# Patient Record
Sex: Male | Born: 1950
Health system: Southern US, Community
[De-identification: ages and names within clinical notes are randomized; demographics above are authoritative.]

## PROBLEM LIST (undated history)

## (undated) DIAGNOSIS — M545 Low back pain, unspecified: Secondary | ICD-10-CM

## (undated) DIAGNOSIS — E7800 Pure hypercholesterolemia, unspecified: Secondary | ICD-10-CM

## (undated) DIAGNOSIS — I255 Ischemic cardiomyopathy: Secondary | ICD-10-CM

## (undated) DIAGNOSIS — C61 Malignant neoplasm of prostate: Secondary | ICD-10-CM

## (undated) DIAGNOSIS — J302 Other seasonal allergic rhinitis: Secondary | ICD-10-CM

## (undated) DIAGNOSIS — Z8489 Family history of other specified conditions: Secondary | ICD-10-CM

## (undated) DIAGNOSIS — R351 Nocturia: Secondary | ICD-10-CM

## (undated) DIAGNOSIS — I5022 Chronic systolic (congestive) heart failure: Secondary | ICD-10-CM

## (undated) DIAGNOSIS — E119 Type 2 diabetes mellitus without complications: Secondary | ICD-10-CM

## (undated) DIAGNOSIS — Z860100 Personal history of colon polyps, unspecified: Secondary | ICD-10-CM

## (undated) DIAGNOSIS — R600 Localized edema: Secondary | ICD-10-CM

## (undated) DIAGNOSIS — D696 Thrombocytopenia, unspecified: Secondary | ICD-10-CM

## (undated) DIAGNOSIS — K5909 Other constipation: Secondary | ICD-10-CM

## (undated) DIAGNOSIS — I10 Essential (primary) hypertension: Secondary | ICD-10-CM

## (undated) DIAGNOSIS — E669 Obesity, unspecified: Secondary | ICD-10-CM

## (undated) DIAGNOSIS — I447 Left bundle-branch block, unspecified: Secondary | ICD-10-CM

## (undated) DIAGNOSIS — K5790 Diverticulosis of intestine, part unspecified, without perforation or abscess without bleeding: Secondary | ICD-10-CM

## (undated) DIAGNOSIS — IMO0001 Reserved for inherently not codable concepts without codable children: Secondary | ICD-10-CM

## (undated) DIAGNOSIS — N183 Chronic kidney disease, stage 3 unspecified: Secondary | ICD-10-CM

## (undated) DIAGNOSIS — G629 Polyneuropathy, unspecified: Secondary | ICD-10-CM

## (undated) DIAGNOSIS — Z95810 Presence of automatic (implantable) cardiac defibrillator: Secondary | ICD-10-CM

## (undated) DIAGNOSIS — Z87442 Personal history of urinary calculi: Secondary | ICD-10-CM

## (undated) DIAGNOSIS — J342 Deviated nasal septum: Secondary | ICD-10-CM

## (undated) DIAGNOSIS — K5730 Diverticulosis of large intestine without perforation or abscess without bleeding: Secondary | ICD-10-CM

## (undated) DIAGNOSIS — Z974 Presence of external hearing-aid: Secondary | ICD-10-CM

## (undated) DIAGNOSIS — I2510 Atherosclerotic heart disease of native coronary artery without angina pectoris: Secondary | ICD-10-CM

## (undated) DIAGNOSIS — N400 Enlarged prostate without lower urinary tract symptoms: Secondary | ICD-10-CM

## (undated) DIAGNOSIS — Z9289 Personal history of other medical treatment: Secondary | ICD-10-CM

## (undated) DIAGNOSIS — M1990 Unspecified osteoarthritis, unspecified site: Secondary | ICD-10-CM

## (undated) DIAGNOSIS — G4733 Obstructive sleep apnea (adult) (pediatric): Secondary | ICD-10-CM

## (undated) DIAGNOSIS — R350 Frequency of micturition: Secondary | ICD-10-CM

## (undated) DIAGNOSIS — G8929 Other chronic pain: Secondary | ICD-10-CM

## (undated) DIAGNOSIS — D171 Benign lipomatous neoplasm of skin and subcutaneous tissue of trunk: Secondary | ICD-10-CM

## (undated) DIAGNOSIS — Z9989 Dependence on other enabling machines and devices: Principal | ICD-10-CM

## (undated) DIAGNOSIS — E782 Mixed hyperlipidemia: Secondary | ICD-10-CM

## (undated) DIAGNOSIS — K5900 Constipation, unspecified: Secondary | ICD-10-CM

## (undated) DIAGNOSIS — N401 Enlarged prostate with lower urinary tract symptoms: Secondary | ICD-10-CM

## (undated) DIAGNOSIS — D751 Secondary polycythemia: Secondary | ICD-10-CM

## (undated) DIAGNOSIS — R609 Edema, unspecified: Secondary | ICD-10-CM

## (undated) DIAGNOSIS — J189 Pneumonia, unspecified organism: Secondary | ICD-10-CM

## (undated) DIAGNOSIS — I351 Nonrheumatic aortic (valve) insufficiency: Secondary | ICD-10-CM

## (undated) DIAGNOSIS — K219 Gastro-esophageal reflux disease without esophagitis: Secondary | ICD-10-CM

## (undated) DIAGNOSIS — J309 Allergic rhinitis, unspecified: Secondary | ICD-10-CM

## (undated) HISTORY — DX: Ischemic cardiomyopathy: I25.5

## (undated) HISTORY — DX: Allergic rhinitis, unspecified: J30.9

## (undated) HISTORY — PX: CARDIAC CATHETERIZATION: SHX172

## (undated) HISTORY — DX: Obstructive sleep apnea (adult) (pediatric): G47.33

## (undated) HISTORY — PX: FRACTURE SURGERY: SHX138

## (undated) HISTORY — DX: Reserved for inherently not codable concepts without codable children: IMO0001

## (undated) HISTORY — DX: Chronic systolic (congestive) heart failure: I50.22

## (undated) HISTORY — DX: Obesity, unspecified: E66.9

## (undated) HISTORY — DX: Essential (primary) hypertension: I10

## (undated) HISTORY — DX: Low back pain, unspecified: M54.50

## (undated) HISTORY — DX: Thrombocytopenia, unspecified: D69.6

## (undated) HISTORY — DX: Left bundle-branch block, unspecified: I44.7

## (undated) HISTORY — DX: Low back pain: M54.5

## (undated) HISTORY — DX: Dependence on other enabling machines and devices: Z99.89

## (undated) HISTORY — DX: Other chronic pain: G89.29

## (undated) HISTORY — DX: Pure hypercholesterolemia, unspecified: E78.00

## (undated) HISTORY — DX: Atherosclerotic heart disease of native coronary artery without angina pectoris: I25.10

## (undated) HISTORY — PX: COLONOSCOPY: SHX174

## (undated) HISTORY — DX: Benign lipomatous neoplasm of skin and subcutaneous tissue of trunk: D17.1

---

## 1999-05-08 ENCOUNTER — Encounter: Payer: Self-pay | Admitting: *Deleted

## 1999-05-08 ENCOUNTER — Emergency Department (HOSPITAL_COMMUNITY): Admission: EM | Admit: 1999-05-08 | Discharge: 1999-05-08 | Payer: Self-pay | Admitting: Emergency Medicine

## 2000-01-13 ENCOUNTER — Encounter: Payer: Self-pay | Admitting: Emergency Medicine

## 2000-01-13 ENCOUNTER — Encounter: Admission: RE | Admit: 2000-01-13 | Discharge: 2000-01-13 | Payer: Self-pay | Admitting: Emergency Medicine

## 2000-01-16 ENCOUNTER — Encounter: Payer: Self-pay | Admitting: Emergency Medicine

## 2000-01-16 ENCOUNTER — Encounter: Admission: RE | Admit: 2000-01-16 | Discharge: 2000-01-16 | Payer: Self-pay | Admitting: Emergency Medicine

## 2000-11-12 ENCOUNTER — Encounter: Admission: RE | Admit: 2000-11-12 | Discharge: 2001-02-10 | Payer: Self-pay | Admitting: Emergency Medicine

## 2001-01-23 ENCOUNTER — Encounter: Payer: Self-pay | Admitting: Emergency Medicine

## 2001-01-23 ENCOUNTER — Encounter: Admission: RE | Admit: 2001-01-23 | Discharge: 2001-01-23 | Payer: Self-pay | Admitting: Emergency Medicine

## 2008-02-14 HISTORY — PX: TIBIA FRACTURE SURGERY: SHX806

## 2008-02-14 HISTORY — PX: HUMERUS FRACTURE SURGERY: SHX670

## 2008-10-16 ENCOUNTER — Inpatient Hospital Stay (HOSPITAL_COMMUNITY): Admission: EM | Admit: 2008-10-16 | Discharge: 2008-10-28 | Payer: Self-pay

## 2008-10-16 HISTORY — PX: ORIF TIBIA PLATEAU: SHX2132

## 2008-10-20 ENCOUNTER — Ambulatory Visit: Payer: Self-pay | Admitting: Physical Medicine & Rehabilitation

## 2010-05-20 LAB — GLUCOSE, CAPILLARY
Glucose-Capillary: 102 mg/dL — ABNORMAL HIGH (ref 70–99)
Glucose-Capillary: 103 mg/dL — ABNORMAL HIGH (ref 70–99)
Glucose-Capillary: 103 mg/dL — ABNORMAL HIGH (ref 70–99)
Glucose-Capillary: 103 mg/dL — ABNORMAL HIGH (ref 70–99)
Glucose-Capillary: 105 mg/dL — ABNORMAL HIGH (ref 70–99)
Glucose-Capillary: 108 mg/dL — ABNORMAL HIGH (ref 70–99)
Glucose-Capillary: 108 mg/dL — ABNORMAL HIGH (ref 70–99)
Glucose-Capillary: 108 mg/dL — ABNORMAL HIGH (ref 70–99)
Glucose-Capillary: 109 mg/dL — ABNORMAL HIGH (ref 70–99)
Glucose-Capillary: 109 mg/dL — ABNORMAL HIGH (ref 70–99)
Glucose-Capillary: 110 mg/dL — ABNORMAL HIGH (ref 70–99)
Glucose-Capillary: 113 mg/dL — ABNORMAL HIGH (ref 70–99)
Glucose-Capillary: 113 mg/dL — ABNORMAL HIGH (ref 70–99)
Glucose-Capillary: 113 mg/dL — ABNORMAL HIGH (ref 70–99)
Glucose-Capillary: 115 mg/dL — ABNORMAL HIGH (ref 70–99)
Glucose-Capillary: 116 mg/dL — ABNORMAL HIGH (ref 70–99)
Glucose-Capillary: 120 mg/dL — ABNORMAL HIGH (ref 70–99)
Glucose-Capillary: 120 mg/dL — ABNORMAL HIGH (ref 70–99)
Glucose-Capillary: 123 mg/dL — ABNORMAL HIGH (ref 70–99)
Glucose-Capillary: 124 mg/dL — ABNORMAL HIGH (ref 70–99)
Glucose-Capillary: 125 mg/dL — ABNORMAL HIGH (ref 70–99)
Glucose-Capillary: 127 mg/dL — ABNORMAL HIGH (ref 70–99)
Glucose-Capillary: 132 mg/dL — ABNORMAL HIGH (ref 70–99)
Glucose-Capillary: 133 mg/dL — ABNORMAL HIGH (ref 70–99)
Glucose-Capillary: 146 mg/dL — ABNORMAL HIGH (ref 70–99)
Glucose-Capillary: 72 mg/dL (ref 70–99)
Glucose-Capillary: 85 mg/dL (ref 70–99)
Glucose-Capillary: 95 mg/dL (ref 70–99)
Glucose-Capillary: 97 mg/dL (ref 70–99)

## 2010-05-20 LAB — CBC
HCT: 38.1 % — ABNORMAL LOW (ref 39.0–52.0)
HCT: 38.7 % — ABNORMAL LOW (ref 39.0–52.0)
HCT: 46.3 % (ref 39.0–52.0)
Hemoglobin: 12.9 g/dL — ABNORMAL LOW (ref 13.0–17.0)
MCHC: 33.9 g/dL (ref 30.0–36.0)
MCHC: 34.7 g/dL (ref 30.0–36.0)
MCV: 85.9 fL (ref 78.0–100.0)
Platelets: 114 10*3/uL — ABNORMAL LOW (ref 150–400)
Platelets: 120 10*3/uL — ABNORMAL LOW (ref 150–400)
Platelets: 135 10*3/uL — ABNORMAL LOW (ref 150–400)
Platelets: 165 10*3/uL (ref 150–400)
RBC: 4.32 MIL/uL (ref 4.22–5.81)
RBC: 5.39 MIL/uL (ref 4.22–5.81)
RDW: 14.2 % (ref 11.5–15.5)
RDW: 14.3 % (ref 11.5–15.5)
RDW: 14.6 % (ref 11.5–15.5)
RDW: 14.9 % (ref 11.5–15.5)
WBC: 7.8 10*3/uL (ref 4.0–10.5)

## 2010-05-20 LAB — BASIC METABOLIC PANEL
BUN: 12 mg/dL (ref 6–23)
BUN: 9 mg/dL (ref 6–23)
CO2: 24 mEq/L (ref 19–32)
Calcium: 8.2 mg/dL — ABNORMAL LOW (ref 8.4–10.5)
Calcium: 8.6 mg/dL (ref 8.4–10.5)
Calcium: 8.8 mg/dL (ref 8.4–10.5)
Chloride: 103 mEq/L (ref 96–112)
Creatinine, Ser: 0.89 mg/dL (ref 0.4–1.5)
Creatinine, Ser: 1.12 mg/dL (ref 0.4–1.5)
GFR calc Af Amer: >60 mL/min (ref >60)
GFR calc non Af Amer: >60 mL/min (ref >60)
GFR calc non Af Amer: >60 mL/min (ref >60)
GFR calc non Af Amer: >60 mL/min (ref >60)
Glucose, Bld: 109 mg/dL — ABNORMAL HIGH (ref 70–99)
Glucose, Bld: 122 mg/dL — ABNORMAL HIGH (ref 70–99)
Glucose, Bld: 126 mg/dL — ABNORMAL HIGH (ref 70–99)
Potassium: 3.4 mEq/L — ABNORMAL LOW (ref 3.5–5.1)
Potassium: 3.4 mEq/L — ABNORMAL LOW (ref 3.5–5.1)
Sodium: 141 mEq/L (ref 135–145)

## 2010-05-20 LAB — URINALYSIS, ROUTINE W REFLEX MICROSCOPIC
Bilirubin Urine: NEGATIVE
Bilirubin Urine: NEGATIVE
Glucose, UA: NEGATIVE mg/dL
Hgb urine dipstick: NEGATIVE
Hgb urine dipstick: NEGATIVE
Protein, ur: NEGATIVE mg/dL
Specific Gravity, Urine: 1.023 (ref 1.005–1.030)
Urobilinogen, UA: 0.2 mg/dL (ref 0.0–1.0)
Urobilinogen, UA: 0.2 mg/dL (ref 0.0–1.0)

## 2010-05-20 LAB — HEMOGLOBIN A1C: Hgb A1c MFr Bld: 6.1 % (ref 4.6–6.1)

## 2010-05-20 LAB — POCT I-STAT, CHEM 8
BUN: 19 mg/dL (ref 6–23)
Creatinine, Ser: 1.2 mg/dL (ref 0.4–1.5)
Glucose, Bld: 121 mg/dL — ABNORMAL HIGH (ref 70–99)
Hemoglobin: 16.3 g/dL (ref 13.0–17.0)
Potassium: 3.3 meq/L — ABNORMAL LOW (ref 3.5–5.1)

## 2010-05-20 LAB — POCT I-STAT 4, (NA,K, GLUC, HGB,HCT): Hemoglobin: 14.6 g/dL (ref 13.0–17.0)

## 2010-05-20 LAB — SAMPLE TO BLOOD BANK

## 2010-05-20 LAB — LACTIC ACID, PLASMA: Lactic Acid, Venous: 1.8 mmol/L (ref 0.5–2.2)

## 2011-02-14 HISTORY — PX: KNEE ARTHROSCOPY: SHX127

## 2012-08-14 ENCOUNTER — Other Ambulatory Visit: Payer: Self-pay | Admitting: Interventional Cardiology

## 2012-08-15 ENCOUNTER — Encounter (HOSPITAL_COMMUNITY)
Admission: RE | Disposition: A | Discharge status: Home / Self Care | Payer: Self-pay | Source: Ambulatory Visit | Attending: Interventional Cardiology

## 2012-08-15 ENCOUNTER — Encounter (HOSPITAL_BASED_OUTPATIENT_CLINIC_OR_DEPARTMENT_OTHER): Admission: RE | Payer: Self-pay | Source: Ambulatory Visit | Attending: Interventional Cardiology

## 2012-08-15 ENCOUNTER — Inpatient Hospital Stay (HOSPITAL_BASED_OUTPATIENT_CLINIC_OR_DEPARTMENT_OTHER)
Admission: RE | Admit: 2012-08-15 | Discharge: 2012-08-15 | Disposition: A | Discharge status: Other Acute Inpatient Hospital | Payer: BC Managed Care – PPO | Source: Ambulatory Visit | Attending: Interventional Cardiology | Admitting: Interventional Cardiology

## 2012-08-15 ENCOUNTER — Ambulatory Visit (HOSPITAL_COMMUNITY)
Admission: RE | Admit: 2012-08-15 | Discharge: 2012-08-15 | Disposition: A | Discharge status: Home / Self Care | Payer: BC Managed Care – PPO | Source: Ambulatory Visit | Attending: Interventional Cardiology | Admitting: Interventional Cardiology

## 2012-08-15 ENCOUNTER — Encounter (HOSPITAL_COMMUNITY): Payer: Self-pay | Admitting: Pharmacy Technician

## 2012-08-15 DIAGNOSIS — I2510 Atherosclerotic heart disease of native coronary artery without angina pectoris: Secondary | ICD-10-CM | POA: Insufficient documentation

## 2012-08-15 DIAGNOSIS — E785 Hyperlipidemia, unspecified: Secondary | ICD-10-CM | POA: Insufficient documentation

## 2012-08-15 DIAGNOSIS — R9439 Abnormal result of other cardiovascular function study: Secondary | ICD-10-CM

## 2012-08-15 DIAGNOSIS — I10 Essential (primary) hypertension: Secondary | ICD-10-CM | POA: Insufficient documentation

## 2012-08-15 DIAGNOSIS — E119 Type 2 diabetes mellitus without complications: Secondary | ICD-10-CM | POA: Insufficient documentation

## 2012-08-15 DIAGNOSIS — I519 Heart disease, unspecified: Secondary | ICD-10-CM | POA: Insufficient documentation

## 2012-08-15 DIAGNOSIS — Z8249 Family history of ischemic heart disease and other diseases of the circulatory system: Secondary | ICD-10-CM | POA: Insufficient documentation

## 2012-08-15 DIAGNOSIS — I5022 Chronic systolic (congestive) heart failure: Secondary | ICD-10-CM | POA: Diagnosis present

## 2012-08-15 DIAGNOSIS — E669 Obesity, unspecified: Secondary | ICD-10-CM | POA: Insufficient documentation

## 2012-08-15 HISTORY — PX: LEFT HEART CATHETERIZATION WITH CORONARY ANGIOGRAM: SHX5451

## 2012-08-15 LAB — GLUCOSE, CAPILLARY: Glucose-Capillary: 125 mg/dL — ABNORMAL HIGH (ref 70–99)

## 2012-08-15 SURGERY — LEFT HEART CATHETERIZATION WITH CORONARY ANGIOGRAM
Anesthesia: LOCAL

## 2012-08-15 SURGERY — JV LEFT HEART CATHETERIZATION WITH CORONARY ANGIOGRAM
Anesthesia: Moderate Sedation

## 2012-08-15 MED ORDER — NITROGLYCERIN 0.2 MG/ML ON CALL CATH LAB
INTRAVENOUS | Status: AC
  Filled 2012-08-15: qty 1

## 2012-08-15 MED ORDER — VERAPAMIL HCL 2.5 MG/ML IV SOLN
INTRAVENOUS | Status: AC
  Filled 2012-08-15: qty 2

## 2012-08-15 MED ORDER — ONDANSETRON HCL 4 MG/2ML IJ SOLN: 4.0000 mg | Freq: Four times a day (QID) | INTRAMUSCULAR | Status: DC | PRN

## 2012-08-15 MED ORDER — MIDAZOLAM HCL 2 MG/2ML IJ SOLN
INTRAMUSCULAR | Status: AC
  Filled 2012-08-15: qty 2

## 2012-08-15 MED ORDER — SODIUM CHLORIDE 0.9 % IV SOLN
INTRAVENOUS | Status: DC
  Administered 2012-08-15: 09:00:00 via INTRAVENOUS

## 2012-08-15 MED ORDER — LISINOPRIL 20 MG PO TABS: 20.0000 mg | ORAL_TABLET | Freq: Every day | ORAL | Status: DC

## 2012-08-15 MED ORDER — FENTANYL CITRATE 0.05 MG/ML IJ SOLN
INTRAMUSCULAR | Status: AC
  Filled 2012-08-15: qty 2

## 2012-08-15 MED ORDER — SODIUM CHLORIDE 0.9 % IJ SOLN: 3.0000 mL | INTRAMUSCULAR | Status: DC | PRN

## 2012-08-15 MED ORDER — ASPIRIN 81 MG PO CHEW
CHEWABLE_TABLET | ORAL | Status: AC
  Filled 2012-08-15: qty 4

## 2012-08-15 MED ORDER — ASPIRIN 81 MG PO CHEW
324.0000 mg | CHEWABLE_TABLET | ORAL | Status: AC
  Administered 2012-08-15: 324 mg via ORAL

## 2012-08-15 MED ORDER — OXYCODONE-ACETAMINOPHEN 5-325 MG PO TABS: 1.0000 | ORAL_TABLET | ORAL | Status: DC | PRN

## 2012-08-15 MED ORDER — LISINOPRIL 20 MG PO TABS
20.0000 mg | ORAL_TABLET | Freq: Every day | ORAL | Status: DC
  Filled 2012-08-15: qty 1

## 2012-08-15 MED ORDER — HEPARIN SODIUM (PORCINE) 1000 UNIT/ML IJ SOLN
INTRAMUSCULAR | Status: AC
  Filled 2012-08-15: qty 1

## 2012-08-15 MED ORDER — LIDOCAINE HCL (PF) 1 % IJ SOLN
INTRAMUSCULAR | Status: AC
  Filled 2012-08-15: qty 30

## 2012-08-15 MED ORDER — ACETAMINOPHEN 325 MG PO TABS: 650.0000 mg | ORAL_TABLET | ORAL | Status: DC | PRN

## 2012-08-15 MED ORDER — HEPARIN (PORCINE) IN NACL 2-0.9 UNIT/ML-% IJ SOLN
INTRAMUSCULAR | Status: AC
  Filled 2012-08-15: qty 1000

## 2012-08-15 MED ORDER — DIAZEPAM 5 MG PO TABS
ORAL_TABLET | ORAL | Status: AC
  Filled 2012-08-15: qty 1

## 2012-08-15 MED ORDER — METOPROLOL SUCCINATE ER 25 MG PO TB24: 25.0000 mg | ORAL_TABLET | Freq: Every day | ORAL | Status: DC

## 2012-08-15 MED ORDER — SODIUM CHLORIDE 0.9 % IV SOLN: INTRAVENOUS | Status: DC

## 2012-08-15 MED ORDER — SODIUM CHLORIDE 0.9 % IV SOLN: 250.0000 mL | INTRAVENOUS | Status: DC | PRN

## 2012-08-15 MED ORDER — SODIUM CHLORIDE 0.9 % IJ SOLN: 3.0000 mL | Freq: Two times a day (BID) | INTRAMUSCULAR | Status: DC

## 2012-08-15 MED ORDER — DIAZEPAM 5 MG PO TABS
5.0000 mg | ORAL_TABLET | ORAL | Status: AC
  Administered 2012-08-15: 5 mg via ORAL

## 2012-08-15 MED ORDER — POTASSIUM CHLORIDE CRYS ER 20 MEQ PO TBCR: 20.0000 meq | EXTENDED_RELEASE_TABLET | Freq: Every day | ORAL | Status: DC

## 2012-08-15 NOTE — H&P (Signed)
The patient is 62 years of age and is a 3-6 month history of exertional chest tightness and dyspnea. A myocardial perfusion study done within the past 10 days demonstrated an intermediate risk to high risk study with anterior and inferior infarct/ischemia and a reduced EF of 30-35%. The patient is limited by angina with class III symptoms. Is not on good antianginal therapy. He is diabetic, hypertensive, obese, hyperlipidemic, and has a family history of CAD.  After considering the nuclear study and the patient's symptoms were decided to perform coronary angiography to define anatomy.  Please see the patient's complete history and physical scan done from the records at [facility] cardiology.  The procedure and its risks including stroke, death, limb ischemia, myocardial infarction, emergency surgery, kidney injury, bleeding, allergy, among others were discussed in detail except above the patient.

## 2012-08-15 NOTE — CV Procedure (Signed)
Cath Lab Visit (complete for each Cath Lab visit)  Clinical Evaluation Leading to the Procedure:   ACS: no  Non-ACS:    Anginal Classification: CCS III  Anti-ischemic medical therapy: Minimal Therapy (1 class of medications)  Non-Invasive Test Results: Intermediate-risk stress test findings: cardiac mortality 1-3%/year  Prior CABG: No previous CABG

## 2012-08-15 NOTE — CV Procedure (Signed)
     Diagnostic Cardiac Catheterization Report  Kenneth Giles  62 y.o.  male 09-Jul-1950  Procedure Date:08/15/2012 Referring Physician: Merlene Laughter, M.D.  Primary Cardiologist::  HWB Leia Alf, M.D.  PROCEDURE:  Left heart catheterization with selective coronary angiography, left ventriculogram.  INDICATIONS:  High-risk myocardial perfusion study including decreased LV function and multizone ischemia. Class III angina.  The risks, benefits, and details of the procedure were explained to the patient.  The patient verbalized understanding and wanted to proceed.  Informed written consent was obtained.  PROCEDURE TECHNIQUE:  After Xylocaine anesthesia a 5 [place] Skin he sheath was placed in the right radial artery with a single anterior needle wall stick.   Coronary angiography was done using a 5 [place] JL 3.5 and 5 [place] JR 4 catheter.  Left ventriculography was done using 5 [place] JR 4 catheter.  Hand injection was performed.   CONTRAST:  Total of 110 cc.  COMPLICATIONS:  None.    HEMODYNAMICS:  Aortic pressure was 154/74 mmHg; LV pressure was 156/4 mmHg; LVEDP 26 mmHg.  There was no gradient between the left ventricle and aorta.    ANGIOGRAPHIC DATA:   The left main coronary artery is normal.  The left anterior descending artery is 85-90% proximal LAD before bifurcating into the second diagonal and continuation of the LAD. The distal territory beyond this Monday in the 1, 0, 0 stenosis is large.  The left circumflex artery is 80-90%.diffuse stenosis in the mid vessel before the origin of the first and second obtuse marginal branches which supply the inferolateral wall and are relatively small in diameter. May or may not be graftable.  The ramus intermedius artery contains proximal eccentric 65-75% stenosis just beyond its origin from the distal left main. This is a large vessel that extends down the anterolateral wall. The vessel is graftable..  The right coronary artery is  moderate in size. He gives origin to a large acute right ventricular branch. Several small distal branches arise after a small PDA origin. The vessel contains high-grade 70-90% obstruction in a segmental distribution within the mid vessel just above the bifurcation of the RCA into the acute marginal branch.[name]  LEFT VENTRICULOGRAM:  Left ventricular angiogram was done in the 30 RAO projection and revealed a dilated cavity with global hypokinesis with an estimated ejection fraction of 30% %.  LVEDP was 26 mmHg mmHg.  IMPRESSIONS:  1. Severe three-vessel coronary artery disease involving the proximal LAD, ramus intermedius, mid circumflex, and mid RCA.  2. Severe left ventricular dysfunction disproportionate to the severity of coronary disease although all territories are supplied by obstructed vessels. My suspicion is that there may be some nonischemic cardiomyopathic involvement.  3. Significant elevation of left ventricular end-diastolic pressure.   RECOMMENDATION:  TCT S. consultation. Anti-ischemic and heart failure therapy will be initiated. The patient will be discharged as an outpatient today but needs to see the surgeon within the next 5 days.[name]

## 2012-08-19 ENCOUNTER — Other Ambulatory Visit: Payer: Self-pay | Admitting: *Deleted

## 2012-08-19 ENCOUNTER — Encounter: Payer: BC Managed Care – PPO | Admitting: Surgery

## 2012-08-19 ENCOUNTER — Institutional Professional Consult (permissible substitution) (INDEPENDENT_AMBULATORY_CARE_PROVIDER_SITE_OTHER): Payer: BC Managed Care – PPO | Admitting: Cardiothoracic Surgery

## 2012-08-19 VITALS — BP 159/80 | HR 74 | Resp 20 | Ht 66.0 in | Wt 240.0 lb

## 2012-08-19 DIAGNOSIS — E119 Type 2 diabetes mellitus without complications: Secondary | ICD-10-CM

## 2012-08-19 DIAGNOSIS — I2510 Atherosclerotic heart disease of native coronary artery without angina pectoris: Secondary | ICD-10-CM | POA: Insufficient documentation

## 2012-08-19 DIAGNOSIS — I10 Essential (primary) hypertension: Secondary | ICD-10-CM

## 2012-08-19 NOTE — Patient Instructions (Signed)
Stop fish oil now Take metoprolol with sip of water on a.m. of surgery Stop glipizide last dose Wednesday, July 9
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
## 2012-08-21 ENCOUNTER — Encounter (HOSPITAL_COMMUNITY): Payer: Self-pay

## 2012-08-21 ENCOUNTER — Ambulatory Visit (HOSPITAL_COMMUNITY)
Admission: RE | Admit: 2012-08-21 | Discharge: 2012-08-21 | Disposition: A | Discharge status: Home / Self Care | Payer: BC Managed Care – PPO | Source: Ambulatory Visit | Attending: Cardiothoracic Surgery | Admitting: Cardiothoracic Surgery

## 2012-08-21 ENCOUNTER — Encounter (HOSPITAL_COMMUNITY)
Admission: RE | Admit: 2012-08-21 | Discharge: 2012-08-21 | Disposition: A | Discharge status: Home / Self Care | Payer: BC Managed Care – PPO | Source: Ambulatory Visit | Attending: Cardiothoracic Surgery | Admitting: Cardiothoracic Surgery

## 2012-08-21 VITALS — BP 168/81 | HR 63 | Temp 98.8°F | Resp 20 | Ht 67.0 in | Wt 244.6 lb

## 2012-08-21 DIAGNOSIS — Z01812 Encounter for preprocedural laboratory examination: Secondary | ICD-10-CM | POA: Insufficient documentation

## 2012-08-21 DIAGNOSIS — Z01810 Encounter for preprocedural cardiovascular examination: Secondary | ICD-10-CM

## 2012-08-21 DIAGNOSIS — I2510 Atherosclerotic heart disease of native coronary artery without angina pectoris: Secondary | ICD-10-CM | POA: Insufficient documentation

## 2012-08-21 DIAGNOSIS — Z01818 Encounter for other preprocedural examination: Secondary | ICD-10-CM | POA: Insufficient documentation

## 2012-08-21 HISTORY — DX: Localized edema: R60.0

## 2012-08-21 HISTORY — DX: Personal history of colonic polyps: Z86.010

## 2012-08-21 HISTORY — DX: Nocturia: R35.1

## 2012-08-21 HISTORY — DX: Benign prostatic hyperplasia without lower urinary tract symptoms: N40.0

## 2012-08-21 HISTORY — DX: Unspecified osteoarthritis, unspecified site: M19.90

## 2012-08-21 HISTORY — DX: Pneumonia, unspecified organism: J18.9

## 2012-08-21 HISTORY — DX: Diverticulosis of intestine, part unspecified, without perforation or abscess without bleeding: K57.90

## 2012-08-21 HISTORY — DX: Edema, unspecified: R60.9

## 2012-08-21 HISTORY — DX: Frequency of micturition: R35.0

## 2012-08-21 HISTORY — DX: Personal history of colon polyps, unspecified: Z86.0100

## 2012-08-21 HISTORY — DX: Polyneuropathy, unspecified: G62.9

## 2012-08-21 HISTORY — DX: Gastro-esophageal reflux disease without esophagitis: K21.9

## 2012-08-21 HISTORY — DX: Personal history of other medical treatment: Z92.89

## 2012-08-21 HISTORY — DX: Constipation, unspecified: K59.00

## 2012-08-21 LAB — URINALYSIS, ROUTINE W REFLEX MICROSCOPIC
Bilirubin Urine: NEGATIVE
Glucose, UA: 250 mg/dL — AB
Hgb urine dipstick: NEGATIVE
Ketones, ur: NEGATIVE mg/dL
Leukocytes, UA: NEGATIVE
Nitrite: NEGATIVE
Protein, ur: NEGATIVE mg/dL
Specific Gravity, Urine: 1.022 (ref 1.005–1.030)
Urobilinogen, UA: 1 mg/dL (ref 0.0–1.0)
pH: 5.5 (ref 5.0–8.0)

## 2012-08-21 LAB — BLOOD GAS, ARTERIAL
Acid-Base Excess: 0.2 mmol/L (ref 0.0–2.0)
Bicarbonate: 24.4 mEq/L — ABNORMAL HIGH (ref 20.0–24.0)
Drawn by: 344381
FIO2: 0.21 %
O2 Saturation: 98.3 %
Patient temperature: 98.6
TCO2: 25.6 mmol/L (ref 0–100)
pCO2 arterial: 39.7 mmHg (ref 35.0–45.0)
pH, Arterial: 7.405 (ref 7.350–7.450)
pO2, Arterial: 90.8 mmHg (ref 80.0–100.0)

## 2012-08-21 LAB — CBC
HCT: 44.2 % (ref 39.0–52.0)
Hemoglobin: 16 g/dL (ref 13.0–17.0)
MCH: 30.2 pg (ref 26.0–34.0)
MCHC: 36.2 g/dL — ABNORMAL HIGH (ref 30.0–36.0)
MCV: 83.4 fL (ref 78.0–100.0)
Platelets: 105 10*3/uL — ABNORMAL LOW (ref 150–400)
RBC: 5.3 MIL/uL (ref 4.22–5.81)
RDW: 13.6 % (ref 11.5–15.5)
WBC: 4.1 10*3/uL (ref 4.0–10.5)

## 2012-08-21 LAB — COMPREHENSIVE METABOLIC PANEL
ALT: 36 U/L (ref 0–53)
AST: 29 U/L (ref 0–37)
Albumin: 3.6 g/dL (ref 3.5–5.2)
Alkaline Phosphatase: 55 U/L (ref 39–117)
BUN: 13 mg/dL (ref 6–23)
CO2: 20 mEq/L (ref 19–32)
Calcium: 9.3 mg/dL (ref 8.4–10.5)
Chloride: 105 mEq/L (ref 96–112)
Creatinine, Ser: 0.85 mg/dL (ref 0.50–1.35)
GFR calc Af Amer: >90 mL/min (ref >90)
GFR calc non Af Amer: >90 mL/min (ref >90)
Glucose, Bld: 221 mg/dL — ABNORMAL HIGH (ref 70–99)
Potassium: 3.5 mEq/L (ref 3.5–5.1)
Sodium: 137 mEq/L (ref 135–145)
Total Bilirubin: 0.4 mg/dL (ref 0.3–1.2)
Total Protein: 6.6 g/dL (ref 6.0–8.3)

## 2012-08-21 LAB — ABO/RH: ABO/RH(D): A POS

## 2012-08-21 LAB — TYPE AND SCREEN
ABO/RH(D): A POS
Antibody Screen: NEGATIVE

## 2012-08-21 LAB — APTT: aPTT: 28 seconds (ref 24–37)

## 2012-08-21 LAB — HEMOGLOBIN A1C
Hgb A1c MFr Bld: 6.2 % — ABNORMAL HIGH (ref <5.7)
Mean Plasma Glucose: 131 mg/dL — ABNORMAL HIGH (ref <117)

## 2012-08-21 LAB — SURGICAL PCR SCREEN
MRSA, PCR: NEGATIVE
Staphylococcus aureus: NEGATIVE

## 2012-08-21 LAB — PROTIME-INR
INR: 0.98 (ref 0.00–1.49)
Prothrombin Time: 12.8 seconds (ref 11.6–15.2)

## 2012-08-21 MED ORDER — CHLORHEXIDINE GLUCONATE 4 % EX LIQD: 30.0000 mL | CUTANEOUS | Status: DC

## 2012-08-21 NOTE — Progress Notes (Signed)
VASCULAR LAB PRELIMINARY  PRELIMINARY  PRELIMINARY  PRELIMINARY  Pre-op Cardiac Surgery  Carotid Findings:  Bilateral:  Less than 39% ICA stenosis.  Vertebral artery flow is antegrade.      Upper Extremity Right Left  Brachial Pressures 174 Triphasic  177  Triphasic   Radial Waveforms Triphasic  Triphasic   Ulnar Waveforms Triphasic  Triphasic   Palmar Arch (Allen's Test) Within normal limits  Within normal limits     Lower  Extremity Right Left  Dorsalis Pedis 301  Biphasic  235  Biphasic   Anterior Tibial    Posterior Tibial 241  Biphasic  247  Biphasic   Ankle/Brachial Indices 1.7 1.4      ABIs may be falsely elevated.   Kenneth Giles, RVT 08/21/2012, 10:30 AM

## 2012-08-21 NOTE — Progress Notes (Signed)
08/21/12 0829  OBSTRUCTIVE SLEEP APNEA  Have you ever been diagnosed with sleep apnea through a sleep study? No  Do you snore loudly (loud enough to be heard through closed doors)?  0  Do you often feel tired, fatigued, or sleepy during the daytime? 0  Has anyone observed you stop breathing during your sleep? 0  Do you have, or are you being treated for high blood pressure? 1  BMI more than 35 kg/m2? 1  Age over 60 years old? 1  Neck circumference greater than 40 cm/18 inches? 1 (18 1/2)  Gender: 1  Obstructive Sleep Apnea Score 5  Score 4 or greater  Results sent to PCP

## 2012-08-21 NOTE — Progress Notes (Addendum)
Dr.Henry Katrinka Blazing is cardiologist-to request last office visit from last week and last ekg for comparisson  Stress test report in epic from 08-19-12  Heart cath report in epic from 08-15-12  Denies ever having an echo  Dr.Stoneking is Medical Md  Denies EKG or CXR within time frame needed for surgery  Dopplers to be done at 0900  PFT to be done on 08-22-12 at 1130

## 2012-08-21 NOTE — Pre-Procedure Instructions (Signed)
Kenneth Giles  08/21/2012   Your procedure is scheduled on:  Fri, July 11 @ 7:30 AM  Report to [facility] at 5:30 AM.  Call this number if you have problems the morning of surgery: [phone]   Remember:   Do not eat food or drink liquids after midnight.   Take these medicines the morning of surgery with A SIP OF WATER: Zyrtec(Cetirizine),Metoprolol(Toprol),and Omeprazole(Prilosec)               Stop taking your Fish Oil.No Goody's,Aleve,Ibuprofen,BC's,or any Herbal Medications   Do not wear jewelry.  Do not wear lotions, powders, or colognes. You may wear deodorant.  Men may shave face and neck.  Do not bring valuables to the hospital.  [facility] is not responsible                   for any belongings or valuables.  Contacts, dentures or bridgework may not be worn into surgery.  Leave suitcase in the car. After surgery it may be brought to your room.  For patients admitted to the hospital, checkout time is 11:00 AM the day of  discharge.   Special Instructions: Shower using CHG 2 nights before surgery and the night before surgery.  If you shower the day of surgery use CHG.  Use special wash - you have one bottle of CHG for all showers.  You should use approximately 1/3 of the bottle for each shower.   Please read over the following fact sheets that you were given: Pain Booklet, Coughing and Deep Breathing, Blood Transfusion Information, MRSA Information and Surgical Site Infection Prevention

## 2012-08-22 ENCOUNTER — Ambulatory Visit (HOSPITAL_COMMUNITY)
Admission: RE | Admit: 2012-08-22 | Discharge: 2012-08-22 | Disposition: A | Discharge status: Home / Self Care | Payer: BC Managed Care – PPO | Source: Ambulatory Visit | Attending: Cardiothoracic Surgery | Admitting: Cardiothoracic Surgery

## 2012-08-22 DIAGNOSIS — I447 Left bundle-branch block, unspecified: Secondary | ICD-10-CM | POA: Diagnosis present

## 2012-08-22 DIAGNOSIS — Z0271 Encounter for disability determination: Secondary | ICD-10-CM

## 2012-08-22 DIAGNOSIS — G8929 Other chronic pain: Secondary | ICD-10-CM | POA: Diagnosis present

## 2012-08-22 DIAGNOSIS — D62 Acute posthemorrhagic anemia: Secondary | ICD-10-CM | POA: Diagnosis not present

## 2012-08-22 DIAGNOSIS — I10 Essential (primary) hypertension: Secondary | ICD-10-CM | POA: Diagnosis present

## 2012-08-22 DIAGNOSIS — I2510 Atherosclerotic heart disease of native coronary artery without angina pectoris: Principal | ICD-10-CM | POA: Diagnosis present

## 2012-08-22 DIAGNOSIS — E8779 Other fluid overload: Secondary | ICD-10-CM | POA: Diagnosis not present

## 2012-08-22 DIAGNOSIS — J9819 Other pulmonary collapse: Secondary | ICD-10-CM | POA: Diagnosis not present

## 2012-08-22 DIAGNOSIS — I498 Other specified cardiac arrhythmias: Secondary | ICD-10-CM | POA: Diagnosis not present

## 2012-08-22 DIAGNOSIS — G609 Hereditary and idiopathic neuropathy, unspecified: Secondary | ICD-10-CM | POA: Diagnosis present

## 2012-08-22 DIAGNOSIS — E7800 Pure hypercholesterolemia, unspecified: Secondary | ICD-10-CM | POA: Diagnosis present

## 2012-08-22 DIAGNOSIS — K219 Gastro-esophageal reflux disease without esophagitis: Secondary | ICD-10-CM | POA: Diagnosis present

## 2012-08-22 DIAGNOSIS — J309 Allergic rhinitis, unspecified: Secondary | ICD-10-CM | POA: Diagnosis present

## 2012-08-22 DIAGNOSIS — Z01818 Encounter for other preprocedural examination: Secondary | ICD-10-CM

## 2012-08-22 DIAGNOSIS — E119 Type 2 diabetes mellitus without complications: Secondary | ICD-10-CM | POA: Diagnosis present

## 2012-08-22 DIAGNOSIS — M549 Dorsalgia, unspecified: Secondary | ICD-10-CM | POA: Diagnosis present

## 2012-08-22 DIAGNOSIS — E669 Obesity, unspecified: Secondary | ICD-10-CM | POA: Diagnosis present

## 2012-08-22 DIAGNOSIS — Z01810 Encounter for preprocedural cardiovascular examination: Secondary | ICD-10-CM

## 2012-08-22 DIAGNOSIS — Z01812 Encounter for preprocedural laboratory examination: Secondary | ICD-10-CM

## 2012-08-22 LAB — PULMONARY FUNCTION TEST

## 2012-08-22 MED ORDER — SODIUM CHLORIDE 0.9 % IV SOLN
INTRAVENOUS | Status: DC
  Filled 2012-08-22: qty 30

## 2012-08-22 MED ORDER — SODIUM CHLORIDE 0.9 % IV SOLN
INTRAVENOUS | Status: AC
  Administered 2012-08-23: 69.8 mL/h via INTRAVENOUS
  Filled 2012-08-22: qty 40

## 2012-08-22 MED ORDER — VANCOMYCIN HCL 10 G IV SOLR
1500.0000 mg | INTRAVENOUS | Status: AC
  Administered 2012-08-23: 1500 mg via INTRAVENOUS
  Filled 2012-08-22: qty 1500

## 2012-08-22 MED ORDER — EPINEPHRINE HCL 1 MG/ML IJ SOLN
0.5000 ug/min | INTRAVENOUS | Status: DC
  Filled 2012-08-22: qty 4

## 2012-08-22 MED ORDER — SODIUM CHLORIDE 0.9 % IV SOLN
INTRAVENOUS | Status: AC
  Administered 2012-08-23: 2.9 [IU]/h via INTRAVENOUS
  Filled 2012-08-22: qty 1

## 2012-08-22 MED ORDER — DEXMEDETOMIDINE HCL IN NACL 400 MCG/100ML IV SOLN
0.1000 ug/kg/h | INTRAVENOUS | Status: AC
  Administered 2012-08-23: 0.5 ug/kg/h via INTRAVENOUS
  Administered 2012-08-23: 12:00:00 via INTRAVENOUS
  Filled 2012-08-22: qty 100

## 2012-08-22 MED ORDER — METOPROLOL TARTRATE 12.5 MG HALF TABLET
12.5000 mg | ORAL_TABLET | Freq: Once | ORAL | Status: AC
  Administered 2012-08-23: 12.5 mg via ORAL
  Filled 2012-08-22: qty 1

## 2012-08-22 MED ORDER — DOPAMINE-DEXTROSE 3.2-5 MG/ML-% IV SOLN
2.0000 ug/kg/min | INTRAVENOUS | Status: AC
  Administered 2012-08-23: 3 ug/kg/min via INTRAVENOUS
  Filled 2012-08-22: qty 250

## 2012-08-22 MED ORDER — PHENYLEPHRINE HCL 10 MG/ML IJ SOLN
30.0000 ug/min | INTRAMUSCULAR | Status: AC
  Administered 2012-08-23: 10 ug/min via INTRAVENOUS
  Filled 2012-08-22: qty 2

## 2012-08-22 MED ORDER — DEXTROSE 5 % IV SOLN
750.0000 mg | INTRAVENOUS | Status: DC
  Filled 2012-08-22 (×2): qty 750

## 2012-08-22 MED ORDER — MAGNESIUM SULFATE 50 % IJ SOLN
40.0000 meq | INTRAMUSCULAR | Status: DC
  Filled 2012-08-22: qty 10

## 2012-08-22 MED ORDER — ALBUTEROL SULFATE (5 MG/ML) 0.5% IN NEBU
2.5000 mg | INHALATION_SOLUTION | Freq: Once | RESPIRATORY_TRACT | Status: AC
  Administered 2012-08-22: 2.5 mg via RESPIRATORY_TRACT

## 2012-08-22 MED ORDER — DEXTROSE 5 % IV SOLN
1.5000 g | INTRAVENOUS | Status: AC
  Administered 2012-08-23: 1.5 g via INTRAVENOUS
  Administered 2012-08-23: .75 g via INTRAVENOUS
  Filled 2012-08-22 (×2): qty 1.5

## 2012-08-22 MED ORDER — PAPAVERINE HCL 30 MG/ML IJ SOLN
INTRAMUSCULAR | Status: AC
  Administered 2012-08-23: 10:00:00
  Filled 2012-08-22: qty 2.5

## 2012-08-22 MED ORDER — NITROGLYCERIN IN D5W 200-5 MCG/ML-% IV SOLN
2.0000 ug/min | INTRAVENOUS | Status: DC
  Filled 2012-08-22: qty 250

## 2012-08-22 MED ORDER — METOPROLOL TARTRATE 12.5 MG HALF TABLET: 12.5000 mg | ORAL_TABLET | Freq: Once | ORAL | Status: DC

## 2012-08-22 MED ORDER — POTASSIUM CHLORIDE 2 MEQ/ML IV SOLN
80.0000 meq | INTRAVENOUS | Status: DC
  Filled 2012-08-22: qty 40

## 2012-08-22 NOTE — Progress Notes (Signed)
Anesthesia chart review: Patient is a 62 year old male scheduled for CABG with left radial artery harvest on 08/23/12 by Dr. Donata Clay. History includes CAD, obesity, DM2, HTN, left BBB, dyslipidemia, GERD, BPH, nephrolithiasis, chronic back pain, thrombocytopenia, diverticulosis, arthritis.  OSA screening score was 5. PCP is Dr. Pete Glatter.  Cardiologist is Dr. Verdis Prime.  He was recently evaluated for exertional angina and had an abnormal stress test.  He subsequently underwent a cardiac cath on 08/15/12 that showed severe three-vessel CAD involving the proximal LAD, ramus intermedius, and the circumflex, and mid RCA. Severe left ventricular dysfunction disproportion to the severity of coronary disease although all territories are supplied by obstructive vessels. Dr. Katrinka Blazing thought there may be some degree of nonischemic cardiomyopathy involvement. EF 30%. Significant elevation of left ventricular end-diastolic pressure (26 mmHg).  Preliminary carotid duplex on 08/21/12 showed < 39% ICA stenosis bilaterally, antegrade vertebral artery flow.  He is scheduled for PFTs today.  Preoperative EKG, CXR, and labs noted.  Cr 0.85.  PLT count 105K.  PT/PTT WNL.  A1C 6.2.  Anticipate that he can proceed from an anesthesia standpoint.  Velna Ochs [facility]/Anesthesiology Phone [phone] 08/22/2012 9:43 AM

## 2012-08-23 ENCOUNTER — Inpatient Hospital Stay (HOSPITAL_COMMUNITY): Payer: BC Managed Care – PPO

## 2012-08-23 ENCOUNTER — Inpatient Hospital Stay (HOSPITAL_COMMUNITY): Payer: BC Managed Care – PPO | Admitting: Certified Registered Nurse Anesthetist

## 2012-08-23 ENCOUNTER — Inpatient Hospital Stay (HOSPITAL_COMMUNITY)
Admission: RE | Admit: 2012-08-23 | Discharge: 2012-08-30 | DRG: 109 | Disposition: A | Discharge status: Home / Self Care | Payer: BC Managed Care – PPO | Source: Ambulatory Visit | Attending: Cardiothoracic Surgery | Admitting: Cardiothoracic Surgery

## 2012-08-23 ENCOUNTER — Encounter (HOSPITAL_COMMUNITY)
Admission: RE | Disposition: A | Discharge status: Home / Self Care | Payer: Self-pay | Source: Ambulatory Visit | Attending: Cardiothoracic Surgery

## 2012-08-23 ENCOUNTER — Encounter (HOSPITAL_COMMUNITY): Payer: Self-pay | Admitting: Vascular Surgery

## 2012-08-23 ENCOUNTER — Encounter (HOSPITAL_COMMUNITY): Payer: Self-pay | Admitting: Certified Registered Nurse Anesthetist

## 2012-08-23 DIAGNOSIS — I2510 Atherosclerotic heart disease of native coronary artery without angina pectoris: Secondary | ICD-10-CM

## 2012-08-23 DIAGNOSIS — Z951 Presence of aortocoronary bypass graft: Secondary | ICD-10-CM

## 2012-08-23 HISTORY — PX: RADIAL ARTERY HARVEST: SHX5067

## 2012-08-23 HISTORY — PX: INTRAOPERATIVE TRANSESOPHAGEAL ECHOCARDIOGRAM: SHX5062

## 2012-08-23 HISTORY — PX: CORONARY ARTERY BYPASS GRAFT: SHX141

## 2012-08-23 HISTORY — DX: Presence of aortocoronary bypass graft: Z95.1

## 2012-08-23 LAB — CREATININE, SERUM
Creatinine, Ser: 1.02 mg/dL (ref 0.50–1.35)
GFR calc Af Amer: >90 mL/min (ref >90)
GFR calc non Af Amer: 78 mL/min — ABNORMAL LOW (ref >90)

## 2012-08-23 LAB — POCT I-STAT 4, (NA,K, GLUC, HGB,HCT)
Glucose, Bld: 157 mg/dL — ABNORMAL HIGH (ref 70–99)
Glucose, Bld: 164 mg/dL — ABNORMAL HIGH (ref 70–99)
Glucose, Bld: 156 mg/dL — ABNORMAL HIGH (ref 70–99)
Glucose, Bld: 169 mg/dL — ABNORMAL HIGH (ref 70–99)
Glucose, Bld: 184 mg/dL — ABNORMAL HIGH (ref 70–99)
Glucose, Bld: 220 mg/dL — ABNORMAL HIGH (ref 70–99)
Glucose, Bld: 154 mg/dL — ABNORMAL HIGH (ref 70–99)
HCT: 45 % (ref 39.0–52.0)
HCT: 42 % (ref 39.0–52.0)
HCT: 37 % — ABNORMAL LOW (ref 39.0–52.0)
HCT: 36 % — ABNORMAL LOW (ref 39.0–52.0)
HCT: 38 % — ABNORMAL LOW (ref 39.0–52.0)
HCT: 34 % — ABNORMAL LOW (ref 39.0–52.0)
HCT: 37 % — ABNORMAL LOW (ref 39.0–52.0)
Hemoglobin: 15.3 g/dL (ref 13.0–17.0)
Hemoglobin: 14.3 g/dL (ref 13.0–17.0)
Hemoglobin: 12.6 g/dL — ABNORMAL LOW (ref 13.0–17.0)
Hemoglobin: 12.2 g/dL — ABNORMAL LOW (ref 13.0–17.0)
Hemoglobin: 12.9 g/dL — ABNORMAL LOW (ref 13.0–17.0)
Hemoglobin: 11.6 g/dL — ABNORMAL LOW (ref 13.0–17.0)
Hemoglobin: 12.6 g/dL — ABNORMAL LOW (ref 13.0–17.0)
Potassium: 3.8 mEq/L (ref 3.5–5.1)
Potassium: 4 mEq/L (ref 3.5–5.1)
Potassium: 3.5 mEq/L (ref 3.5–5.1)
Potassium: 3.9 mEq/L (ref 3.5–5.1)
Potassium: 4.4 mEq/L (ref 3.5–5.1)
Potassium: 3.1 mEq/L — ABNORMAL LOW (ref 3.5–5.1)
Potassium: 3.4 mEq/L — ABNORMAL LOW (ref 3.5–5.1)
Sodium: 144 mEq/L (ref 135–145)
Sodium: 142 mEq/L (ref 135–145)
Sodium: 141 mEq/L (ref 135–145)
Sodium: 143 mEq/L (ref 135–145)
Sodium: 143 mEq/L (ref 135–145)
Sodium: 144 mEq/L (ref 135–145)
Sodium: 146 mEq/L — ABNORMAL HIGH (ref 135–145)

## 2012-08-23 LAB — GLUCOSE, CAPILLARY
Glucose-Capillary: 154 mg/dL — ABNORMAL HIGH (ref 70–99)
Glucose-Capillary: 105 mg/dL — ABNORMAL HIGH (ref 70–99)
Glucose-Capillary: 111 mg/dL — ABNORMAL HIGH (ref 70–99)
Glucose-Capillary: 120 mg/dL — ABNORMAL HIGH (ref 70–99)

## 2012-08-23 LAB — POCT I-STAT 3, ART BLOOD GAS (G3+)
Acid-Base Excess: 2 mmol/L (ref 0.0–2.0)
Acid-base deficit: 2 mmol/L (ref 0.0–2.0)
Acid-base deficit: 3 mmol/L — ABNORMAL HIGH (ref 0.0–2.0)
Acid-base deficit: 2 mmol/L (ref 0.0–2.0)
Acid-base deficit: 2 mmol/L (ref 0.0–2.0)
Bicarbonate: 27.9 mEq/L — ABNORMAL HIGH (ref 20.0–24.0)
Bicarbonate: 23.9 mEq/L (ref 20.0–24.0)
Bicarbonate: 23.6 mEq/L (ref 20.0–24.0)
Bicarbonate: 24.1 mEq/L — ABNORMAL HIGH (ref 20.0–24.0)
Bicarbonate: 24.1 mEq/L — ABNORMAL HIGH (ref 20.0–24.0)
O2 Saturation: 100 %
O2 Saturation: 100 %
O2 Saturation: 96 %
O2 Saturation: 94 %
O2 Saturation: 88 %
Patient temperature: 36.2
Patient temperature: 37.8
Patient temperature: 38
TCO2: 29 mmol/L (ref 0–100)
TCO2: 25 mmol/L (ref 0–100)
TCO2: 25 mmol/L (ref 0–100)
TCO2: 25 mmol/L (ref 0–100)
TCO2: 26 mmol/L (ref 0–100)
pCO2 arterial: 49.5 mmHg — ABNORMAL HIGH (ref 35.0–45.0)
pCO2 arterial: 42.6 mmHg (ref 35.0–45.0)
pCO2 arterial: 45.2 mmHg — ABNORMAL HIGH (ref 35.0–45.0)
pCO2 arterial: 46.8 mmHg — ABNORMAL HIGH (ref 35.0–45.0)
pCO2 arterial: 48.2 mmHg — ABNORMAL HIGH (ref 35.0–45.0)
pH, Arterial: 7.36 (ref 7.350–7.450)
pH, Arterial: 7.357 (ref 7.350–7.450)
pH, Arterial: 7.321 — ABNORMAL LOW (ref 7.350–7.450)
pH, Arterial: 7.323 — ABNORMAL LOW (ref 7.350–7.450)
pH, Arterial: 7.312 — ABNORMAL LOW (ref 7.350–7.450)
pO2, Arterial: 225 mmHg — ABNORMAL HIGH (ref 80.0–100.0)
pO2, Arterial: 300 mmHg — ABNORMAL HIGH (ref 80.0–100.0)
pO2, Arterial: 89 mmHg (ref 80.0–100.0)
pO2, Arterial: 79 mmHg — ABNORMAL LOW (ref 80.0–100.0)
pO2, Arterial: 63 mmHg — ABNORMAL LOW (ref 80.0–100.0)

## 2012-08-23 LAB — CBC
HCT: 36.9 % — ABNORMAL LOW (ref 39.0–52.0)
HCT: 35.5 % — ABNORMAL LOW (ref 39.0–52.0)
Hemoglobin: 12.2 g/dL — ABNORMAL LOW (ref 13.0–17.0)
MCH: 28.8 pg (ref 26.0–34.0)
MCHC: 34.4 g/dL (ref 30.0–36.0)
MCV: 83.9 fL (ref 78.0–100.0)
Platelets: 91 10*3/uL — ABNORMAL LOW (ref 150–400)
Platelets: 82 10*3/uL — ABNORMAL LOW (ref 150–400)
RBC: 4.42 MIL/uL (ref 4.22–5.81)
RBC: 4.23 MIL/uL (ref 4.22–5.81)
RDW: 13.7 % (ref 11.5–15.5)
RDW: 13.8 % (ref 11.5–15.5)
WBC: 10.2 10*3/uL (ref 4.0–10.5)
WBC: 8.4 10*3/uL (ref 4.0–10.5)

## 2012-08-23 LAB — PROTIME-INR: INR: 1.4 (ref 0.00–1.49)

## 2012-08-23 LAB — POCT I-STAT, CHEM 8
BUN: 18 mg/dL (ref 6–23)
Calcium, Ion: 1.15 mmol/L (ref 1.13–1.30)
Chloride: 109 mEq/L (ref 96–112)
Creatinine, Ser: 1.1 mg/dL (ref 0.50–1.35)
Glucose, Bld: 123 mg/dL — ABNORMAL HIGH (ref 70–99)
HCT: 34 % — ABNORMAL LOW (ref 39.0–52.0)
Hemoglobin: 11.6 g/dL — ABNORMAL LOW (ref 13.0–17.0)
Potassium: 4 mEq/L (ref 3.5–5.1)
Sodium: 145 mEq/L (ref 135–145)
TCO2: 23 mmol/L (ref 0–100)

## 2012-08-23 LAB — HEMOGLOBIN AND HEMATOCRIT, BLOOD
HCT: 37.6 % — ABNORMAL LOW (ref 39.0–52.0)
Hemoglobin: 13.2 g/dL (ref 13.0–17.0)

## 2012-08-23 LAB — PLATELET COUNT: Platelets: 111 10*3/uL — ABNORMAL LOW (ref 150–400)

## 2012-08-23 LAB — APTT: aPTT: 30 seconds (ref 24–37)

## 2012-08-23 LAB — MAGNESIUM: Magnesium: 2 mg/dL (ref 1.5–2.5)

## 2012-08-23 SURGERY — CORONARY ARTERY BYPASS GRAFTING (CABG)
Anesthesia: General | Site: Chest | Wound class: Clean

## 2012-08-23 MED ORDER — LIDOCAINE HCL (CARDIAC) 20 MG/ML IV SOLN
INTRAVENOUS | Status: DC | PRN
  Administered 2012-08-23: 100 mg via INTRAVENOUS

## 2012-08-23 MED ORDER — TRAMADOL HCL 50 MG PO TABS
50.0000 mg | ORAL_TABLET | Freq: Four times a day (QID) | ORAL | Status: DC | PRN
  Administered 2012-08-26: 50 mg via ORAL
  Filled 2012-08-23 (×2): qty 1

## 2012-08-23 MED ORDER — BISACODYL 10 MG RE SUPP: 10.0000 mg | Freq: Every day | RECTAL | Status: DC

## 2012-08-23 MED ORDER — VANCOMYCIN HCL IN DEXTROSE 1-5 GM/200ML-% IV SOLN: 1000.0000 mg | Freq: Once | INTRAVENOUS | Status: DC

## 2012-08-23 MED ORDER — ASPIRIN 81 MG PO CHEW: 324.0000 mg | CHEWABLE_TABLET | Freq: Every day | ORAL | Status: DC

## 2012-08-23 MED ORDER — DOBUTAMINE IN D5W 4-5 MG/ML-% IV SOLN
2.5000 ug/kg/min | INTRAVENOUS | Status: AC
  Administered 2012-08-23: 3 ug/kg/min via INTRAVENOUS
  Filled 2012-08-23: qty 250

## 2012-08-23 MED ORDER — POTASSIUM CHLORIDE 10 MEQ/100ML IV SOLN
INTRAVENOUS | Status: DC | PRN
  Administered 2012-08-23 (×2): 10 meq via INTRAVENOUS

## 2012-08-23 MED ORDER — OXYCODONE HCL 5 MG PO TABS
5.0000 mg | ORAL_TABLET | ORAL | Status: DC | PRN
  Administered 2012-08-24 – 2012-08-28 (×21): 10 mg via ORAL
  Administered 2012-08-28: 5 mg via ORAL
  Administered 2012-08-28: 10 mg via ORAL
  Administered 2012-08-29: 5 mg via ORAL
  Administered 2012-08-29 (×2): 10 mg via ORAL
  Administered 2012-08-30: 5 mg via ORAL
  Administered 2012-08-30: 10 mg via ORAL
  Filled 2012-08-23 (×8): qty 2
  Filled 2012-08-23 (×2): qty 1
  Filled 2012-08-23 (×5): qty 2
  Filled 2012-08-23: qty 1
  Filled 2012-08-23 (×13): qty 2

## 2012-08-23 MED ORDER — BUDESONIDE-FORMOTEROL FUMARATE 160-4.5 MCG/ACT IN AERO
2.0000 | INHALATION_SPRAY | Freq: Two times a day (BID) | RESPIRATORY_TRACT | Status: DC
  Administered 2012-08-23 – 2012-08-30 (×12): 2 via RESPIRATORY_TRACT
  Filled 2012-08-23: qty 6

## 2012-08-23 MED ORDER — ASPIRIN EC 325 MG PO TBEC
325.0000 mg | DELAYED_RELEASE_TABLET | Freq: Every day | ORAL | Status: DC
  Administered 2012-08-24 – 2012-08-30 (×7): 325 mg via ORAL
  Filled 2012-08-23 (×7): qty 1

## 2012-08-23 MED ORDER — FENTANYL CITRATE 0.05 MG/ML IJ SOLN
INTRAMUSCULAR | Status: DC | PRN
  Administered 2012-08-23: 100 ug via INTRAVENOUS
  Administered 2012-08-23: 500 ug via INTRAVENOUS
  Administered 2012-08-23: 50 ug via INTRAVENOUS
  Administered 2012-08-23 (×2): 250 ug via INTRAVENOUS
  Administered 2012-08-23 (×2): 100 ug via INTRAVENOUS
  Administered 2012-08-23: 150 ug via INTRAVENOUS
  Administered 2012-08-23: 500 ug via INTRAVENOUS

## 2012-08-23 MED ORDER — MILRINONE IN DEXTROSE 20 MG/100ML IV SOLN
0.1250 ug/kg/min | INTRAVENOUS | Status: AC
  Administered 2012-08-23: 0.5 ug/kg/min via INTRAVENOUS
  Filled 2012-08-23: qty 100

## 2012-08-23 MED ORDER — DOCUSATE SODIUM 100 MG PO CAPS
200.0000 mg | ORAL_CAPSULE | Freq: Every day | ORAL | Status: DC
  Administered 2012-08-24 – 2012-08-30 (×7): 200 mg via ORAL
  Filled 2012-08-23 (×6): qty 2

## 2012-08-23 MED ORDER — LORATADINE 10 MG PO TABS
10.0000 mg | ORAL_TABLET | Freq: Every day | ORAL | Status: DC
  Administered 2012-08-24 – 2012-08-30 (×7): 10 mg via ORAL
  Filled 2012-08-23 (×7): qty 1

## 2012-08-23 MED ORDER — INSULIN REGULAR BOLUS VIA INFUSION
0.0000 [IU] | Freq: Three times a day (TID) | INTRAVENOUS | Status: DC
  Administered 2012-08-24: 2 [IU] via INTRAVENOUS
  Filled 2012-08-23: qty 10

## 2012-08-23 MED ORDER — DEXTROSE 5 % IV SOLN
0.0000 ug/min | INTRAVENOUS | Status: DC
  Filled 2012-08-23: qty 2

## 2012-08-23 MED ORDER — 0.9 % SODIUM CHLORIDE (POUR BTL) OPTIME
TOPICAL | Status: DC | PRN
  Administered 2012-08-23: 6000 mL

## 2012-08-23 MED ORDER — MAGNESIUM SULFATE 40 MG/ML IJ SOLN
4.0000 g | Freq: Once | INTRAMUSCULAR | Status: AC
  Administered 2012-08-23: 4 g via INTRAVENOUS
  Filled 2012-08-23: qty 100

## 2012-08-23 MED ORDER — LACTATED RINGERS IV SOLN: 500.0000 mL | Freq: Once | INTRAVENOUS | Status: DC | PRN

## 2012-08-23 MED ORDER — HEMOSTATIC AGENTS (NO CHARGE) OPTIME
TOPICAL | Status: DC | PRN
  Administered 2012-08-23: 1 via TOPICAL

## 2012-08-23 MED ORDER — DEXMEDETOMIDINE HCL IN NACL 400 MCG/100ML IV SOLN
0.4000 ug/kg/h | INTRAVENOUS | Status: DC
  Filled 2012-08-23: qty 100

## 2012-08-23 MED ORDER — BISACODYL 5 MG PO TBEC
10.0000 mg | DELAYED_RELEASE_TABLET | Freq: Every day | ORAL | Status: DC
  Administered 2012-08-24 – 2012-08-30 (×5): 10 mg via ORAL
  Filled 2012-08-23 (×2): qty 2
  Filled 2012-08-23 (×2): qty 1
  Filled 2012-08-23: qty 2

## 2012-08-23 MED ORDER — NITROGLYCERIN IN D5W 200-5 MCG/ML-% IV SOLN: 0.0000 ug/min | INTRAVENOUS | Status: DC

## 2012-08-23 MED ORDER — SODIUM CHLORIDE 0.45 % IV SOLN: INTRAVENOUS | Status: DC

## 2012-08-23 MED ORDER — ACETAMINOPHEN 500 MG PO TABS
1000.0000 mg | ORAL_TABLET | Freq: Four times a day (QID) | ORAL | Status: AC
  Administered 2012-08-23 – 2012-08-28 (×18): 1000 mg via ORAL
  Filled 2012-08-23 (×19): qty 2

## 2012-08-23 MED ORDER — SODIUM CHLORIDE 0.9 % IV SOLN
10.0000 g | Freq: Once | INTRAVENOUS | Status: DC
  Filled 2012-08-23: qty 40

## 2012-08-23 MED ORDER — LACTATED RINGERS IV SOLN
INTRAVENOUS | Status: DC | PRN
  Administered 2012-08-23: 07:00:00 via INTRAVENOUS

## 2012-08-23 MED ORDER — ALBUMIN HUMAN 5 % IV SOLN
250.0000 mL | INTRAVENOUS | Status: AC | PRN
  Administered 2012-08-23 (×2): 250 mL via INTRAVENOUS

## 2012-08-23 MED ORDER — METOPROLOL TARTRATE 1 MG/ML IV SOLN: 2.5000 mg | INTRAVENOUS | Status: DC | PRN

## 2012-08-23 MED ORDER — VECURONIUM BROMIDE 10 MG IV SOLR
INTRAVENOUS | Status: DC | PRN
  Administered 2012-08-23 (×3): 10 mg via INTRAVENOUS

## 2012-08-23 MED ORDER — SODIUM CHLORIDE 0.9 % IJ SOLN
3.0000 mL | Freq: Two times a day (BID) | INTRAMUSCULAR | Status: DC
  Administered 2012-08-24 – 2012-08-29 (×7): 3 mL via INTRAVENOUS

## 2012-08-23 MED ORDER — NITROGLYCERIN 0.2 MG/ML ON CALL CATH LAB
INTRAVENOUS | Status: DC | PRN
  Administered 2012-08-23: 80 ug via INTRAVENOUS
  Administered 2012-08-23 (×3): 120 ug via INTRAVENOUS
  Administered 2012-08-23: 80 ug via INTRAVENOUS

## 2012-08-23 MED ORDER — LEVALBUTEROL HCL 1.25 MG/0.5ML IN NEBU
1.2500 mg | INHALATION_SOLUTION | Freq: Three times a day (TID) | RESPIRATORY_TRACT | Status: DC
  Administered 2012-08-23 – 2012-08-25 (×5): 1.25 mg via RESPIRATORY_TRACT
  Filled 2012-08-23 (×8): qty 0.5

## 2012-08-23 MED ORDER — DEXMEDETOMIDINE HCL IN NACL 200 MCG/50ML IV SOLN
0.1000 ug/kg/h | INTRAVENOUS | Status: DC
  Filled 2012-08-23: qty 50

## 2012-08-23 MED ORDER — FAMOTIDINE IN NACL 20-0.9 MG/50ML-% IV SOLN
20.0000 mg | Freq: Two times a day (BID) | INTRAVENOUS | Status: AC
  Administered 2012-08-23: 20 mg via INTRAVENOUS

## 2012-08-23 MED ORDER — SODIUM CHLORIDE 0.9 % IJ SOLN: 3.0000 mL | INTRAMUSCULAR | Status: DC | PRN

## 2012-08-23 MED ORDER — POTASSIUM CHLORIDE 10 MEQ/50ML IV SOLN
10.0000 meq | Freq: Once | INTRAVENOUS | Status: AC
  Administered 2012-08-23: 10 meq via INTRAVENOUS

## 2012-08-23 MED ORDER — PROTAMINE SULFATE 10 MG/ML IV SOLN
INTRAVENOUS | Status: DC | PRN
  Administered 2012-08-23: 350 mg via INTRAVENOUS

## 2012-08-23 MED ORDER — ATORVASTATIN CALCIUM 40 MG PO TABS
40.0000 mg | ORAL_TABLET | Freq: Every day | ORAL | Status: DC
  Filled 2012-08-23: qty 1

## 2012-08-23 MED ORDER — METOPROLOL TARTRATE 25 MG/10 ML ORAL SUSPENSION
12.5000 mg | Freq: Two times a day (BID) | ORAL | Status: DC
  Filled 2012-08-23 (×3): qty 5

## 2012-08-23 MED ORDER — DEXTROSE 5 % IV SOLN: 1.5000 g | INTRAVENOUS | Status: DC

## 2012-08-23 MED ORDER — SODIUM CHLORIDE 0.9 % IV SOLN: 250.0000 mL | INTRAVENOUS | Status: DC

## 2012-08-23 MED ORDER — VANCOMYCIN HCL IN DEXTROSE 1-5 GM/200ML-% IV SOLN
1000.0000 mg | Freq: Two times a day (BID) | INTRAVENOUS | Status: AC
  Administered 2012-08-23 – 2012-08-24 (×3): 1000 mg via INTRAVENOUS
  Filled 2012-08-23 (×3): qty 200

## 2012-08-23 MED ORDER — LACTATED RINGERS IV SOLN: INTRAVENOUS | Status: DC

## 2012-08-23 MED ORDER — ONDANSETRON HCL 4 MG/2ML IJ SOLN
4.0000 mg | Freq: Four times a day (QID) | INTRAMUSCULAR | Status: DC | PRN
  Administered 2012-08-25 – 2012-08-30 (×2): 4 mg via INTRAVENOUS
  Filled 2012-08-23 (×2): qty 2

## 2012-08-23 MED ORDER — ACETAMINOPHEN 160 MG/5ML PO SOLN: 975.0000 mg | Freq: Four times a day (QID) | ORAL | Status: DC

## 2012-08-23 MED ORDER — ROCURONIUM BROMIDE 100 MG/10ML IV SOLN
INTRAVENOUS | Status: DC | PRN
  Administered 2012-08-23 (×2): 50 mg via INTRAVENOUS

## 2012-08-23 MED ORDER — FENOFIBRATE 160 MG PO TABS
160.0000 mg | ORAL_TABLET | Freq: Every day | ORAL | Status: DC
  Administered 2012-08-24 – 2012-08-30 (×7): 160 mg via ORAL
  Filled 2012-08-23 (×7): qty 1

## 2012-08-23 MED ORDER — PROPOFOL 10 MG/ML IV BOLUS
INTRAVENOUS | Status: DC | PRN
  Administered 2012-08-23: 140 mg via INTRAVENOUS

## 2012-08-23 MED ORDER — MIDAZOLAM HCL 5 MG/5ML IJ SOLN
INTRAMUSCULAR | Status: DC | PRN
  Administered 2012-08-23: 5 mg via INTRAVENOUS
  Administered 2012-08-23: 4 mg via INTRAVENOUS
  Administered 2012-08-23: 2 mg via INTRAVENOUS
  Administered 2012-08-23: 5 mg via INTRAVENOUS
  Administered 2012-08-23 (×2): 2 mg via INTRAVENOUS

## 2012-08-23 MED ORDER — MORPHINE SULFATE 2 MG/ML IJ SOLN
1.0000 mg | INTRAMUSCULAR | Status: AC | PRN
  Administered 2012-08-23: 2 mg via INTRAVENOUS
  Filled 2012-08-23: qty 1

## 2012-08-23 MED ORDER — MILRINONE IN DEXTROSE 20 MG/100ML IV SOLN
0.0000 ug/kg/min | INTRAVENOUS | Status: DC
  Administered 2012-08-23: 0.3 ug/kg/min via INTRAVENOUS
  Filled 2012-08-23 (×2): qty 100

## 2012-08-23 MED ORDER — LORATADINE 10 MG PO TABS
10.0000 mg | ORAL_TABLET | Freq: Every day | ORAL | Status: DC
  Filled 2012-08-23: qty 1

## 2012-08-23 MED ORDER — SODIUM CHLORIDE 0.9 % IJ SOLN
OROMUCOSAL | Status: DC | PRN
  Administered 2012-08-23 (×3): via TOPICAL

## 2012-08-23 MED ORDER — ACETAMINOPHEN 10 MG/ML IV SOLN
1000.0000 mg | Freq: Once | INTRAVENOUS | Status: AC
  Administered 2012-08-23: 1000 mg via INTRAVENOUS
  Filled 2012-08-23: qty 100

## 2012-08-23 MED ORDER — ALBUMIN HUMAN 5 % IV SOLN
INTRAVENOUS | Status: DC | PRN
  Administered 2012-08-23 (×4): via INTRAVENOUS

## 2012-08-23 MED ORDER — PANTOPRAZOLE SODIUM 40 MG PO TBEC
40.0000 mg | DELAYED_RELEASE_TABLET | Freq: Every day | ORAL | Status: DC
  Filled 2012-08-23: qty 1

## 2012-08-23 MED ORDER — SODIUM CHLORIDE 0.9 % IV SOLN: INTRAVENOUS | Status: DC

## 2012-08-23 MED ORDER — MORPHINE SULFATE 2 MG/ML IJ SOLN
2.0000 mg | INTRAMUSCULAR | Status: DC | PRN
  Administered 2012-08-23 – 2012-08-24 (×3): 4 mg via INTRAVENOUS
  Filled 2012-08-23 (×3): qty 2

## 2012-08-23 MED ORDER — POTASSIUM CHLORIDE 10 MEQ/50ML IV SOLN
10.0000 meq | INTRAVENOUS | Status: AC
  Administered 2012-08-23 (×3): 10 meq via INTRAVENOUS

## 2012-08-23 MED ORDER — SODIUM CHLORIDE 0.9 % IV SOLN
INTRAVENOUS | Status: DC
  Filled 2012-08-23 (×2): qty 1

## 2012-08-23 MED ORDER — LACTATED RINGERS IV SOLN
INTRAVENOUS | Status: DC | PRN
  Administered 2012-08-23 (×2): via INTRAVENOUS

## 2012-08-23 MED ORDER — HEPARIN SODIUM (PORCINE) 1000 UNIT/ML IJ SOLN
INTRAMUSCULAR | Status: DC | PRN
  Administered 2012-08-23: 5000 [IU] via INTRAVENOUS
  Administered 2012-08-23: 4000 [IU] via INTRAVENOUS
  Administered 2012-08-23: 21000 [IU] via INTRAVENOUS

## 2012-08-23 MED ORDER — ATORVASTATIN CALCIUM 40 MG PO TABS
40.0000 mg | ORAL_TABLET | Freq: Every day | ORAL | Status: DC
  Administered 2012-08-24 – 2012-08-29 (×6): 40 mg via ORAL
  Filled 2012-08-23 (×7): qty 1

## 2012-08-23 MED ORDER — FENOFIBRATE 160 MG PO TABS
160.0000 mg | ORAL_TABLET | Freq: Every day | ORAL | Status: DC
  Filled 2012-08-23: qty 1

## 2012-08-23 MED ORDER — METOPROLOL TARTRATE 12.5 MG HALF TABLET
12.5000 mg | ORAL_TABLET | Freq: Two times a day (BID) | ORAL | Status: DC
  Administered 2012-08-24 – 2012-08-25 (×3): 12.5 mg via ORAL
  Filled 2012-08-23 (×5): qty 1

## 2012-08-23 MED ORDER — SUCCINYLCHOLINE CHLORIDE 20 MG/ML IJ SOLN
INTRAMUSCULAR | Status: DC | PRN
  Administered 2012-08-23: 140 mg via INTRAVENOUS

## 2012-08-23 MED ORDER — MIDAZOLAM HCL 2 MG/2ML IJ SOLN: 2.0000 mg | INTRAMUSCULAR | Status: DC | PRN

## 2012-08-23 MED ORDER — METOCLOPRAMIDE HCL 5 MG/ML IJ SOLN
10.0000 mg | Freq: Four times a day (QID) | INTRAMUSCULAR | Status: AC
  Administered 2012-08-23 – 2012-08-24 (×4): 10 mg via INTRAVENOUS
  Filled 2012-08-23 (×4): qty 2

## 2012-08-23 MED ORDER — LACTATED RINGERS IV SOLN
INTRAVENOUS | Status: DC | PRN
  Administered 2012-08-23 (×2): via INTRAVENOUS

## 2012-08-23 MED ORDER — DEXTROSE 5 % IV SOLN
1.5000 g | Freq: Two times a day (BID) | INTRAVENOUS | Status: AC
  Administered 2012-08-23 – 2012-08-25 (×4): 1.5 g via INTRAVENOUS
  Filled 2012-08-23 (×4): qty 1.5

## 2012-08-23 SURGICAL SUPPLY — 132 items
ADAPTER CARDIO PERF ANTE/RETRO (ADAPTER) ×5 IMPLANT
ANTEGRADE CPLG (MISCELLANEOUS) ×5 IMPLANT
APPLIER CLIP 9.375 SM OPEN (CLIP) ×5
ATTRACTOMAT 16X20 MAGNETIC DRP (DRAPES) ×5 IMPLANT
BAG DECANTER FOR FLEXI CONT (MISCELLANEOUS) ×5 IMPLANT
BANDAGE ELASTIC 4 VELCRO ST LF (GAUZE/BANDAGES/DRESSINGS) ×10 IMPLANT
BANDAGE ELASTIC 6 VELCRO ST LF (GAUZE/BANDAGES/DRESSINGS) ×5 IMPLANT
BANDAGE GAUZE ELAST BULKY 4 IN (GAUZE/BANDAGES/DRESSINGS) ×10 IMPLANT
BASKET HEART  (ORDER IN 25'S) (MISCELLANEOUS) ×1
BASKET HEART (ORDER IN 25'S) (MISCELLANEOUS) ×1
BASKET HEART (ORDER IN 25S) (MISCELLANEOUS) ×3 IMPLANT
BLADE STERNUM SYSTEM 6 (BLADE) ×5 IMPLANT
BLADE SURG 12 STRL SS (BLADE) ×5 IMPLANT
BLADE SURG 15 STRL LF DISP TIS (BLADE) ×3 IMPLANT
BLADE SURG 15 STRL SS (BLADE) ×2
BLADE SURG ROTATE 9660 (MISCELLANEOUS) ×10 IMPLANT
CANISTER SUCTION 2500CC (MISCELLANEOUS) ×5 IMPLANT
CANNULA AORTIC HI-FLOW 6.5M20F (CANNULA) ×5 IMPLANT
CANNULA ARTERIAL NVNT 3/8 20FR (MISCELLANEOUS) ×5 IMPLANT
CANNULA ARTERIAL NVNT 3/8 22FR (MISCELLANEOUS) ×5 IMPLANT
CANNULA GUNDRY RCSP 15FR (MISCELLANEOUS) ×5 IMPLANT
CANNULA VENOUS LOW PROF 32X40 (CANNULA) ×5 IMPLANT
CANNULA VENOUS MAL SGL STG 40 (MISCELLANEOUS) IMPLANT
CANNULAE VENOUS MAL SGL STG 40 (MISCELLANEOUS)
CATH CPB KIT VANTRIGT (MISCELLANEOUS) ×5 IMPLANT
CATH ROBINSON RED A/P 18FR (CATHETERS) ×15 IMPLANT
CATH THORACIC 28FR (CATHETERS) IMPLANT
CATH THORACIC 28FR RT ANG (CATHETERS) IMPLANT
CATH THORACIC 36FR (CATHETERS) IMPLANT
CATH THORACIC 36FR RT ANG (CATHETERS) ×10 IMPLANT
CLIP APPLIE 9.375 SM OPEN (CLIP) ×3 IMPLANT
CLIP FOGARTY SPRING 6M (CLIP) ×5 IMPLANT
CLIP TI MEDIUM 24 (CLIP) IMPLANT
CLIP TI WIDE RED SMALL 24 (CLIP) ×5 IMPLANT
CLOTH BEACON ORANGE TIMEOUT ST (SAFETY) ×10 IMPLANT
COVER MAYO STAND STRL (DRAPES) ×5 IMPLANT
COVER SURGICAL LIGHT HANDLE (MISCELLANEOUS) ×5 IMPLANT
CRADLE DONUT ADULT HEAD (MISCELLANEOUS) ×5 IMPLANT
DRAIN CHANNEL 32F RND 10.7 FF (WOUND CARE) ×5 IMPLANT
DRAPE CARDIOVASCULAR INCISE (DRAPES) ×2
DRAPE EXTREMITY T 121X128X90 (DRAPE) ×5 IMPLANT
DRAPE PROXIMA HALF (DRAPES) ×10 IMPLANT
DRAPE SLUSH/WARMER DISC (DRAPES) ×5 IMPLANT
DRAPE SRG 135X102X78XABS (DRAPES) ×3 IMPLANT
DRESSING AQUACEL AQ EXTRA 4X5 (GAUZE/BANDAGES/DRESSINGS) ×5 IMPLANT
DRSG AQUACEL AG ADV 3.5X10 (GAUZE/BANDAGES/DRESSINGS) ×5 IMPLANT
DRSG COVADERM 4X14 (GAUZE/BANDAGES/DRESSINGS) IMPLANT
DRSG SORBAVIEW 2.5X4 SM (GAUZE/BANDAGES/DRESSINGS) ×5 IMPLANT
ELECT BLADE 4.0 EZ CLEAN MEGAD (MISCELLANEOUS) ×5
ELECT BLADE 6.5 EXT (BLADE) ×5 IMPLANT
ELECT CAUTERY BLADE 6.4 (BLADE) ×5 IMPLANT
ELECT REM PT RETURN 9FT ADLT (ELECTROSURGICAL) ×10
ELECTRODE BLDE 4.0 EZ CLN MEGD (MISCELLANEOUS) ×3 IMPLANT
ELECTRODE REM PT RTRN 9FT ADLT (ELECTROSURGICAL) ×6 IMPLANT
GEL ULTRASOUND 20GR AQUASONIC (MISCELLANEOUS) ×5 IMPLANT
GLOVE BIO SURGEON STRL SZ 6 (GLOVE) ×20 IMPLANT
GLOVE BIO SURGEON STRL SZ 6.5 (GLOVE) ×20 IMPLANT
GLOVE BIO SURGEON STRL SZ7.5 (GLOVE) ×15 IMPLANT
GLOVE BIO SURGEON STRL SZ8 (GLOVE) ×5 IMPLANT
GLOVE BIO SURGEONS STRL SZ 6.5 (GLOVE) ×5
GLOVE BIOGEL PI IND STRL 6.5 (GLOVE) ×12 IMPLANT
GLOVE BIOGEL PI IND STRL 7.0 (GLOVE) ×15 IMPLANT
GLOVE BIOGEL PI INDICATOR 6.5 (GLOVE) ×8
GLOVE BIOGEL PI INDICATOR 7.0 (GLOVE) ×10
GOWN STRL NON-REIN LRG LVL3 (GOWN DISPOSABLE) ×55 IMPLANT
HARMONIC SHEARS 14CM COAG (MISCELLANEOUS) ×5 IMPLANT
HEMOSTAT POWDER SURGIFOAM 1G (HEMOSTASIS) ×15 IMPLANT
HEMOSTAT SURGICEL 2X14 (HEMOSTASIS) ×5 IMPLANT
INSERT FOGARTY XLG (MISCELLANEOUS) IMPLANT
KIT BASIN OR (CUSTOM PROCEDURE TRAY) ×5 IMPLANT
KIT ROOM TURNOVER OR (KITS) ×10 IMPLANT
KIT SUCTION CATH 14FR (SUCTIONS) ×5 IMPLANT
KIT VASOVIEW W/TROCAR VH 2000 (KITS) ×5 IMPLANT
LEAD PACING MYOCARDI (MISCELLANEOUS) ×5 IMPLANT
MARKER GRAFT CORONARY BYPASS (MISCELLANEOUS) ×20 IMPLANT
NS IRRIG 1000ML POUR BTL (IV SOLUTION) ×30 IMPLANT
PACK OPEN HEART (CUSTOM PROCEDURE TRAY) ×5 IMPLANT
PAD ARMBOARD 7.5X6 YLW CONV (MISCELLANEOUS) ×20 IMPLANT
PAD DEFIB R2 (MISCELLANEOUS) ×5 IMPLANT
PAD ELECT DEFIB RADIOL ZOLL (MISCELLANEOUS) ×5 IMPLANT
PENCIL BUTTON HOLSTER BLD 10FT (ELECTRODE) ×5 IMPLANT
PUNCH AORTIC ROTATE 4.0MM (MISCELLANEOUS) IMPLANT
PUNCH AORTIC ROTATE 4.5MM 8IN (MISCELLANEOUS) ×5 IMPLANT
PUNCH AORTIC ROTATE 5MM 8IN (MISCELLANEOUS) IMPLANT
SET CARDIOPLEGIA MPS 5001102 (MISCELLANEOUS) ×5 IMPLANT
SPONGE GAUZE 4X4 12PLY (GAUZE/BANDAGES/DRESSINGS) ×15 IMPLANT
STAPLER VISISTAT 35W (STAPLE) ×5 IMPLANT
STOPCOCK 4 WAY LG BORE MALE ST (IV SETS) ×5 IMPLANT
SURGIFLO W/THROMBIN 8M KIT (HEMOSTASIS) ×5 IMPLANT
SUT BONE WAX W31G (SUTURE) ×5 IMPLANT
SUT MNCRL AB 4-0 PS2 18 (SUTURE) ×15 IMPLANT
SUT PROLENE 3 0 SH DA (SUTURE) IMPLANT
SUT PROLENE 3 0 SH1 36 (SUTURE) IMPLANT
SUT PROLENE 4 0 RB 1 (SUTURE) ×6
SUT PROLENE 4 0 SH DA (SUTURE) ×5 IMPLANT
SUT PROLENE 4-0 RB1 .5 CRCL 36 (SUTURE) ×9 IMPLANT
SUT PROLENE 5 0 C 1 36 (SUTURE) IMPLANT
SUT PROLENE 6 0 C 1 30 (SUTURE) IMPLANT
SUT PROLENE 6 0 CC (SUTURE) ×10 IMPLANT
SUT PROLENE 7 0 DA (SUTURE) IMPLANT
SUT PROLENE 7.0 RB 3 (SUTURE) ×15 IMPLANT
SUT PROLENE 8 0 BV175 6 (SUTURE) ×5 IMPLANT
SUT PROLENE BLUE 7 0 (SUTURE) ×10 IMPLANT
SUT SILK  1 MH (SUTURE)
SUT SILK 1 MH (SUTURE) IMPLANT
SUT SILK 2 0 SH CR/8 (SUTURE) ×5 IMPLANT
SUT SILK 3 0 SH CR/8 (SUTURE) ×5 IMPLANT
SUT STEEL 6MS V (SUTURE) ×10 IMPLANT
SUT STEEL SZ 6 DBL 3X14 BALL (SUTURE) ×20 IMPLANT
SUT VIC AB 1 CTX 36 (SUTURE) ×4
SUT VIC AB 1 CTX36XBRD ANBCTR (SUTURE) ×6 IMPLANT
SUT VIC AB 2-0 CT1 27 (SUTURE) ×6
SUT VIC AB 2-0 CT1 TAPERPNT 27 (SUTURE) ×9 IMPLANT
SUT VIC AB 2-0 CTX 27 (SUTURE) IMPLANT
SUT VIC AB 3-0 SH 27 (SUTURE)
SUT VIC AB 3-0 SH 27X BRD (SUTURE) IMPLANT
SUT VIC AB 3-0 X1 27 (SUTURE) IMPLANT
SUT VICRYL 4-0 PS2 18IN ABS (SUTURE) IMPLANT
SUTURE E-PAK OPEN HEART (SUTURE) ×5 IMPLANT
SYR 50ML SLIP (SYRINGE) IMPLANT
SYSTEM SAHARA CHEST DRAIN ATS (WOUND CARE) ×5 IMPLANT
TAPE CLOTH SURG 4X10 WHT LF (GAUZE/BANDAGES/DRESSINGS) ×5 IMPLANT
TAPE PAPER 3X10 WHT MICROPORE (GAUZE/BANDAGES/DRESSINGS) ×5 IMPLANT
TOWEL OR 17X24 6PK STRL BLUE (TOWEL DISPOSABLE) ×10 IMPLANT
TOWEL OR 17X26 10 PK STRL BLUE (TOWEL DISPOSABLE) ×10 IMPLANT
TRAY CATH LUMEN 1 20CM STRL (SET/KITS/TRAYS/PACK) ×10 IMPLANT
TRAY FOLEY IC TEMP SENS 14FR (CATHETERS) ×5 IMPLANT
TUBE SUCT INTRACARD DLP 20F (MISCELLANEOUS) ×5 IMPLANT
TUBING ART PRESS 48 MALE/FEM (TUBING) ×10 IMPLANT
TUBING INSUFFLATION 10FT LAP (TUBING) ×5 IMPLANT
UNDERPAD 30X30 INCONTINENT (UNDERPADS AND DIAPERS) ×10 IMPLANT
WATER STERILE IRR 1000ML POUR (IV SOLUTION) ×10 IMPLANT

## 2012-08-23 NOTE — Procedures (Signed)
Extubation Procedure Note  Patient Details:   Name: [name] DOB: 02-Jan-1951 MRN: [id number]   Airway Documentation:   Pt extubated to 4L [place]. No stridor noted. BBS equal and dim. Pt has minimal vocal commands.   Evaluation  O2 sats: stable throughout and currently acceptable Complications: No apparent complications Patient did tolerate procedure well. Bilateral Breath Sounds: Diminished     Christie Beckers 08/23/2012, 7:01 PM

## 2012-08-23 NOTE — Progress Notes (Signed)
Utilization Review Completed.Kenneth Giles T7/12/2012  

## 2012-08-23 NOTE — Progress Notes (Signed)
TCTS BRIEF SICU PROGRESS NOTE  Day of Surgery  S/P Procedure(s) (LRB): CORONARY ARTERY BYPASS GRAFTING times four using Right Greater Saphenous Vein Graft harvested endoscopically and Left Radail Artery Graft (N/A) INTRAOPERATIVE TRANSESOPHAGEAL ECHOCARDIOGRAM (N/A) RADIAL ARTERY HARVEST (Left)   Awake on vent, looks ready for extubation NSR w/ stable BP on milrinone Chest tube output low UOP adequate Labs okay  Plan: Continue routine early postop  [name] H 08/23/2012 6:58 PM

## 2012-08-23 NOTE — CV Procedure (Signed)
Intra-operative Transesophageal Echocardiography Note:  [name] is a 61 year old male with a history of type 2 diabetes, obesity, hypertension, and left bundle branch block who was recently evaluated for exertional angina and had an abnormal stress test. Cardiac catheterization on 08/15/2012 shows severe three-vessel coronary artery disease with left ventricular dysfunction. He is now scheduled to undergo coronary artery bypass grafting by Dr. Zenaida Niece try. Intraoperative transesophageal echocardiography was indicated to evaluate the right and left ventricular function, serve as a monitor for intraoperative volume status, and to assess for any valvular pathology.  The patient was brought to the operating room at [facility] and general anesthesia was induced without difficulty. Following endotracheal intubation and orogastric suctioning, the transesophageal echocardiography probe was inserted into the esophagus without difficulty.  Impression: Pre-bypass findings:  1. Aortic valve: The aortic valve was trileaflet. The leaflets opened normally. There was mild thickening of the leaflets. There was 1+ aortic insufficiency with a central jet.  2. Mitral valve: The mitral leaflets coapted well without prolapse or flail segments. There was trace mitral insufficiency and mild mitral annular calcification.  3. Left ventricle: There was moderate left ventricular hypertrophy which was concentric. Left ventricular end-diastolic wall thickness measured 1.19 cm. at the inferior wall and 1.22 cm in the anterior wall in the mid-papillary level of the short in the transgastric short axis view. The left ventricular cavity was enlarged and measured 5.42 cm at diastole at the mid-papillary short axis views well. There was  akinesis involving the septum and inferior wall. There was mild hypokinesis involving the anterior wall lateral wall. Ejection fraction was estimated at 45%. There was no thrombus noted in  the left ventricular apex.  4.. Right ventricle: The right ventricular size was with was normal. There was normal contractility of the right ventricular free wall and there was normal appearing right ventricular function.  5.  Tricuspid valve: The tricuspid valve appeared structurally normal and there was 1+ tricuspid insufficiency.  6. Interatrial septum: The interatrial septum was intact without evidence of patent foramen ovale or atrial septal defect by color Doppler or bubble study.  7.. Left atrium: There was no thrombus noted in the left atrium or left atrial appendage.  8.. Ascending aorta: There was mild to moderate calcification in the walls of the ascending aorta but no severe protruding atheromatous plaques noted. The ASD aortic root show well-defined sinotubular ridge and sinuses of Valsalva without effacement. There was no aneurysmal enlargement of the  aascending aorta  9. Descending aorta.: The descending aorta measured 2.45 cm and there was no atheromatous disease noted.  Post-bypass findings:  1. Aortic valve: The aortic valve was unchanged from the pre-bypass study. There was 1+ aortic insufficiency.  2.. Mitral valve: The mitral leaflets coapted well was trace mitral insufficiency and no change from the pre-bypass study.  3. Left ventricle: There was initially moderate to severe left ventricular dysfunction which was worse than the pre-bypass study upon separation from cardiopulmonary bypass. The patient was then placed on inotropic support   and cardio-pulmonary bypass was reinstituted. With separation from bypass the second time there was marked improvement in left ventricular function which appeared essentially unchanged from the pre-bypass study with akinesis of the septum and inferior wall and adequate contractility of the anterior wall lateral wall the ejection fraction estimated at 45-50%.  4.. Right ventricle: The right ventricular function appeared normal and was  unchanged from the pre-bypass study  Kenneth Brood, MD

## 2012-08-23 NOTE — Progress Notes (Signed)
  Echocardiogram Echocardiogram Transesophageal has been performed.  Kenneth Giles 08/23/2012, 8:21 AM

## 2012-08-23 NOTE — Transfer of Care (Signed)
Immediate Anesthesia Transfer of Care Note  Patient: Kenneth Giles  Procedure(s) Performed: Procedure(s): CORONARY ARTERY BYPASS GRAFTING times four using Right Greater Saphenous Vein Graft harvested endoscopically and Left Radail Artery Graft (N/A) INTRAOPERATIVE TRANSESOPHAGEAL ECHOCARDIOGRAM (N/A) RADIAL ARTERY HARVEST (Left)  Patient Location: SICU  Anesthesia Type:General  Level of Consciousness: unresponsive and Patient remains intubated per anesthesia plan  Airway & Oxygen Therapy: Patient remains intubated per anesthesia plan and Patient placed on Ventilator (see vital sign flow sheet for setting)  Post-op Assessment: Report given to PACU RN and Post -op Vital signs reviewed and stable  Post vital signs: Reviewed and stable  Complications: No apparent anesthesia complications

## 2012-08-23 NOTE — OR Nursing (Signed)
First call made to 2300 at 1332; spoke with Liborio Nixon.  Second call made to 2300 at 1400; spoke with Liborio Nixon.

## 2012-08-23 NOTE — Anesthesia Preprocedure Evaluation (Signed)
Anesthesia Evaluation  Patient identified by MRN, date of birth, ID band Patient awake    Reviewed: Allergy & Precautions, H&P , NPO status   Airway Mallampati: III      Dental  (+) Teeth Intact and Dental Advisory Given   Pulmonary          Cardiovascular Rhythm:Regular Rate:Normal     Neuro/Psych    GI/Hepatic   Endo/Other    Renal/GU      Musculoskeletal   Abdominal   Peds  Hematology   Anesthesia Other Findings   Reproductive/Obstetrics                           Anesthesia Physical Anesthesia Plan  ASA: III  Anesthesia Plan: General   Post-op Pain Management:    Induction: Intravenous  Airway Management Planned: Oral ETT  Additional Equipment: Arterial line, PA Cath, CVP and 3D TEE  Intra-op Plan:   Post-operative Plan:   Informed Consent: I have reviewed the patients History and Physical, chart, labs and discussed the procedure including the risks, benefits and alternatives for the proposed anesthesia with the patient or authorized representative who has indicated his/her understanding and acceptance.   Dental advisory given  Plan Discussed with: CRNA and Anesthesiologist  Anesthesia Plan Comments:         Anesthesia Quick Evaluation

## 2012-08-23 NOTE — Preoperative (Signed)
Beta Blockers   Reason not to administer Beta Blockers:Not Applicable  Pt took  Am metoprolol.

## 2012-08-23 NOTE — Progress Notes (Signed)
The patient was examined and preop studies reviewed. There has been no change from the prior exam and the patient is ready for surgery.  Plan CABG on [address] [place] today

## 2012-08-23 NOTE — Anesthesia Postprocedure Evaluation (Signed)
  Anesthesia Post-op Note  Patient: Kenneth Giles  Procedure(s) Performed: Procedure(s): CORONARY ARTERY BYPASS GRAFTING times four using Right Greater Saphenous Vein Graft harvested endoscopically and Left Radail Artery Graft (N/A) INTRAOPERATIVE TRANSESOPHAGEAL ECHOCARDIOGRAM (N/A) RADIAL ARTERY HARVEST (Left)  Patient Location: SICU  Anesthesia Type:General  Level of Consciousness: sedated and Patient remains intubated per anesthesia plan  Airway and Oxygen Therapy: Patient remains intubated per anesthesia plan and Patient placed on Ventilator (see vital sign flow sheet for setting)  Post-op Pain: none  Post-op Assessment: Post-op Vital signs reviewed and Patient's Cardiovascular Status Stable  Post-op Vital Signs: stable  Complications: No apparent anesthesia complications

## 2012-08-23 NOTE — Brief Op Note (Signed)
08/23/2012  12:23 PM  PATIENT:  Amparo Bristol  62 y.o. male  PRE-OPERATIVE DIAGNOSIS:  Coronary Artery Disease  POST-OPERATIVE DIAGNOSIS:  Coronary Artery Disease  PROCEDURE:   CORONARY ARTERY BYPASS GRAFTING x 4 (LIMA-LAD, SVG-D, SVG-Ramus, Left RA-RCA)  ENDOSCOPIC VEIN HARVEST RIGHT THIGH  LEFT RADIAL ARTERY HARVEST  SURGEON:  Kerin Perna, MD  ASSISTANT: Coral Ceo, PA-C  ANESTHESIA:   general  PATIENT CONDITION:  ICU - intubated and hemodynamically stable.  PRE-OPERATIVE WEIGHT: 111 kg

## 2012-08-23 NOTE — Progress Notes (Signed)
ANTIBIOTIC CONSULT NOTE - INITIAL  Pharmacy Consult for Vancomycin  Indication: Post-op for 48 hours  No Known Allergies  Patient Measurements: Height: 5\' 7"  (170.2 cm) Weight: 244 lb 9.6 oz (110.95 kg) IBW/kg (Calculated) : 66.1  Vital Signs: Temp: 98.3 F (36.8 C) (07/11 0558) Temp src: Oral (07/11 0558) BP: 182/85 mmHg (07/11 0558) Pulse Rate: 70 (07/11 0723) Intake/Output from previous day:   Intake/Output from this shift: Total I/O In: 4100 [I.V.:2600; Blood:750; IV Piggyback:750] Out: 3250 [Urine:2150; Blood:1100]  Labs:  Recent Labs  08/21/12 0848  08/23/12 1205 08/23/12 1254 08/23/12 1331  WBC 4.1  --   --   --   --   HGB 16.0  < > 13.2 12.9* 11.6*  PLT 105*  --  111*  --   --   CREATININE 0.85  --   --   --   --   < > = values in this interval not displayed. Estimated Creatinine Clearance: 109.9 ml/min (by C-G formula based on Cr of 0.85). No results found for this basename: VANCOTROUGH, Leodis Binet, VANCORANDOM, GENTTROUGH, GENTPEAK, GENTRANDOM, TOBRATROUGH, TOBRAPEAK, TOBRARND, AMIKACINPEAK, AMIKACINTROU, AMIKACIN,  in the last 72 hours   Microbiology: Recent Results (from the past 720 hour(s))  SURGICAL PCR SCREEN     Status: None   Collection Time    08/21/12  8:46 AM      Result Value Range Status   MRSA, PCR NEGATIVE  NEGATIVE Final   Staphylococcus aureus NEGATIVE  NEGATIVE Final   Comment:            The Xpert SA Assay (FDA     approved for NASAL specimens     in patients over 44 years of age),     is one component of     a comprehensive surveillance     program.  Test performance has     been validated by [employer] for patients greater     than or equal to 60 year old.     It is not intended     to diagnose infection nor to     guide or monitor treatment.    Medical History: Past Medical History  Diagnosis Date  . Hypercholesteremia     takes Lipitor daily  . Allergic rhinitis     takes Zyrtec daily  . Chronic low back pain      cause unknown  . Left bundle branch block   . Thrombocytopenia   . Nephrolithiasis   . H/O renal calculi     no treatment needed  . Lipoma of back   . CAD (coronary artery disease)   . Diabetes mellitus     takes Glipizide daily  . Constipation     Citrucel daily  . GERD (gastroesophageal reflux disease)     takes Omeprazole daily  . Hypertension     takes Metoprolol daily and Lisinopril   . Pneumonia     hx of about 10+yrs ago  . Headache(784.0)     rarely   . Peripheral neuropathy   . Peripheral edema     takes Lozol daily  . Arthritis     hands   . History of colon polyps   . Diverticulosis   . Urinary frequency   . Enlarged prostate     slightly  . Nocturia   . History of blood transfusion     no abnormal reaction noted    Assessment: 62 y/o M s/p CABG to  get Vancomycin post-op for 48 hours.   Goal of Therapy:  Prevention of infection  Plan:  -Vancomycin 1000 mg x 3 doses already in place for post-op -If further dosing beyond 48 hours is needed, please re-consult pharmacy  Thank you for allowing me to take part in this patient's care,  Abran Duke, PharmD Clinical Pharmacist Phone: [phone] Pager: [phone] 08/23/2012 3:13 PM

## 2012-08-24 ENCOUNTER — Inpatient Hospital Stay (HOSPITAL_COMMUNITY): Payer: BC Managed Care – PPO

## 2012-08-24 LAB — POCT I-STAT, CHEM 8
BUN: 16 mg/dL (ref 6–23)
Calcium, Ion: 1.17 mmol/L (ref 1.13–1.30)
Chloride: 104 mEq/L (ref 96–112)
Creatinine, Ser: 1.2 mg/dL (ref 0.50–1.35)
Glucose, Bld: 153 mg/dL — ABNORMAL HIGH (ref 70–99)
HCT: 35 % — ABNORMAL LOW (ref 39.0–52.0)
Hemoglobin: 11.9 g/dL — ABNORMAL LOW (ref 13.0–17.0)
Potassium: 3.7 mEq/L (ref 3.5–5.1)
Sodium: 143 mEq/L (ref 135–145)
TCO2: 26 mmol/L (ref 0–100)

## 2012-08-24 LAB — GLUCOSE, CAPILLARY
Glucose-Capillary: 120 mg/dL — ABNORMAL HIGH (ref 70–99)
Glucose-Capillary: 126 mg/dL — ABNORMAL HIGH (ref 70–99)
Glucose-Capillary: 130 mg/dL — ABNORMAL HIGH (ref 70–99)
Glucose-Capillary: 132 mg/dL — ABNORMAL HIGH (ref 70–99)
Glucose-Capillary: 140 mg/dL — ABNORMAL HIGH (ref 70–99)
Glucose-Capillary: 144 mg/dL — ABNORMAL HIGH (ref 70–99)
Glucose-Capillary: 159 mg/dL — ABNORMAL HIGH (ref 70–99)
Glucose-Capillary: 110 mg/dL — ABNORMAL HIGH (ref 70–99)
Glucose-Capillary: 119 mg/dL — ABNORMAL HIGH (ref 70–99)
Glucose-Capillary: 121 mg/dL — ABNORMAL HIGH (ref 70–99)
Glucose-Capillary: 122 mg/dL — ABNORMAL HIGH (ref 70–99)
Glucose-Capillary: 124 mg/dL — ABNORMAL HIGH (ref 70–99)
Glucose-Capillary: 125 mg/dL — ABNORMAL HIGH (ref 70–99)
Glucose-Capillary: 128 mg/dL — ABNORMAL HIGH (ref 70–99)
Glucose-Capillary: 131 mg/dL — ABNORMAL HIGH (ref 70–99)
Glucose-Capillary: 138 mg/dL — ABNORMAL HIGH (ref 70–99)
Glucose-Capillary: 141 mg/dL — ABNORMAL HIGH (ref 70–99)
Glucose-Capillary: 160 mg/dL — ABNORMAL HIGH (ref 70–99)

## 2012-08-24 LAB — BASIC METABOLIC PANEL
CO2: 26 mEq/L (ref 19–32)
Calcium: 8.2 mg/dL — ABNORMAL LOW (ref 8.4–10.5)
Creatinine, Ser: 1.15 mg/dL (ref 0.50–1.35)
Glucose, Bld: 124 mg/dL — ABNORMAL HIGH (ref 70–99)
Sodium: 145 mEq/L (ref 135–145)

## 2012-08-24 LAB — CBC
HCT: 34.6 % — ABNORMAL LOW (ref 39.0–52.0)
Hemoglobin: 11.9 g/dL — ABNORMAL LOW (ref 13.0–17.0)
MCH: 29.1 pg (ref 26.0–34.0)
MCH: 28.8 pg (ref 26.0–34.0)
MCV: 84.6 fL (ref 78.0–100.0)
MCV: 86 fL (ref 78.0–100.0)
Platelets: 79 10*3/uL — ABNORMAL LOW (ref 150–400)
RBC: 4.09 MIL/uL — ABNORMAL LOW (ref 4.22–5.81)
RBC: 4.2 MIL/uL — ABNORMAL LOW (ref 4.22–5.81)
RDW: 14.6 % (ref 11.5–15.5)

## 2012-08-24 MED ORDER — MORPHINE SULFATE 2 MG/ML IJ SOLN
2.0000 mg | INTRAMUSCULAR | Status: DC | PRN
  Administered 2012-08-25 (×2): 2 mg via INTRAVENOUS
  Filled 2012-08-24 (×2): qty 1

## 2012-08-24 MED ORDER — POTASSIUM CHLORIDE 10 MEQ/50ML IV SOLN
10.0000 meq | INTRAVENOUS | Status: AC | PRN
  Administered 2012-08-24 (×3): 10 meq via INTRAVENOUS
  Filled 2012-08-24: qty 150

## 2012-08-24 MED ORDER — INSULIN ASPART 100 UNIT/ML ~~LOC~~ SOLN
0.0000 [IU] | SUBCUTANEOUS | Status: DC
  Administered 2012-08-24 – 2012-08-25 (×4): 2 [IU] via SUBCUTANEOUS

## 2012-08-24 MED ORDER — PANTOPRAZOLE SODIUM 40 MG PO TBEC
40.0000 mg | DELAYED_RELEASE_TABLET | Freq: Every day | ORAL | Status: DC
  Administered 2012-08-24 – 2012-08-30 (×7): 40 mg via ORAL
  Filled 2012-08-24 (×6): qty 1

## 2012-08-24 MED ORDER — INSULIN DETEMIR 100 UNIT/ML ~~LOC~~ SOLN
24.0000 [IU] | Freq: Two times a day (BID) | SUBCUTANEOUS | Status: DC
  Administered 2012-08-24 – 2012-08-25 (×4): 24 [IU] via SUBCUTANEOUS
  Filled 2012-08-24 (×6): qty 0.24

## 2012-08-24 MED ORDER — FUROSEMIDE 10 MG/ML IJ SOLN
20.0000 mg | Freq: Four times a day (QID) | INTRAMUSCULAR | Status: AC
  Administered 2012-08-24 (×2): 20 mg via INTRAVENOUS
  Filled 2012-08-24: qty 2

## 2012-08-24 MED ORDER — FUROSEMIDE 10 MG/ML IJ SOLN
INTRAMUSCULAR | Status: AC
  Administered 2012-08-24: 20 mg via INTRAVENOUS
  Filled 2012-08-24: qty 2

## 2012-08-24 MED ORDER — FUROSEMIDE 10 MG/ML IJ SOLN
INTRAMUSCULAR | Status: AC
  Filled 2012-08-24: qty 2

## 2012-08-24 MED ORDER — POTASSIUM CHLORIDE CRYS ER 20 MEQ PO TBCR
EXTENDED_RELEASE_TABLET | ORAL | Status: AC
  Administered 2012-08-24: 20 meq via ORAL
  Filled 2012-08-24: qty 1

## 2012-08-24 MED ORDER — POTASSIUM CHLORIDE CRYS ER 20 MEQ PO TBCR: 20.0000 meq | EXTENDED_RELEASE_TABLET | ORAL | Status: DC | PRN

## 2012-08-24 MED ORDER — POTASSIUM CHLORIDE CRYS ER 20 MEQ PO TBCR
20.0000 meq | EXTENDED_RELEASE_TABLET | ORAL | Status: AC
  Administered 2012-08-24 – 2012-08-25 (×2): 20 meq via ORAL
  Filled 2012-08-24 (×2): qty 1

## 2012-08-24 MED ORDER — ISOSORBIDE MONONITRATE ER 30 MG PO TB24
30.0000 mg | ORAL_TABLET | Freq: Every day | ORAL | Status: DC
  Administered 2012-08-24 – 2012-08-30 (×7): 30 mg via ORAL
  Filled 2012-08-24 (×7): qty 1

## 2012-08-24 NOTE — Progress Notes (Signed)
Pt placed on 6lpm [place]. Tolerating well at this time. Spo2 maintaining around 96%. RT will continue to monitor.

## 2012-08-24 NOTE — Progress Notes (Signed)
[facility] BRIEF SICU PROGRESS NOTE  1 Day Post-Op  S/P Procedure(s) (LRB): CORONARY ARTERY BYPASS GRAFTING times four using Right Greater Saphenous Vein Graft harvested endoscopically and Left Radail Artery Graft (N/A) INTRAOPERATIVE TRANSESOPHAGEAL ECHOCARDIOGRAM (N/A) RADIAL ARTERY HARVEST (Left)   Stable day NSR w/ stable BP O2 sats 96% on 5 L/min Diuresing reasonably well  Plan: Continue current plan  OWEN,CLARENCE H 08/24/2012 6:02 PM

## 2012-08-24 NOTE — Op Note (Signed)
Kenneth Giles, Kenneth Giles             ACCOUNT NO.:  [id number]  MEDICAL RECORD NO.:  [id number]  LOCATION:  2302                         FACILITY:  [facility]  PHYSICIAN:  Kerin Perna, M.D.  DATE OF BIRTH:  November 14, 1950  DATE OF PROCEDURE:  08/23/2012 DATE OF DISCHARGE:                              OPERATIVE REPORT   OPERATION: 1. Coronary artery bypass grafting x4 (left IMA to LAD, left radial     artery free graft to RCA, saphenous vein graft to OM 1, saphenous     vein graft to diagonal). 2. Harvest of left radial artery free graft from left forearm. 3. Endoscopic vein harvest of right leg greater saphenous vein from     thigh to knee.  PREOPERATIVE DIAGNOSIS:  Severe three-vessel coronary artery disease with moderate left ventricular dysfunction and class 3 angina.  POSTOPERATIVE DIAGNOSIS:  Severe three-vessel coronary artery disease with moderate left ventricular dysfunction and class 3 angina.  SURGEON:  Kerin Perna, MD  ASSISTANT:  Coral Ceo, PA-C  ANESTHESIA:  General by Dr. Kipp Brood.  INDICATIONS:  The patient is a 62 year old obese, diabetic, who presents with increasing exertional dyspnea and chest discomfort.  Stress test was abnormal and cardiac catheterization demonstrated severe 3 vessel coronary artery disease with EF of 30-35% with inferior wall akinesia. The patient was referred for surgical revascularization.  I examined the patient in my office and reviewed the results of cardiac catheterization with the patient.  I discussed the indications and expected benefits of coronary artery bypass grafting for treatment of his severe multivessel coronary artery disease with reduced LV function.  I discussed the major aspects of surgery including the use of general anesthesia and cardiopulmonary bypass, the location of the surgical incisions, and the expected postoperative hospital recovery.  I reviewed with the patient the risks to him of coronary artery  bypass grafting including risks of stroke, MI, bleeding, blood transfusion requirement, pneumonia, infection, and death.  After reviewing these issues, he demonstrated his understanding and agreed to proceed with surgery under what I felt was an informed consent.  OPERATIVE FINDINGS: 1. Good quality conduit. 2. Severely diseased targets, intra myocardial OM1, intramyocardial     LAD. 3. No blood products required for this operation.  OPERATIVE PROCEDURE:  The patient was brought to the operating room and placed supine on the operating table.  General anesthesia was induced under invasive hemodynamic monitoring.  A transesophageal 2D echo probe was placed by the anesthesiologist which demonstrated moderate LV dysfunction but no valvular dysfunction.  The patient was prepped and draped as a sterile field and a proper time-out was performed.  The left arm was also prepped.  A left forearm incision was made and the radial artery was harvested as a free graft.  A distal pulse was documented in the left hand after clamping the radial artery with a vascular bulldog.  A sternal incision was made in the left internal mammary artery was harvested as a pedicle graft from its origin at the subclavian vessels.  A sternal retractor was then placed using the deep blades due to the patient's obese body habitus.  The pericardium was opened and suspended. Aorta was inspected and found  to be without calcified plaque and of normal size perhaps slightly dilated.  Pursestrings were placed in the ascending aorta and right atrium after the heparin was administered and ACT was documented as being therapeutic, and after the vein was harvested endoscopically from the right thigh the patient was cannulated and placed on cardiopulmonary bypass.  The coronary arteries were identified for grafting and the mammary artery and vein grafts were prepared for the distal anastomoses. Cardioplegia cannulae were placed  for both antegrade and retrograde cold blood cardioplegia.  The patient was cooled to 32 degrees and aortic crossclamp was applied.  A 1.2 L of cold blood cardioplegia was delivered in split doses between the antegrade aortic and retrograde coronary sinus catheters.  There was good cardioplegic arrest and septal temperature dropped less than 15 degrees.  Cardioplegia was delivered every 20 minutes while the crossclamp was in place.  The distal coronary anastomoses were then performed.  The first distal anastomosis was to the distal RCA.  This had a proximal 99% stenosis.  The radial artery was sewn end-to-side with running 8-0 Prolene with excellent flow through the graft.  Cardioplegia was redosed.  The second distal anastomosis was to the OM 1 branch of the left coronary.  This was an intramyocardial vessel.  It was 1.5-mm vessel proximal 80% stenosis.  A reverse saphenous vein was sewn end-to-side with running 7-0 Prolene with excellent flow through the graft. Cardioplegia was redosed.  The third distal anastomosis was to the second diagonal.  There was a calcified plaque at the origin of the diagonal from the LAD, which limited passing a 1.5-mm probe proximally.  A reverse saphenous vein was sewn end-to-side with running 7-0 Prolene with good flow through the graft.  Cardioplegia redosed.  The fourth distal anastomosis was to the mid LAD before it became intramyocardial.  The left IMA pedicle was brought through an opening and the left lateral pericardium was brought down onto the LAD and sewn end-to-side with running 8-0 Prolene.  There was excellent flow through the anastomosis after briefly releasing the bulldog on the pedicle.  The bulldog was reapplied and the pedicle secured to the epicardium with 6-0 Prolenes.  Cardioplegia was redosed.  The peripheral crossclamp was still in place, 3 proximal anastomoses were placed on the ascending aorta with a 4.0 mm punch and running  7-0 Prolene.  Prior to tying down the final proximal anastomosis-the radial artery graft to the RCA-air was vented from the coronaries with a dose of retrograde warm blood cardioplegia and then the crossclamp was removed.  The heart was cardioverted back to a regular rhythm.  The patient was rewarmed and reperfused.  Temporary pacing wires were applied.  The proximal and distal coronary anastomoses were checked and found to be hemostatic.  The patient was rewarmed.  The lungs were expanded and ventilator was resumed.  After proper reperfusion and rewarming, the patient was weaned off cardiopulmonary bypass on low-dose milrinone and dopamine.  Global LV function appeared improved.  There was a transient episode of hypotension due to probable intracoronary air but this was resolved with aspiration of a vein graft and with a vent placed in the ascending aorta after short period of cardiopulmonary bypass.  Protamine was administered and the cannula was removed.  Hemostasis was adequate and the ACT returned normal after reversal of the heparin.  The mediastinum was irrigated.  The superior pericardial fat was closed over the aorta.  The anterior mediastinal and left pleural chest tube were placed  and brought out through separate incisions.  The sternum was closed with interrupted double gauge steel wire.  The pectoralis fascia was closed with a running #1 Vicryl.  The subcutaneous and skin layers were closed in running Vicryl.  Sterile dressings were applied.  A femoral A-line was placed in the left leg for blood pressure monitoring.  Total cardiopulmonary bypass time was 150 minutes.  The patient returned to the ICU in stable conditions.     Kerin Perna, M.D.     PV/MEDQ  D:  08/23/2012  T:  08/24/2012  Job:  [id number]  cc:   Kenneth Giles, M.D.

## 2012-08-24 NOTE — Progress Notes (Signed)
      [address]       [place] [address]             [phone]        CARDIOTHORACIC SURGERY PROGRESS NOTE   R1 Day Post-Op Procedure(s) (LRB): CORONARY ARTERY BYPASS GRAFTING times four using Right Greater Saphenous Vein Graft harvested endoscopically and Left Radail Artery Graft (N/A) INTRAOPERATIVE TRANSESOPHAGEAL ECHOCARDIOGRAM (N/A) RADIAL ARTERY HARVEST (Left)  Subjective: Feels sore all over.  Breathing comfortably.  Objective: Vital signs: BP Readings from Last 1 Encounters:  08/23/12 182/85   Pulse Readings from Last 1 Encounters:  08/24/12 89   Resp Readings from Last 1 Encounters:  08/24/12 21   Temp Readings from Last 1 Encounters:  08/24/12 99.5 F (37.5 C)     Hemodynamics: PAP: (20-44)/(11-29) 22/11 mmHg CO:  [6 L/min-9.8 L/min] 6.8 L/min CI:  [2.7 L/min/m2-4.5 L/min/m2] 3.1 L/min/m2  Physical Exam:  Rhythm:   sinus  Breath sounds: Shallow but clear  Heart sounds:  RRR  Incisions:  Dressing dry, intact  Abdomen:  Soft, non-distended, non-tender  Extremities:  Warm, well-perfused    Intake/Output from previous day: 07/11 0701 - 07/12 0700 In: 6428.6 [I.V.:3788.6; Blood:750; NG/GT:30; IV Piggyback:1860] Out: 6530 [Urine:4360; Emesis/NG output:350; Blood:1100; Chest Tube:720] Intake/Output this shift: Total I/O In: 209.3 [I.V.:59.3; IV Piggyback:150] Out: 235 [Urine:135; Chest Tube:100]  Lab Results:  Recent Labs  08/23/12 1900 08/23/12 1955 08/24/12 0430  WBC 8.4  --  10.0  HGB 12.2* 11.6* 11.9*  HCT 35.5* 34.0* 34.6*  PLT 82*  --  82*   BMET:  Recent Labs  08/23/12 1955 08/24/12 0430  NA 145 145  K 4.0 3.6  CL 109 110  CO2  --  26  GLUCOSE 123* 124*  BUN 18 16  CREATININE 1.10 1.15  CALCIUM  --  8.2*    CBG (last 3)   Recent Labs  08/23/12 1634 08/23/12 1739 08/23/12 1832  GLUCAP 105* 111* 120*   ABG    Component Value Date/Time   PHART 7.312* 08/23/2012 1951   HCO3 24.1* 08/23/2012 1951     TCO2 23 08/23/2012 1955   ACIDBASEDEF 2.0 08/23/2012 1951   O2SAT 88.0 08/23/2012 1951   CXR: *RADIOLOGY REPORT*  Clinical Data: Status post CABG.  PORTABLE CHEST - 1 VIEW  Comparison: 08/19/2012  Findings: The patient has been extubated. Nasogastric tube has  been removed. Left chest tube remains with no pneumothorax  visualized. Swan-Ganz catheter tip is in the proximal right  pulmonary artery. The lungs show decrease in volumes with  bilateral lower lobe atelectasis present, left greater than right.  No overt edema is identified. The heart size and mediastinal  contours are stable.  IMPRESSION:  Decreased lung volumes with bilateral lower lobe atelectasis, left  greater than right. No pneumothorax.  Original Report Authenticated By: Irish Lack, M.D.    Assessment/Plan: S/P Procedure(s) (LRB): CORONARY ARTERY BYPASS GRAFTING times four using Right Greater Saphenous Vein Graft harvested endoscopically and Left Radail Artery Graft (N/A) INTRAOPERATIVE TRANSESOPHAGEAL ECHOCARDIOGRAM (N/A) RADIAL ARTERY HARVEST (Left)  Overall stable POD1 Maintaining NSR w/ stable hemodynamics Expected post op acute blood loss anemia, mild, stable Expected post op volume excess Expected post op atelectasis Type II diabetes mellitus, excellent glycemic control on insulin drip   Mobilize  Diuresis  D/C lines  Wean milrinone off  Imdur for RA graft  Leave chest tubes until output decreased   Ivy Meriwether H 08/24/2012 9:52 AM

## 2012-08-25 ENCOUNTER — Inpatient Hospital Stay (HOSPITAL_COMMUNITY): Payer: BC Managed Care – PPO

## 2012-08-25 LAB — GLUCOSE, CAPILLARY
Glucose-Capillary: 115 mg/dL — ABNORMAL HIGH (ref 70–99)
Glucose-Capillary: 120 mg/dL — ABNORMAL HIGH (ref 70–99)
Glucose-Capillary: 123 mg/dL — ABNORMAL HIGH (ref 70–99)
Glucose-Capillary: 128 mg/dL — ABNORMAL HIGH (ref 70–99)
Glucose-Capillary: 143 mg/dL — ABNORMAL HIGH (ref 70–99)
Glucose-Capillary: 143 mg/dL — ABNORMAL HIGH (ref 70–99)
Glucose-Capillary: 159 mg/dL — ABNORMAL HIGH (ref 70–99)

## 2012-08-25 LAB — CBC
Hemoglobin: 11.8 g/dL — ABNORMAL LOW (ref 13.0–17.0)
Platelets: 80 10*3/uL — ABNORMAL LOW (ref 150–400)
RBC: 3.99 MIL/uL — ABNORMAL LOW (ref 4.22–5.81)
WBC: 9.7 10*3/uL (ref 4.0–10.5)

## 2012-08-25 LAB — BASIC METABOLIC PANEL
CO2: 30 mEq/L (ref 19–32)
Calcium: 8.3 mg/dL — ABNORMAL LOW (ref 8.4–10.5)
GFR calc non Af Amer: 79 mL/min — ABNORMAL LOW (ref >90)
Potassium: 3.9 mEq/L (ref 3.5–5.1)
Sodium: 139 mEq/L (ref 135–145)

## 2012-08-25 MED ORDER — METOPROLOL TARTRATE 25 MG PO TABS
25.0000 mg | ORAL_TABLET | Freq: Two times a day (BID) | ORAL | Status: DC
  Administered 2012-08-25 – 2012-08-26 (×3): 25 mg via ORAL
  Filled 2012-08-25 (×5): qty 1

## 2012-08-25 MED ORDER — FUROSEMIDE 10 MG/ML IJ SOLN
40.0000 mg | Freq: Once | INTRAMUSCULAR | Status: AC
  Administered 2012-08-25: 40 mg via INTRAVENOUS

## 2012-08-25 MED ORDER — SODIUM CHLORIDE 0.9 % IJ SOLN
3.0000 mL | Freq: Two times a day (BID) | INTRAMUSCULAR | Status: DC
  Administered 2012-08-25 – 2012-08-30 (×7): 3 mL via INTRAVENOUS

## 2012-08-25 MED ORDER — MOVING RIGHT ALONG BOOK
Freq: Once | Status: AC
  Administered 2012-08-25: 1
  Filled 2012-08-25: qty 1

## 2012-08-25 MED ORDER — SODIUM CHLORIDE 0.9 % IJ SOLN: 3.0000 mL | INTRAMUSCULAR | Status: DC | PRN

## 2012-08-25 MED ORDER — SODIUM CHLORIDE 0.9 % IV SOLN: 250.0000 mL | INTRAVENOUS | Status: DC | PRN

## 2012-08-25 MED ORDER — TRAMADOL HCL 50 MG PO TABS: 50.0000 mg | ORAL_TABLET | ORAL | Status: DC | PRN

## 2012-08-25 MED ORDER — FUROSEMIDE 40 MG PO TABS
40.0000 mg | ORAL_TABLET | Freq: Every day | ORAL | Status: DC
  Administered 2012-08-26 – 2012-08-30 (×5): 40 mg via ORAL
  Filled 2012-08-25 (×5): qty 1

## 2012-08-25 MED ORDER — INSULIN ASPART 100 UNIT/ML ~~LOC~~ SOLN
0.0000 [IU] | Freq: Three times a day (TID) | SUBCUTANEOUS | Status: DC
  Administered 2012-08-25 – 2012-08-30 (×13): 2 [IU] via SUBCUTANEOUS

## 2012-08-25 MED ORDER — VITAMIN B-1 100 MG PO TABS
100.0000 mg | ORAL_TABLET | Freq: Every day | ORAL | Status: DC
  Administered 2012-08-25 – 2012-08-30 (×6): 100 mg via ORAL
  Filled 2012-08-25 (×6): qty 1

## 2012-08-25 MED ORDER — POTASSIUM CHLORIDE CRYS ER 20 MEQ PO TBCR
20.0000 meq | EXTENDED_RELEASE_TABLET | Freq: Every day | ORAL | Status: DC
  Administered 2012-08-26 – 2012-08-30 (×5): 20 meq via ORAL
  Filled 2012-08-25 (×5): qty 1

## 2012-08-25 MED ORDER — LEVALBUTEROL HCL 1.25 MG/0.5ML IN NEBU
1.2500 mg | INHALATION_SOLUTION | Freq: Four times a day (QID) | RESPIRATORY_TRACT | Status: DC | PRN
  Filled 2012-08-25: qty 0.5

## 2012-08-25 NOTE — Progress Notes (Signed)
Patient refuses to have foley cath removed at this time secondary to incontinence, and IV Lasix use.  Patient informed on the risks associated with indwelling foley catheters, and of MD order to DC foley.  Patient states that he feels that he "would be better to use urinal tomorrow." and still requests to have foley left in place today.

## 2012-08-25 NOTE — Progress Notes (Signed)
Anesthesiology Follow-up:  Awake and alert, neuro intact, complaining of chest soreness with coughing, otherwise comfortable. Hemodynamically stable.  VS: T-36.7 BP 107/61 HR 93, SR with LBBB (chronic) RR-22 O2Sat 95% on 3L  K-3.9 BUN/Cr.: 14/1.01 H/H 11.8/34.7 Plts: 80,000  Extubated 4 hours post-op. Doing well overall to stepdown today.  Kipp Brood, MD

## 2012-08-25 NOTE — Progress Notes (Addendum)
[address]       [place] [address]             [phone]        CARDIOTHORACIC SURGERY PROGRESS NOTE   R2 Days Post-Op Procedure(s) (LRB): CORONARY ARTERY BYPASS GRAFTING times four using Right Greater Saphenous Vein Graft harvested endoscopically and Left Radail Artery Graft (N/A) INTRAOPERATIVE TRANSESOPHAGEAL ECHOCARDIOGRAM (N/A) RADIAL ARTERY HARVEST (Left)  Subjective: Feels okay.  Still sore in chest.  Objective: Vital signs: BP Readings from Last 1 Encounters:  08/25/12 116/57   Pulse Readings from Last 1 Encounters:  08/25/12 97   Resp Readings from Last 1 Encounters:  08/25/12 22   Temp Readings from Last 1 Encounters:  08/25/12 98.7 F (37.1 C) Oral    Hemodynamics: PAP: (22-30)/(12-20) 26/14 mmHg CO:  [6.1 L/min-6.5 L/min] 6.5 L/min CI:  [2.8 L/min/m2-3 L/min/m2] 3 L/min/m2  Physical Exam:  Rhythm:   sinus  Breath sounds: clear  Heart sounds:  RRR  Incisions:  Dressing intact  Abdomen:  Soft, non-distended, non-tender  Extremities:  Warm, well-perfused    Intake/Output from previous day: 07/12 0701 - 07/13 0700 In: 1857.9 [P.O.:1080; I.V.:377.9; IV Piggyback:400] Out: 3020 [Urine:2330; Chest Tube:690] Intake/Output this shift: Total I/O In: 90 [I.V.:40; IV Piggyback:50] Out: 150 [Urine:100; Chest Tube:50]  Lab Results:  Recent Labs  08/24/12 1630 08/24/12 1634 08/25/12 0350  WBC 9.8  --  9.7  HGB 12.1* 11.9* 11.8*  HCT 36.1* 35.0* 34.7*  PLT 79*  --  80*   BMET:  Recent Labs  08/24/12 0430  08/24/12 1634 08/25/12 0350  NA 145  --  143 139  K 3.6  --  3.7 3.9  CL 110  --  104 104  CO2 26  --   --  30  GLUCOSE 124*  --  153* 116*  BUN 16  --  16 14  CREATININE 1.15  < > 1.20 1.01  CALCIUM 8.2*  --   --  8.3*  < > = values in this interval not displayed.  CBG (last 3)   Recent Labs  08/24/12 2336 08/25/12 0356 08/25/12 0726  GLUCAP 159* 123* 115*   ABG    Component Value Date/Time   PHART 7.312* 08/23/2012 1951   HCO3 24.1* 08/23/2012 1951   TCO2 26 08/24/2012 1634   ACIDBASEDEF 2.0 08/23/2012 1951   O2SAT 88.0 08/23/2012 1951   CXR: *RADIOLOGY REPORT*  Clinical Data: Postop from CABG. Coronary artery disease.  Diabetes and hypertension.  PORTABLE CHEST - 1 VIEW  Comparison: 08/24/2012  Findings: A Swan-Ganz catheter has been removed. Right jugular  Cordis and center venous catheter, and left chest tube remain in  place. No pneumothorax identified.  Low lung volumes are again seen with left retrocardiac atelectasis  which shows no significant change. Cardiomegaly is also stable.  IMPRESSION:  Stable left retrocardiac atelectasis and cardiomegaly. No  pneumothorax visualized.  Original Report Authenticated By: Myles Rosenthal, M.D.   Assessment/Plan: S/P Procedure(s) (LRB): CORONARY ARTERY BYPASS GRAFTING times four using Right Greater Saphenous Vein Graft harvested endoscopically and Left Radail Artery Graft (N/A) INTRAOPERATIVE TRANSESOPHAGEAL ECHOCARDIOGRAM (N/A) RADIAL ARTERY HARVEST (Left)  Overall stable POD2 Maintaining NSR/sinus tach w/ stable BP Expected post op acute blood loss anemia, mild, stable  Expected post op volume excess  Expected post op atelectasis  Type II diabetes mellitus, excellent glycemic control   Mobilize  Diuresis  D/C lines Change CBG's and SSI to ac/hs, continue levemir  for now  Increase beta blocker Imdur for RA graft  Transfer step down   OWEN,CLARENCE H 08/25/2012 9:57 AM

## 2012-08-25 NOTE — Progress Notes (Signed)
Verbal/phone report given to "Raynelle Fanning, RN" on unit 2000. Patient to be transferred to room 2016 via wheelchair to bed. All belongings to be sent to unit 2000, including meds/chart. Family updated on room change/transfer.  VSS. No acute distress noted. Safety maintained this shift.

## 2012-08-26 ENCOUNTER — Inpatient Hospital Stay (HOSPITAL_COMMUNITY): Payer: BC Managed Care – PPO

## 2012-08-26 LAB — CBC
HCT: 37 % — ABNORMAL LOW (ref 39.0–52.0)
Hemoglobin: 12.1 g/dL — ABNORMAL LOW (ref 13.0–17.0)
MCH: 28.8 pg (ref 26.0–34.0)
MCHC: 32.7 g/dL (ref 30.0–36.0)
MCV: 88.1 fL (ref 78.0–100.0)
RDW: 14.7 % (ref 11.5–15.5)

## 2012-08-26 LAB — GLUCOSE, CAPILLARY
Glucose-Capillary: 131 mg/dL — ABNORMAL HIGH (ref 70–99)
Glucose-Capillary: 110 mg/dL — ABNORMAL HIGH (ref 70–99)
Glucose-Capillary: 123 mg/dL — ABNORMAL HIGH (ref 70–99)
Glucose-Capillary: 130 mg/dL — ABNORMAL HIGH (ref 70–99)
Glucose-Capillary: 104 mg/dL — ABNORMAL HIGH (ref 70–99)

## 2012-08-26 LAB — BASIC METABOLIC PANEL
BUN: 17 mg/dL (ref 6–23)
Creatinine, Ser: 1.05 mg/dL (ref 0.50–1.35)
GFR calc Af Amer: 87 mL/min — ABNORMAL LOW (ref >90)
GFR calc non Af Amer: 75 mL/min — ABNORMAL LOW (ref >90)
Glucose, Bld: 123 mg/dL — ABNORMAL HIGH (ref 70–99)
Potassium: 3.5 mEq/L (ref 3.5–5.1)

## 2012-08-26 MED ORDER — INSULIN DETEMIR 100 UNIT/ML ~~LOC~~ SOLN
18.0000 [IU] | Freq: Two times a day (BID) | SUBCUTANEOUS | Status: DC
  Administered 2012-08-26 – 2012-08-28 (×6): 18 [IU] via SUBCUTANEOUS
  Filled 2012-08-26 (×9): qty 0.18

## 2012-08-26 MED ORDER — GLIPIZIDE ER 2.5 MG PO TB24
2.5000 mg | ORAL_TABLET | Freq: Every day | ORAL | Status: DC
  Administered 2012-08-26 – 2012-08-30 (×5): 2.5 mg via ORAL
  Filled 2012-08-26 (×6): qty 1

## 2012-08-26 MED FILL — Lidocaine HCl IV Inj 20 MG/ML: INTRAVENOUS | Qty: 10 | Status: AC

## 2012-08-26 MED FILL — Heparin Sodium (Porcine) Inj 1000 Unit/ML: INTRAMUSCULAR | Qty: 20 | Status: AC

## 2012-08-26 MED FILL — Sodium Chloride IV Soln 0.9%: INTRAVENOUS | Qty: 1000 | Status: AC

## 2012-08-26 MED FILL — Sodium Chloride Irrigation Soln 0.9%: Qty: 3000 | Status: AC

## 2012-08-26 MED FILL — Mannitol IV Soln 20%: INTRAVENOUS | Qty: 500 | Status: AC

## 2012-08-26 MED FILL — Sodium Bicarbonate IV Soln 8.4%: INTRAVENOUS | Qty: 50 | Status: AC

## 2012-08-26 MED FILL — Magnesium Sulfate Inj 50%: INTRAMUSCULAR | Qty: 10 | Status: AC

## 2012-08-26 MED FILL — Potassium Chloride Inj 2 mEq/ML: INTRAVENOUS | Qty: 40 | Status: AC

## 2012-08-26 MED FILL — Electrolyte-R (PH 7.4) Solution: INTRAVENOUS | Qty: 1000 | Status: AC

## 2012-08-26 MED FILL — Heparin Sodium (Porcine) Inj 1000 Unit/ML: INTRAMUSCULAR | Qty: 30 | Status: AC

## 2012-08-26 NOTE — Progress Notes (Addendum)
       [address]       [place] [address]             [phone]          3 Days Post-Op Procedure(s) (LRB): CORONARY ARTERY BYPASS GRAFTING times four using Right Greater Saphenous Vein Graft harvested endoscopically and Left Radail Artery Graft (N/A) INTRAOPERATIVE TRANSESOPHAGEAL ECHOCARDIOGRAM (N/A) RADIAL ARTERY HARVEST (Left)  Subjective: Sore, but breathing stable, feels well otherwise.  Objective: Vital signs in last 24 hours: Patient Vitals for the past 24 hrs:  BP Temp Temp src Pulse Resp SpO2 Weight  08/26/12 0631 144/81 mmHg 98.7 F (37.1 C) Oral 93 18 95 % 242 lb 8.1 oz (110 kg)  08/25/12 2124 110/59 mmHg 99 F (37.2 C) Oral 102 17 97 % -  08/25/12 1705 118/55 mmHg 98.8 F (37.1 C) Oral 104 16 94 % -  08/25/12 1543 - 98.4 F (36.9 C) Oral - - - -  08/25/12 1500 117/53 mmHg - - - 15 - -  08/25/12 1300 107/61 mmHg - - 93 22 95 % -  08/25/12 1200 113/60 mmHg - - 89 15 96 % -  08/25/12 1157 - 98.1 F (36.7 C) Oral - - - -  08/25/12 1100 - - - 93 16 95 % -  08/25/12 1000 119/55 mmHg - - 96 21 94 % -  08/25/12 0900 - - - 97 22 95 % -  08/25/12 0830 116/57 mmHg - - 102 23 95 % -   Current Weight  08/26/12 242 lb 8.1 oz (110 kg)  PRE-OPERATIVE WEIGHT: 111 kg    Intake/Output from previous day: 07/13 0701 - 07/14 0700 In: 110 [I.V.:60; IV Piggyback:50] Out: 2320 [Urine:2250; Chest Tube:70]  CBGs 143-128-143-120-123-110   PHYSICAL EXAM:  Heart: RRR Lungs: Clear Wound: Clean and dry Extremities: Mild LE edema    Lab Results: CBC: Recent Labs  08/25/12 0350 08/26/12 0510  WBC 9.7 8.1  HGB 11.8* 12.1*  HCT 34.7* 37.0*  PLT 80* 99*   BMET:  Recent Labs  08/25/12 0350 08/26/12 0510  NA 139 141  K 3.9 3.5  CL 104 102  CO2 30 32  GLUCOSE 116* 123*  BUN 14 17  CREATININE 1.01 1.05  CALCIUM 8.3* 8.6    PT/INR:  Recent Labs  08/23/12 1500  LABPROT 16.8*  INR 1.40    CXR: stable  Assessment/Plan: S/P  Procedure(s) (LRB): CORONARY ARTERY BYPASS GRAFTING times four using Right Greater Saphenous Vein Graft harvested endoscopically and Left Radail Artery Graft (N/A) INTRAOPERATIVE TRANSESOPHAGEAL ECHOCARDIOGRAM (N/A) RADIAL ARTERY HARVEST (Left) CV- BPs better controlled, SR.  Continue current meds. Vol overload- diurese. DM- sugars stable.  Resume po meds and decrease Lantus. A1C= 6.1 CRPI, pulm toilet.   LOS: 3 days    COLLINS,GINA H 08/26/2012  Patient seen and examined. Agree with above. C/o having a lot of incisional pain, but is sleeping most of the time per RN

## 2012-08-26 NOTE — Care Management Note (Signed)
    Page 1 of 2   08/29/2012     3:50:00 PM   CARE MANAGEMENT NOTE 08/29/2012  Patient:  Kenneth Giles, Kenneth Giles   Account Number:  [id number]  Date Initiated:  08/26/2012  Documentation initiated by:  Nakiesha Rumsey  Subjective/Objective Assessment:   PT S/P CABG X 4 ON 08/23/12.  PTA, PT INDEPENDENT, LIVES WITH GIRLFRIEND.     Action/Plan:   MET WITH PT TO DISCUSS DC PLANS.  STATES GIRLFRIEND WORKS; NOT SURE SHE CAN PROVIDE 24HR CARE.  MAY BE ABLE TO GET SISTER FROM [place] TO STAY WITH HIM.  WILL FOLLOW UP.   Anticipated DC Date:  08/29/2012   Anticipated DC Plan:  HOME W HOME HEALTH SERVICES      DC Planning Services  CM consult      [facility] Choice  HOME HEALTH   Choice offered to / List presented to:  C-1 Patient   DME arranged  Levan Hurst      DME agency  [facility]     [facility] arranged  HH-1 RN      [facility] agency  [facility]   Status of service:  Completed, signed off Medicare Important Message given?   (If response is "NO", the following Medicare IM given date fields will be blank) Date Medicare IM given:   Date Additional Medicare IM given:    Discharge Disposition:  HOME W HOME HEALTH SERVICES  Per UR Regulation:  Reviewed for med. necessity/level of care/duration of stay  If discussed at Long Length of Stay Meetings, dates discussed:   08/29/2012    Comments:  08/29/12 Braidon Chermak,RN,BSN [phone] PT FOR DC HOME ON 7/18; STATES WILL NEED RW FOR HOME. REFERRAL TO [facility] FOR HH NEEDS, PER PT CHOICE.  START OF CARE 24-48H POST DC DATE.  REFERRAL TO [facility] FOR DME NEEDS.  08/28/12 Ernest Popowski,RN,BSN [phone] PT STATES SISTER TO ARRIVE ON [place] TO PROVIDE 24HR CARE X 1 WEEK POST OP.   HHRN FOR RESTORATIVE CARE ORDERED; PT GIVEN [facility] HH PROVIDER LIST FOR REVIEW.  WILL FOLLOW UP IN AM WITH PT'S DECISION.  PT MAY NEED RW FOR HOME; UNSURE IF HE CAN BORROW ONE.  08/26/12 Bernyce Brimley,RN,BSN [phone] PT STATES HE CAN BORROW A WALKER, IF NEEDED, AT  DC.

## 2012-08-26 NOTE — Progress Notes (Addendum)
CARDIAC REHAB PHASE I   PRE:  Rate/Rhythm: 100 SR BBB  BP:  Supine: 136/72  Sitting:   Standing:    SaO2: 91 RA  MODE:  Ambulation: 350 ft   POST:  Rate/Rhythm: 117 ST BBB  BP:  Supine:   Sitting: 146/75   SaO2: 92 RA [id number] On arrival pt in bed states he did not sleep well due to incisional pain. Assisted X1 and used walker to ambulate. Gait steady with walker, slow pace. Pt kept eyes closed during walk and c/o of incisional pain. VS stable. Pt to recliner after walk with call light in reach. Encouraged two more walks today and use of IS. Reported pain to RN Melina Copa RN 08/26/2012 9:11 AM

## 2012-08-27 ENCOUNTER — Encounter (HOSPITAL_COMMUNITY): Payer: Self-pay | Admitting: Cardiothoracic Surgery

## 2012-08-27 LAB — GLUCOSE, CAPILLARY
Glucose-Capillary: 116 mg/dL — ABNORMAL HIGH (ref 70–99)
Glucose-Capillary: 136 mg/dL — ABNORMAL HIGH (ref 70–99)
Glucose-Capillary: 93 mg/dL (ref 70–99)
Glucose-Capillary: 123 mg/dL — ABNORMAL HIGH (ref 70–99)

## 2012-08-27 MED ORDER — LACTULOSE 10 GM/15ML PO SOLN
20.0000 g | Freq: Once | ORAL | Status: AC
  Administered 2012-08-27: 20 g via ORAL
  Filled 2012-08-27: qty 30

## 2012-08-27 MED ORDER — METOPROLOL TARTRATE 50 MG PO TABS
50.0000 mg | ORAL_TABLET | Freq: Two times a day (BID) | ORAL | Status: DC
  Administered 2012-08-27 – 2012-08-30 (×7): 50 mg via ORAL
  Filled 2012-08-27 (×9): qty 1

## 2012-08-27 NOTE — Progress Notes (Signed)
      [address]       [place] [address]             [phone]      4 Days Post-Op Procedure(s) (LRB): CORONARY ARTERY BYPASS GRAFTING times four using Right Greater Saphenous Vein Graft harvested endoscopically and Left Radail Artery Graft (N/A) INTRAOPERATIVE TRANSESOPHAGEAL ECHOCARDIOGRAM (N/A) RADIAL ARTERY HARVEST (Left)  Subjective:  Kenneth Giles is in and out of sleep this morning.  No complaints, No BM  Objective: Vital signs in last 24 hours: Temp:  [97.2 F (36.2 C)-98.7 F (37.1 C)] 98.7 F (37.1 C) (07/15 0608) Pulse Rate:  [97-109] 109 (07/15 0608) Cardiac Rhythm:  [-] Bundle branch block (07/15 0745) Resp:  [18] 18 (07/15 0608) BP: (139-165)/(71-81) 165/81 mmHg (07/15 0608) SpO2:  [96 %-97 %] 96 % (07/15 0608) Weight:  [235 lb 14.3 oz (107 kg)] 235 lb 14.3 oz (107 kg) (07/15 0500)  Intake/Output from previous day: 07/14 0701 - 07/15 0700 In: 363 [P.O.:360; I.V.:3] Out: 1200 [Urine:1200]  General appearance: alert, cooperative and no distress Heart: regular rate and rhythm and tachy Lungs: clear to auscultation bilaterally Abdomen: soft, non-tender; bowel sounds normal; no masses,  no organomegaly Extremities: trace Wound: clean and dry  Lab Results:  Recent Labs  08/25/12 0350 08/26/12 0510  WBC 9.7 8.1  HGB 11.8* 12.1*  HCT 34.7* 37.0*  PLT 80* 99*   BMET:  Recent Labs  08/25/12 0350 08/26/12 0510  NA 139 141  K 3.9 3.5  CL 104 102  CO2 30 32  GLUCOSE 116* 123*  BUN 14 17  CREATININE 1.01 1.05  CALCIUM 8.3* 8.6    PT/INR: No results found for this basename: LABPROT, INR,  in the last 72 hours ABG    Component Value Date/Time   PHART 7.312* 08/23/2012 1951   HCO3 24.1* 08/23/2012 1951   TCO2 26 08/24/2012 1634   ACIDBASEDEF 2.0 08/23/2012 1951   O2SAT 88.0 08/23/2012 1951   CBG (last 3)   Recent Labs  08/26/12 1617 08/26/12 2056 08/27/12 0613  GLUCAP 130* 104* 116*    Assessment/Plan: S/P  Procedure(s) (LRB): CORONARY ARTERY BYPASS GRAFTING times four using Right Greater Saphenous Vein Graft harvested endoscopically and Left Radail Artery Graft (N/A) INTRAOPERATIVE TRANSESOPHAGEAL ECHOCARDIOGRAM (N/A) RADIAL ARTERY HARVEST (Left)  1. CV- Sinus Tach rate in the 100s, hypertensive with SBP in the 160s- will increase Lopressor to 50 mg BID, if no improvement can restart Lisinopril 2. Pulm- no acutes issues, continue IS 3. Renal- weight is stable, below admission, patient on Lasix 4. DM- CBGs controlled, continue current regimen 5. Deconditioning- patient needs to ambulate 6. Dispo- will d/c EPW, work on HR and BP control   LOS: 4 days    Kenneth Giles 08/27/2012

## 2012-08-27 NOTE — Progress Notes (Signed)
CARDIAC REHAB PHASE I   PRE:  Rate/Rhythm: 100 SR BBB  BP:  Supine: 146/86  Sitting:   Standing:    SaO2: 93 RA  MODE:  Ambulation: 550 ft   POST:  Rate/Rhythm: 120  BP:  Supine:   Sitting: 160/80  Standing:    SaO2: 93 RA 1610-9604 Assisted X 1 and used walker to ambulate. Gait steady with walker, continues slow pace. Pt seems more awake today and talkative. Pt walked 550 feet without c/o. Pt to recliner after walk with call light in reach. Pt very diaphoretic on arrival, he states that this is usual for him.  Melina Copa RN 08/27/2012 11:06 AM

## 2012-08-27 NOTE — Progress Notes (Signed)
EPW d/c per MD order at 1230. All wires intact upon removal. Vital signs stable. Pt tolerated well. Bed rest for one hour. Call bell in reach. Pain medicine given. Dion Saucier

## 2012-08-27 NOTE — Progress Notes (Signed)
Pt ambulated 150 ft around circle. Encouraged to ambulate further but c/o of chest incision hurting. Pain medicine given and pt positioned in chair. Kenneth Giles

## 2012-08-28 ENCOUNTER — Encounter (HOSPITAL_COMMUNITY): Payer: Self-pay | Admitting: Cardiology

## 2012-08-28 LAB — GLUCOSE, CAPILLARY
Glucose-Capillary: 123 mg/dL — ABNORMAL HIGH (ref 70–99)
Glucose-Capillary: 128 mg/dL — ABNORMAL HIGH (ref 70–99)
Glucose-Capillary: 128 mg/dL — ABNORMAL HIGH (ref 70–99)
Glucose-Capillary: 127 mg/dL — ABNORMAL HIGH (ref 70–99)

## 2012-08-28 MED ORDER — LISINOPRIL 10 MG PO TABS
10.0000 mg | ORAL_TABLET | Freq: Every day | ORAL | Status: DC
  Administered 2012-08-28: 10 mg via ORAL
  Filled 2012-08-28 (×2): qty 1

## 2012-08-28 NOTE — Progress Notes (Signed)
CARDIAC REHAB PHASE I   PRE:  Rate/Rhythm: 86 SR BBB  BP:  Supine:   Sitting: 130/70  Standing:    SaO2: 94%RA  MODE:  Ambulation: 600 ft   POST:  Rate/Rhythm: 115   BP:  Supine:   Sitting: 146/82  Standing:    SaO2: 97%RA 1430-1505 Pt walked 600 ft on RA with rolling walker and asst x 1 with steady gait. Tolerated well. Talked whole walk. To recliner after walk.    Luetta Nutting, RN BSN  08/28/2012 3:03 PM

## 2012-08-28 NOTE — Progress Notes (Addendum)
      [address]       [place] [address]             [phone]      5 Days Post-Op Procedure(s) (LRB): CORONARY ARTERY BYPASS GRAFTING times four using Right Greater Saphenous Vein Graft harvested endoscopically and Left Radail Artery Graft (N/A) INTRAOPERATIVE TRANSESOPHAGEAL ECHOCARDIOGRAM (N/A) RADIAL ARTERY HARVEST (Left)  Subjective:  Kenneth Giles is feeling better this morning.  He is ambulating without difficulty.  He has no help at home until Friday at which time his sister will be coming to take care of him over the next few weeks. +BM  Objective: Vital signs in last 24 hours: Temp:  [97.7 F (36.5 C)-98.7 F (37.1 C)] 97.7 F (36.5 C) (07/16 0618) Pulse Rate:  [90-109] 93 (07/16 0618) Cardiac Rhythm:  [-] Bundle branch block;Sinus tachycardia (07/16 0809) Resp:  [17-18] 18 (07/16 0618) BP: (140-156)/(68-89) 156/89 mmHg (07/16 0618) SpO2:  [93 %-95 %] 95 % (07/16 0824) Weight:  [238 lb 5.1 oz (108.1 kg)] 238 lb 5.1 oz (108.1 kg) (07/16 0618)  Intake/Output from previous day: 07/15 0701 - 07/16 0700 In: 840 [P.O.:840] Out: 575 [Urine:575]  General appearance: alert, cooperative and no distress Heart: regular rate and rhythm Lungs: clear to auscultation bilaterally Abdomen: soft, non-tender; bowel sounds normal; no masses,  no organomegaly Extremities: edema trace Wound: clean and dry  Lab Results:  Recent Labs  08/26/12 0510  WBC 8.1  HGB 12.1*  HCT 37.0*  PLT 99*   BMET:  Recent Labs  08/26/12 0510  NA 141  K 3.5  CL 102  CO2 32  GLUCOSE 123*  BUN 17  CREATININE 1.05  CALCIUM 8.6    PT/INR: No results found for this basename: LABPROT, INR,  in the last 72 hours ABG    Component Value Date/Time   PHART 7.312* 08/23/2012 1951   HCO3 24.1* 08/23/2012 1951   TCO2 26 08/24/2012 1634   ACIDBASEDEF 2.0 08/23/2012 1951   O2SAT 88.0 08/23/2012 1951   CBG (last 3)   Recent Labs  08/27/12 1615 08/27/12 2129 08/28/12 0616    GLUCAP 93 123* 123*    Assessment/Plan: S/P Procedure(s) (LRB): CORONARY ARTERY BYPASS GRAFTING times four using Right Greater Saphenous Vein Graft harvested endoscopically and Left Radail Artery Graft (N/A) INTRAOPERATIVE TRANSESOPHAGEAL ECHOCARDIOGRAM (N/A) RADIAL ARTERY HARVEST (Left)  1. CV- NSR, remains hypertensive- will continue Lopressor and restart home Lisinopril at 10 mg 2. Pulm-no acute issues, continue IS 3. Renal-weight is stable, creatinine was back to WNL 4.  DM- CBGs controlled, continue current regimen 5. Dispo- patient is stable, added Lisinopril for HTN, patient lives alone and wont have help to Friday, will plan for d/c then   LOS: 5 days    Kenneth Giles, Kenneth Giles 08/28/2012  I have seen and examined the patient and agree with the assessment and plan as outlined.  Kenneth Giles,Kenneth Giles 08/28/2012 9:33 AM

## 2012-08-29 LAB — GLUCOSE, CAPILLARY
Glucose-Capillary: 127 mg/dL — ABNORMAL HIGH (ref 70–99)
Glucose-Capillary: 111 mg/dL — ABNORMAL HIGH (ref 70–99)
Glucose-Capillary: 112 mg/dL — ABNORMAL HIGH (ref 70–99)
Glucose-Capillary: 123 mg/dL — ABNORMAL HIGH (ref 70–99)

## 2012-08-29 MED ORDER — LISINOPRIL 20 MG PO TABS
20.0000 mg | ORAL_TABLET | Freq: Every day | ORAL | Status: DC
  Administered 2012-08-29 – 2012-08-30 (×2): 20 mg via ORAL
  Filled 2012-08-29 (×2): qty 1

## 2012-08-29 NOTE — Progress Notes (Addendum)
      [address]       [place] [address]             [phone]      6 Days Post-Op Procedure(s) (LRB): CORONARY ARTERY BYPASS GRAFTING times four using Right Greater Saphenous Vein Graft harvested endoscopically and Left Radail Artery Graft (N/A) INTRAOPERATIVE TRANSESOPHAGEAL ECHOCARDIOGRAM (N/A) RADIAL ARTERY HARVEST (Left)  Subjective:  Mr. Pesta complains of pain this morning.  He is also experiencing come worsening hoarseness.  + Ambulation +BM  Objective: Vital signs in last 24 hours: Temp:  [97.9 F (36.6 C)-98.9 F (37.2 C)] 97.9 F (36.6 C) (07/17 0443) Pulse Rate:  [94-106] 106 (07/17 0443) Cardiac Rhythm:  [-] Bundle branch block;Sinus tachycardia (07/16 2000) Resp:  [18] 18 (07/17 0443) BP: (108-164)/(64-85) 164/85 mmHg (07/17 0443) SpO2:  [95 %-98 %] 95 % (07/17 0443) Weight:  [235 lb 14.3 oz (107 kg)] 235 lb 14.3 oz (107 kg) (07/17 0401)  Intake/Output from previous day: 07/16 0701 - 07/17 0700 In: 1320 [P.O.:1320] Out: -   General appearance: alert, cooperative and no distress Heart: regular rate and rhythm Lungs: clear to auscultation bilaterally Abdomen: soft, non-tender; bowel sounds normal; no masses,  no organomegaly Extremities: edema trace Wound: clean and dry  Lab Results: No results found for this basename: WBC, HGB, HCT, PLT,  in the last 72 hours BMET: No results found for this basename: NA, K, CL, CO2, GLUCOSE, BUN, CREATININE, CALCIUM,  in the last 72 hours  PT/INR: No results found for this basename: LABPROT, INR,  in the last 72 hours ABG    Component Value Date/Time   PHART 7.312* 08/23/2012 1951   HCO3 24.1* 08/23/2012 1951   TCO2 26 08/24/2012 1634   ACIDBASEDEF 2.0 08/23/2012 1951   O2SAT 88.0 08/23/2012 1951   CBG (last 3)   Recent Labs  08/28/12 1618 08/28/12 2142 08/29/12 0618  GLUCAP 128* 127* 127*    Assessment/Plan: S/P Procedure(s) (LRB): CORONARY ARTERY BYPASS GRAFTING times four using  Right Greater Saphenous Vein Graft harvested endoscopically and Left Radail Artery Graft (N/A) INTRAOPERATIVE TRANSESOPHAGEAL ECHOCARDIOGRAM (N/A) RADIAL ARTERY HARVEST (Left)  1. CV- NSR, remains hypertensive- on Lopressor 50 mg BID, Increased LIsinopril to home dose of 20 mg daily- if this does not improve can increase Lopressor further 2. Pulm- no acute issues, encouraged use of IS 3. ENT- hoarseness when talking- order Cepacol lozenges, and follow 4. Renal- weight stable, continue Lasix 5. DM- CBGs controlled 6. Dispo- patient stable, good pain control with Oxy, adjusted medications for hypertension, likely home in AM   LOS: 6 days    BARRETT, [name] 08/29/2012  Patient seen and examined. Agree with above

## 2012-08-29 NOTE — Progress Notes (Signed)
Pt encouraged to get up OOB and ambulate this AM; pt with steady gait, just needing walker to ambulate; pt OOB to chair at this time; not wanting to ambulate in hallways; will cont. To monitor.

## 2012-08-29 NOTE — Discharge Summary (Signed)
[address]       [place] [address]             [phone]              Discharge Summary  Name: Kenneth Giles DOB: 04/04/1950 62 y.o. MRN: [id number]   Admission Date: 08/23/2012 Discharge Date:     Admitting Diagnosis: Coronary artery disease   Discharge Diagnosis:  Coronary artery disease Expected postoperative blood loss anemia  Past Medical History  Diagnosis Date  . Hypercholesteremia     takes Lipitor daily  . Allergic rhinitis     takes Zyrtec daily  . Chronic low back pain     cause unknown  . Left bundle branch block   . Thrombocytopenia   . Nephrolithiasis   . H/O renal calculi     no treatment needed  . Lipoma of back   . CAD (coronary artery disease)   . Diabetes mellitus     takes Glipizide daily  . Constipation     Citrucel daily  . GERD (gastroesophageal reflux disease)     takes Omeprazole daily  . Hypertension     takes Metoprolol daily and Lisinopril   . Pneumonia     hx of about 10+yrs ago  . Headache(784.0)     rarely   . Peripheral neuropathy   . Peripheral edema     takes Lozol daily  . Arthritis     hands   . History of colon polyps   . Diverticulosis   . Urinary frequency   . Enlarged prostate     slightly  . Nocturia   . History of blood transfusion     no abnormal reaction noted     Procedures: CORONARY ARTERY BYPASS GRAFTING x 4 -08/23/2012  (Left internal mammary artery to left anterior descending, left radial artery to right coronary artery, saphenous vein graft to obtuse marginal 1, saphenous vein graft to diagonal)  ENDOSCOPIC VEIN HARVEST RIGHT THIGH  LEFT RADIAL ARTERY HARVEST     HPI:  The patient is a 62 y.o. male who initially presented with a 3-6 month history of exertional chest tightness and dyspnea.  He underwent a myocardial perfusion study which revealed an intermediate to high risk study with anterior and inferior infarct/ischemia and decreased EF of 30-35%.  He  was seen by Dr. Verdis Prime, and cardiac catheterization was recommended.  This was performed on 08/19/2012, and showed severe 3 vessel coronary artery disease, including 90% proximal RCA stenosis, 80% to 90% proximal LAD stenosis, 80% stenosis of the ramus intermediate, and EF 30%.  A cardiac surgery consult was requested and the patient was seen by Dr. Donata Clay.  He felt the patient would benefit from surgical revascularization. All risks, benefits and alternatives of surgery were explained in detail, and the patient agreed to proceed.  He was discharged home and surgery was scheduled for 08/23/2012.    Hospital Course:  The patient was admitted to [facility] on 08/23/2012. The patient was taken to the operating room and underwent the above procedure.    The postoperative course has been notable for hypertension and tachycardia. He has been started on a beta blocker, ACE-I, as well as aspirin and statin, and his doses have been titrated accordingly.  He was also started on Imdur for his radial artery graft. He has been restarted on his home diabetes medications and blood sugars have been controlled.  He has had a  mild volume overload, and was started on Lasix, to which he is responding well. He is ambulating with cardiac rehab and progressing well with mobility.  He is tolerating a regular diet. He is medically stable for discharge on today's date.     Recent vital signs:  Filed Vitals:   08/29/12 0443  BP: 164/85  Pulse: 106  Temp: 97.9 F (36.6 C)  Resp: 18    Recent laboratory studies:  CBC:No results found for this basename: WBC, HGB, HCT, PLT,  in the last 72 hours BMET: No results found for this basename: NA, K, CL, CO2, GLUCOSE, BUN, CREATININE, CALCIUM,  in the last 72 hours  PT/INR: No results found for this basename: LABPROT, INR,  in the last 72 hours   Discharge Medications:      Medication List    STOP taking these medications       metoprolol succinate 25 MG 24 hr  tablet  Commonly known as:  TOPROL XL      TAKE these medications       aspirin 325 MG EC tablet  Take 1 tablet (325 mg total) by mouth daily.     atorvastatin 40 MG tablet  Commonly known as:  LIPITOR  Take 40 mg by mouth daily.     budesonide-formoterol 160-4.5 MCG/ACT inhaler  Commonly known as:  SYMBICORT  Inhale 2 puffs into the lungs 2 (two) times daily.     cetirizine 10 MG tablet  Commonly known as:  ZYRTEC  Take 10 mg by mouth daily.     CITRUCEL PO  Take 500 mg by mouth daily.     ESTER-C PO  Take 500 mg by mouth daily.     fenofibrate micronized 200 MG capsule  Commonly known as:  LOFIBRA  Take 200 mg by mouth daily before breakfast.     furosemide 40 MG tablet  Commonly known as:  LASIX  Take 1 tablet (40 mg total) by mouth daily. For 5 Days     glipiZIDE 2.5 MG 24 hr tablet  Commonly known as:  GLUCOTROL XL  Take 2.5 mg by mouth daily.     GLUCOSAMINE-CHONDROITIN PLUS Tabs  Take 1 tablet by mouth daily.     LUTEIN 20 Caps  Take 20 mg by mouth every evening.     indapamide 1.25 MG tablet  Commonly known as:  LOZOL  Take 1.25 mg by mouth every morning.     lisinopril 20 MG tablet  Commonly known as:  PRINIVIL,ZESTRIL  Take 1 tablet (20 mg total) by mouth daily.     metoprolol 100 MG tablet  Commonly known as:  LOPRESSOR  Take 1 tablet (100 mg total) by mouth 2 (two) times daily.     multivitamin tablet  Take 1 tablet by mouth daily.     OMEGA 3 1000 MG Caps  Take 1 capsule by mouth daily.     omeprazole 20 MG capsule  Commonly known as:  PRILOSEC  Take 20 mg by mouth daily.     oxyCODONE 5 MG immediate release tablet  Commonly known as:  Oxy IR/ROXICODONE  Take 1-2 tablets (5-10 mg total) by mouth every 3 (three) hours as needed.     potassium chloride SA 20 MEQ tablet  Commonly known as:  K-DUR,KLOR-CON  Take 1 tablet (20 mEq total) by mouth daily. Take 20 meq in the morning, and 40 meq in the evening.     thiamine 100 MG tablet    Commonly  known as:  VITAMIN B-1  Take 100 mg by mouth daily.     traMADol 50 MG tablet  Commonly known as:  ULTRAM  Take 1 tablet (50 mg total) by mouth every 6 (six) hours as needed.     Zinc 25 MG Tabs  Take 25 mg by mouth every evening.       The patient has been discharged on:   1.Beta Blocker:  Yes [ x  ]                              No   [   ]                              If No, reason:  2.Ace Inhibitor/ARB: Yes [ x  ]                                     No  [    ]                                     If No, reason:  3.Statin:   Yes [  x ]                  No  [   ]                  If No, reason:  4.Ecasa:  Yes  [ x  ]                  No   [   ]                  If No, reason:       Discharge Instructions:  The patient is to refrain from driving, heavy lifting or strenuous activity.  May shower daily and clean incisions with soap and water.  May resume regular diet.   Follow Up:    Follow-up Information   Follow up with VAN Dinah Beers, MD In 3 weeks. (Office will call with an appointment)    Contact information:   [address] [employer] [address] [place] [address] [phone]       Follow up with [facility] NUR[facility] In 1 week. (Office will call with an appointment for staple removal)       Follow up with Lesleigh Noe, MD. Schedule an appointment as soon as possible for a visit in 2 weeks.   Contact information:   [address] [place] [address] [phone]          COLLINS,GINA H 08/29/2012, 8:31 AM

## 2012-08-29 NOTE — Progress Notes (Signed)
Pt encouraged to ambulate at this time; pt not wanting to ambulate; will cont. To encourage.

## 2012-08-29 NOTE — Progress Notes (Signed)
Pt up ambulating independently at this time with walker; will cont. To monitor.

## 2012-08-29 NOTE — Progress Notes (Signed)
CARDIAC REHAB PHASE I   PRE:  Rate/Rhythm: 90 SR BBB  BP:  Sitting: 130/70      SaO2: 93 RA  MODE:  Ambulation: 650 ft   POST:  Rate/Rhythm: 98   BP:  Sitting: 138/80    SaO2: 96 RA  1355-1426 Pt was in recliner upon arrival.  Asked pt if he felt comfortable trying to walk without walker today and he insisted on using it and would like to have a walker to go home with if possible.  He stated he felt safer with the walker.  Pt walked 650 ft assist x1.  Pt tolerated walk well, talked entire time of ambulation with no SOB.  Returned pt to recliner.  Pt stated he did not use his IS often, so reinforced with him the importance and the frequency.  Marvene Staff MS, ACSM RCEP 2:26 PM 08/29/2012

## 2012-08-30 LAB — GLUCOSE, CAPILLARY
Glucose-Capillary: 134 mg/dL — ABNORMAL HIGH (ref 70–99)
Glucose-Capillary: 119 mg/dL — ABNORMAL HIGH (ref 70–99)
Glucose-Capillary: 114 mg/dL — ABNORMAL HIGH (ref 70–99)

## 2012-08-30 MED ORDER — BUDESONIDE-FORMOTEROL FUMARATE 160-4.5 MCG/ACT IN AERO: 2.0000 | INHALATION_SPRAY | Freq: Two times a day (BID) | RESPIRATORY_TRACT | Status: DC

## 2012-08-30 MED ORDER — ASPIRIN 325 MG PO TBEC: 325.0000 mg | DELAYED_RELEASE_TABLET | Freq: Every day | ORAL | Status: DC

## 2012-08-30 MED ORDER — METOPROLOL TARTRATE 100 MG PO TABS: 100.0000 mg | ORAL_TABLET | Freq: Two times a day (BID) | ORAL | Status: DC

## 2012-08-30 MED ORDER — OXYCODONE HCL 5 MG PO TABS: 5.0000 mg | ORAL_TABLET | ORAL | Status: DC | PRN

## 2012-08-30 MED ORDER — FUROSEMIDE 40 MG PO TABS: 40.0000 mg | ORAL_TABLET | Freq: Every day | ORAL | Status: DC

## 2012-08-30 MED ORDER — TRAMADOL HCL 50 MG PO TABS: 50.0000 mg | ORAL_TABLET | Freq: Four times a day (QID) | ORAL | Status: DC | PRN

## 2012-08-30 NOTE — Progress Notes (Addendum)
      [address]       [place] [address]             [phone]      7 Days Post-Op Procedure(s) (LRB): CORONARY ARTERY BYPASS GRAFTING times four using Right Greater Saphenous Vein Graft harvested endoscopically and Left Radail Artery Graft (N/A) INTRAOPERATIVE TRANSESOPHAGEAL ECHOCARDIOGRAM (N/A) RADIAL ARTERY HARVEST (Left)  Subjective:  [name] has no complaints this morning.  He states he feels ready to be discharged.  He is ambulating, +BM  Objective: Vital signs in last 24 hours: Temp:  [99.4 F (37.4 C)-99.6 F (37.6 C)] 99.4 F (37.4 C) (07/18 0608) Pulse Rate:  [103-112] 103 (07/18 9147) Cardiac Rhythm:  [-] Bundle branch block;Sinus tachycardia (07/17 2100) Resp:  [20] 20 (07/18 0608) BP: (150-156)/(69-79) 150/79 mmHg (07/18 0608) SpO2:  [94 %-95 %] 95 % (07/18 0608) Weight:  [231 lb 11.3 oz (105.1 kg)] 231 lb 11.3 oz (105.1 kg) (07/18 8295)  Intake/Output from previous day: 07/17 0701 - 07/18 0700 In: 600 [P.O.:600] Out: 1 [Stool:1]  General appearance: alert, cooperative and no distress Neurologic: intact Heart: regular rate and rhythm Lungs: clear to auscultation bilaterally Abdomen: soft, non-tender; bowel sounds normal; no masses,  no organomegaly Extremities: edema trace Wound: clean and dry  Lab Results: No results found for this basename: WBC, HGB, HCT, PLT,  in the last 72 hours BMET: No results found for this basename: NA, K, CL, CO2, GLUCOSE, BUN, CREATININE, CALCIUM,  in the last 72 hours  PT/INR: No results found for this basename: LABPROT, INR,  in the last 72 hours ABG    Component Value Date/Time   PHART 7.312* 08/23/2012 1951   HCO3 24.1* 08/23/2012 1951   TCO2 26 08/24/2012 1634   ACIDBASEDEF 2.0 08/23/2012 1951   O2SAT 88.0 08/23/2012 1951   CBG (last 3)   Recent Labs  08/29/12 1625 08/29/12 2120 08/30/12 0643  GLUCAP 112* 123* 134*    Assessment/Plan: S/P Procedure(s) (LRB): CORONARY ARTERY BYPASS  GRAFTING times four using Right Greater Saphenous Vein Graft harvested endoscopically and Left Radail Artery Graft (N/A) INTRAOPERATIVE TRANSESOPHAGEAL ECHOCARDIOGRAM (N/A) RADIAL ARTERY HARVEST (Left)  1. CV- NSR, tachy, hypertensive- will increase Lopressor to 100 mg BID, and Continue Lisionpril at 20 mg daily 2. Pulm-no acute issues, encouraged continued use of IS 3. ENT- hoarseness improved 4. Renal- weight is stable- will give short course of lasix 5. DM- CBGs controlled, on home regimen  6. Dispo- patient is doing well, will plan for d/c home today   LOS: 7 days    Kenneth Giles 08/30/2012  Patient seen and examined. Agree with above Home today Instructed not to drive until he sees Dr. Donata Clay in the office

## 2012-08-30 NOTE — Progress Notes (Signed)
[id number] Cardiac Rehab Completed discharge education with pt. He voices understanding. Pt agrees to Outpt. CRP in [place], will send referral. Melina Copa RN

## 2012-08-30 NOTE — Progress Notes (Addendum)
Patient given discharge instructions.  Pt denies any questions or concerns.  Rx and discharge instructions provided to patient.  Patient to be discharge home via private vehicle once significant other arrives.  IV access removed and cannula intact no redness, bleeding, drainage noted.  Telemetry removed and placed in designated area. Pt discharged per wheelchair to private vehicle with personal home walker to home.

## 2012-09-02 NOTE — Discharge Summary (Signed)
patient examined and medical record reviewed,agree with above note. VAN TRIGT III,Donzel Romack 09/02/2012   

## 2012-09-06 ENCOUNTER — Other Ambulatory Visit: Payer: Self-pay | Admitting: *Deleted

## 2012-09-06 DIAGNOSIS — I2510 Atherosclerotic heart disease of native coronary artery without angina pectoris: Secondary | ICD-10-CM

## 2012-09-09 ENCOUNTER — Ambulatory Visit (INDEPENDENT_AMBULATORY_CARE_PROVIDER_SITE_OTHER): Payer: BC Managed Care – PPO

## 2012-09-09 DIAGNOSIS — Z4802 Encounter for removal of sutures: Secondary | ICD-10-CM

## 2012-09-09 DIAGNOSIS — I2510 Atherosclerotic heart disease of native coronary artery without angina pectoris: Secondary | ICD-10-CM

## 2012-09-09 DIAGNOSIS — G8918 Other acute postprocedural pain: Secondary | ICD-10-CM

## 2012-09-09 MED ORDER — OXYCODONE HCL 5 MG PO TABS: 5.0000 mg | ORAL_TABLET | ORAL | Status: DC | PRN

## 2012-09-09 NOTE — Progress Notes (Signed)
  Removed 33 staples from left forearm, the evh site. No signs of infection and pt tolerated well. He did request a RX refill for the pain medication, oxycodone 5 mg. Dr Cornelius Moras signed the RX.

## 2012-09-11 DIAGNOSIS — I2510 Atherosclerotic heart disease of native coronary artery without angina pectoris: Secondary | ICD-10-CM

## 2012-09-13 ENCOUNTER — Other Ambulatory Visit: Payer: Self-pay | Admitting: *Deleted

## 2012-09-16 ENCOUNTER — Ambulatory Visit
Admission: RE | Admit: 2012-09-16 | Discharge: 2012-09-16 | Disposition: A | Discharge status: Home / Self Care | Payer: BC Managed Care – PPO | Source: Ambulatory Visit | Attending: Cardiothoracic Surgery | Admitting: Cardiothoracic Surgery

## 2012-09-16 ENCOUNTER — Other Ambulatory Visit: Payer: Self-pay | Admitting: *Deleted

## 2012-09-16 ENCOUNTER — Ambulatory Visit (INDEPENDENT_AMBULATORY_CARE_PROVIDER_SITE_OTHER): Payer: Self-pay | Admitting: Physician Assistant

## 2012-09-16 VITALS — BP 134/75 | HR 77 | Resp 20 | Ht 67.0 in | Wt 231.0 lb

## 2012-09-16 DIAGNOSIS — Z951 Presence of aortocoronary bypass graft: Secondary | ICD-10-CM

## 2012-09-16 DIAGNOSIS — I2510 Atherosclerotic heart disease of native coronary artery without angina pectoris: Secondary | ICD-10-CM

## 2012-09-16 DIAGNOSIS — J90 Pleural effusion, not elsewhere classified: Secondary | ICD-10-CM

## 2012-09-16 MED ORDER — FUROSEMIDE 20 MG PO TABS: 20.0000 mg | ORAL_TABLET | Freq: Every day | ORAL | Status: DC

## 2012-09-16 NOTE — Progress Notes (Signed)
HPI: Patient returns for routine postoperative follow-up having undergone CABG x 4 on 08/23/2012. The patient's early postoperative recovery while in the hospital was notable for hypertension and tachycardia.  Since hospital discharge the patient reports that he is feeling better.  He does experience some highs and lows.  He states that he gets tired with ambulation.  He also states that he continues to have some incisional discomfort.  He is also experiencing some burning discomfort along his left wrist from his radial harvest site.  He has been slowly decreasing his use of Narcotic pain medication stating he usually takes 1 in the morning and 2 before bed.  He has not tried Tylenol or Ibuprofen for pain control and he was encouraged to do so.  He is accompanied by his sister who states the patient has not been using his Incentive Spirometer much.  His appetite is slowly improving.  He is scheduled to follow up with Cardiologist on Wednesday.   Current Outpatient Prescriptions  Medication Sig Dispense Refill  . aspirin EC 325 MG EC tablet Take 1 tablet (325 mg total) by mouth daily.  30 tablet  0  . atorvastatin (LIPITOR) 40 MG tablet Take 40 mg by mouth daily.      . budesonide-formoterol (SYMBICORT) 160-4.5 MCG/ACT inhaler Inhale 2 puffs into the lungs 2 (two) times daily.  1 Inhaler  1  . cetirizine (ZYRTEC) 10 MG tablet Take 10 mg by mouth daily.      . fenofibrate micronized (LOFIBRA) 200 MG capsule Take 200 mg by mouth daily before breakfast.      . glipiZIDE (GLUCOTROL XL) 2.5 MG 24 hr tablet Take 2.5 mg by mouth daily.      . indapamide (LOZOL) 1.25 MG tablet Take 1.25 mg by mouth every morning.      [name] lisinopril (PRINIVIL,ZESTRIL) 20 MG tablet Take 1 tablet (20 mg total) by mouth daily.  30 tablet  1  . Methylcellulose, Laxative, (CITRUCEL PO) Take 500 mg by mouth daily.      . metoprolol (LOPRESSOR) 100 MG tablet Take 1 tablet (100 mg total) by mouth 2 (two) times daily.  60 tablet  3  .  Misc Natural Products (GLUCOSAMINE-CHONDROITIN PLUS) TABS Take 1 tablet by mouth daily.      . Misc Natural Products (LUTEIN 20) CAPS Take 6 mg by mouth every evening.       . Multiple Vitamin (MULTIVITAMIN) tablet Take 1 tablet by mouth daily.      . OMEGA 3 1000 MG CAPS Take 1 capsule by mouth daily.      [name] omeprazole (PRILOSEC) 20 MG capsule Take 20 mg by mouth daily.      [name] oxyCODONE (OXY IR/ROXICODONE) 5 MG immediate release tablet Take 1-2 tablets (5-10 mg total) by mouth every 3 (three) hours as needed.  40 tablet  0  . potassium chloride SA (K-DUR,KLOR-CON) 20 MEQ tablet Take 20 mEq by mouth daily.      [name] thiamine (VITAMIN B-1) 100 MG tablet Take 100 mg by mouth daily.      . traMADol (ULTRAM) 50 MG tablet Take 1 tablet (50 mg total) by mouth every 6 (six) hours as needed.  30 tablet  0  . Vitamin Mixture (ESTER-C PO) Take 500 mg by mouth daily.      . Zinc 25 MG TABS Take 25 mg by mouth every evening.      . furosemide (LASIX) 20 MG tablet Take 1 tablet (20 mg total) by  mouth daily. For 5 Days  5 tablet  0   No current facility-administered medications for this visit.    Physical Exam:  BP 134/75  Pulse 77  Resp 20  Ht 5\' 7"  (1.702 m)  Wt 231 lb (104.781 kg)  BMI 36.17 kg/m2  SpO2 97%  Gen: no apparent distress, looks great Heart: RRR Lungs: Diminished breath sounds left base Abd: soft non-tender non-distended Skin: incisions are healing well, there is one chest tube site that has not fully closed, no signs of infection present Ext: some edema present bilaterally  Diagnostic Tests:  CXR: Residual pleural fluid and atelectasis on Left  A/P:  1. S/P CABG- overall I feel patient is progressing nicely, would benefit from Cardiac rehab 2. Left Pleural effusion/Atelectasis- poor use of IS, patient encouraged to use his IS more regularly over the next several weeks.  He was also prescribed Lasix 20 mg daily for 5 days.  We will repeat CXR in 1 month at follow up visit with  Dr. Donata Clay 3. Pain control- patient encouraged to use Ibuprofen and Tylenol instead of Oxy IR- patient was agreeable to this 4. Dispo- patient stable, RTC in 4 weeks with CXR with Dr. Donata Clay

## 2012-09-25 ENCOUNTER — Other Ambulatory Visit: Payer: Self-pay | Admitting: Physician Assistant

## 2012-09-26 ENCOUNTER — Encounter (HOSPITAL_COMMUNITY)
Admission: RE | Admit: 2012-09-26 | Discharge: 2012-09-26 | Disposition: A | Discharge status: Home / Self Care | Payer: BC Managed Care – PPO | Source: Ambulatory Visit | Attending: Interventional Cardiology | Admitting: Interventional Cardiology

## 2012-09-26 DIAGNOSIS — I10 Essential (primary) hypertension: Secondary | ICD-10-CM | POA: Insufficient documentation

## 2012-09-26 DIAGNOSIS — Z5189 Encounter for other specified aftercare: Secondary | ICD-10-CM | POA: Insufficient documentation

## 2012-09-26 DIAGNOSIS — I2510 Atherosclerotic heart disease of native coronary artery without angina pectoris: Secondary | ICD-10-CM | POA: Insufficient documentation

## 2012-09-26 NOTE — Progress Notes (Signed)
Cardiac Rehab Medication Review by a Pharmacist  Does the patient  feel that his/her medications are working for him/her?  yes  Has the patient been experiencing any side effects to the medications prescribed?  no  Does the patient measure his/her own blood pressure or blood glucose at home?  Yes    Does the patient have any problems obtaining medications due to transportation or finances?   no  Understanding of regimen: good Understanding of indications: good Potential of compliance: good    Pharmacist comments: Patient had a good understanding of his medications. He brought his current list of medications with him. The lisinopril was not on his current list, and he could not remember if he still takes it. He will report back to [place] on Monday when he returns for rehab.    Mickey Farber 09/26/2012 8:54 AM

## 2012-09-30 ENCOUNTER — Encounter (HOSPITAL_COMMUNITY): Payer: BC Managed Care – PPO

## 2012-09-30 ENCOUNTER — Telehealth (HOSPITAL_COMMUNITY): Payer: Self-pay | Admitting: Geriatric Medicine

## 2012-10-02 ENCOUNTER — Telehealth (HOSPITAL_COMMUNITY): Payer: Self-pay | Admitting: Geriatric Medicine

## 2012-10-02 ENCOUNTER — Encounter (HOSPITAL_COMMUNITY): Payer: BC Managed Care – PPO

## 2012-10-04 ENCOUNTER — Encounter (HOSPITAL_COMMUNITY): Payer: BC Managed Care – PPO

## 2012-10-07 ENCOUNTER — Encounter (HOSPITAL_COMMUNITY): Payer: Self-pay

## 2012-10-07 ENCOUNTER — Encounter (HOSPITAL_COMMUNITY)
Admission: RE | Admit: 2012-10-07 | Discharge: 2012-10-07 | Disposition: A | Discharge status: Home / Self Care | Payer: BC Managed Care – PPO | Source: Ambulatory Visit | Attending: Interventional Cardiology | Admitting: Interventional Cardiology

## 2012-10-07 LAB — GLUCOSE, CAPILLARY
Glucose-Capillary: 113 mg/dL — ABNORMAL HIGH (ref 70–99)
Glucose-Capillary: 86 mg/dL (ref 70–99)

## 2012-10-07 NOTE — Progress Notes (Signed)
Pt started cardiac rehab today.  Pt tolerated light exercise without difficulty.   VSS, telemetry-sinus, BBB.  Asymptomatic.  Pt oriented to exercise equipment and routine.  Understanding verbalized.

## 2012-10-09 ENCOUNTER — Encounter (HOSPITAL_COMMUNITY)
Admission: RE | Admit: 2012-10-09 | Discharge: 2012-10-09 | Disposition: A | Discharge status: Home / Self Care | Payer: BC Managed Care – PPO | Source: Ambulatory Visit | Attending: Interventional Cardiology | Admitting: Interventional Cardiology

## 2012-10-11 ENCOUNTER — Encounter (HOSPITAL_COMMUNITY)
Admission: RE | Admit: 2012-10-11 | Discharge: 2012-10-11 | Disposition: A | Discharge status: Home / Self Care | Payer: BC Managed Care – PPO | Source: Ambulatory Visit | Attending: Interventional Cardiology | Admitting: Interventional Cardiology

## 2012-10-11 LAB — GLUCOSE, CAPILLARY: Glucose-Capillary: 117 mg/dL — ABNORMAL HIGH (ref 70–99)

## 2012-10-14 ENCOUNTER — Encounter (HOSPITAL_COMMUNITY): Payer: BC Managed Care – PPO

## 2012-10-14 DIAGNOSIS — I2510 Atherosclerotic heart disease of native coronary artery without angina pectoris: Secondary | ICD-10-CM | POA: Insufficient documentation

## 2012-10-14 DIAGNOSIS — Z5189 Encounter for other specified aftercare: Secondary | ICD-10-CM | POA: Insufficient documentation

## 2012-10-14 DIAGNOSIS — I10 Essential (primary) hypertension: Secondary | ICD-10-CM | POA: Insufficient documentation

## 2012-10-15 ENCOUNTER — Other Ambulatory Visit: Payer: Self-pay | Admitting: *Deleted

## 2012-10-15 DIAGNOSIS — I2510 Atherosclerotic heart disease of native coronary artery without angina pectoris: Secondary | ICD-10-CM

## 2012-10-16 ENCOUNTER — Ambulatory Visit
Admission: RE | Admit: 2012-10-16 | Discharge: 2012-10-16 | Disposition: A | Discharge status: Home / Self Care | Payer: BC Managed Care – PPO | Source: Ambulatory Visit | Attending: Cardiothoracic Surgery | Admitting: Cardiothoracic Surgery

## 2012-10-16 ENCOUNTER — Encounter (HOSPITAL_COMMUNITY)
Admission: RE | Admit: 2012-10-16 | Discharge: 2012-10-16 | Disposition: A | Discharge status: Home / Self Care | Payer: BC Managed Care – PPO | Source: Ambulatory Visit | Attending: Interventional Cardiology | Admitting: Interventional Cardiology

## 2012-10-16 ENCOUNTER — Encounter: Payer: Self-pay | Admitting: Cardiothoracic Surgery

## 2012-10-16 ENCOUNTER — Ambulatory Visit (INDEPENDENT_AMBULATORY_CARE_PROVIDER_SITE_OTHER): Payer: Self-pay | Admitting: Cardiothoracic Surgery

## 2012-10-16 VITALS — BP 140/80 | HR 74 | Resp 20 | Ht 66.0 in | Wt 226.0 lb

## 2012-10-16 DIAGNOSIS — I2510 Atherosclerotic heart disease of native coronary artery without angina pectoris: Secondary | ICD-10-CM

## 2012-10-16 DIAGNOSIS — Z951 Presence of aortocoronary bypass graft: Secondary | ICD-10-CM

## 2012-10-16 DIAGNOSIS — J90 Pleural effusion, not elsewhere classified: Secondary | ICD-10-CM

## 2012-10-16 LAB — GLUCOSE, CAPILLARY: Glucose-Capillary: 93 mg/dL (ref 70–99)
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
## 2012-10-18 ENCOUNTER — Encounter (HOSPITAL_COMMUNITY)
Admission: RE | Admit: 2012-10-18 | Discharge: 2012-10-18 | Disposition: A | Discharge status: Home / Self Care | Payer: BC Managed Care – PPO | Source: Ambulatory Visit | Attending: Interventional Cardiology | Admitting: Interventional Cardiology

## 2012-10-18 LAB — GLUCOSE, CAPILLARY
Glucose-Capillary: 161 mg/dL — ABNORMAL HIGH (ref 70–99)
Glucose-Capillary: 125 mg/dL — ABNORMAL HIGH (ref 70–99)

## 2012-10-21 ENCOUNTER — Encounter (HOSPITAL_COMMUNITY)
Admission: RE | Admit: 2012-10-21 | Discharge: 2012-10-21 | Disposition: A | Discharge status: Home / Self Care | Payer: BC Managed Care – PPO | Source: Ambulatory Visit | Attending: Interventional Cardiology | Admitting: Interventional Cardiology

## 2012-10-21 LAB — GLUCOSE, CAPILLARY
Glucose-Capillary: 151 mg/dL — ABNORMAL HIGH (ref 70–99)
Glucose-Capillary: 122 mg/dL — ABNORMAL HIGH (ref 70–99)

## 2012-10-23 ENCOUNTER — Encounter (HOSPITAL_COMMUNITY)
Admission: RE | Admit: 2012-10-23 | Discharge: 2012-10-23 | Disposition: A | Discharge status: Home / Self Care | Payer: BC Managed Care – PPO | Source: Ambulatory Visit | Attending: Interventional Cardiology | Admitting: Interventional Cardiology

## 2012-10-23 NOTE — Progress Notes (Signed)
Reviewed home exercise with pt today.  Pt plans to walk at local mall 2-4 days/week outside of [place] for exercise.  He will also add in an extra day of hand weights on days not in [place].  Reviewed THR, pulse, RPE, sign and symptoms, and when to call 911 or MD.  Pt voiced understanding.  Alexia Freestone, MS, ACSM RCEP 10/23/2012 2:56 PM

## 2012-10-25 ENCOUNTER — Encounter (HOSPITAL_COMMUNITY)
Admission: RE | Admit: 2012-10-25 | Discharge: 2012-10-25 | Disposition: A | Discharge status: Home / Self Care | Payer: BC Managed Care – PPO | Source: Ambulatory Visit | Attending: Interventional Cardiology | Admitting: Interventional Cardiology

## 2012-10-28 ENCOUNTER — Encounter (HOSPITAL_COMMUNITY)
Admission: RE | Admit: 2012-10-28 | Discharge: 2012-10-28 | Disposition: A | Discharge status: Home / Self Care | Payer: BC Managed Care – PPO | Source: Ambulatory Visit | Attending: Interventional Cardiology | Admitting: Interventional Cardiology

## 2012-10-28 NOTE — Progress Notes (Signed)
PSYCHOSOCIAL ASSESSMENT  Pt psychosocial assessment reveals no barriers to rehab participation.  Pt quality of life is slightly altered by his  physical constraints which limits his ability to perform tasks as prior to his illness.   Pt reports shortness of breath more than usual however he feels this is improving with cardiac rehab.  Pt reveals a pattern of grief following the death of his spouse 4 years ago in an accident.  Pt exhibits normal grief pattern.  Pt exhibits positive coping skills and has support system from significant other.  Offered emotional support and reassurance.  Will continue to monitor.

## 2012-10-30 ENCOUNTER — Encounter (HOSPITAL_COMMUNITY)
Admission: RE | Admit: 2012-10-30 | Discharge: 2012-10-30 | Disposition: A | Discharge status: Home / Self Care | Payer: BC Managed Care – PPO | Source: Ambulatory Visit | Attending: Interventional Cardiology | Admitting: Interventional Cardiology

## 2012-11-01 ENCOUNTER — Encounter (HOSPITAL_COMMUNITY)
Admission: RE | Admit: 2012-11-01 | Discharge: 2012-11-01 | Disposition: A | Discharge status: Home / Self Care | Payer: BC Managed Care – PPO | Source: Ambulatory Visit | Attending: Interventional Cardiology | Admitting: Interventional Cardiology

## 2012-11-04 ENCOUNTER — Encounter (HOSPITAL_COMMUNITY)
Admission: RE | Admit: 2012-11-04 | Discharge: 2012-11-04 | Disposition: A | Discharge status: Home / Self Care | Payer: BC Managed Care – PPO | Source: Ambulatory Visit | Attending: Interventional Cardiology | Admitting: Interventional Cardiology

## 2012-11-06 ENCOUNTER — Encounter (HOSPITAL_COMMUNITY)
Admission: RE | Admit: 2012-11-06 | Discharge: 2012-11-06 | Disposition: A | Discharge status: Home / Self Care | Payer: BC Managed Care – PPO | Source: Ambulatory Visit | Attending: Interventional Cardiology | Admitting: Interventional Cardiology

## 2012-11-08 ENCOUNTER — Encounter (HOSPITAL_COMMUNITY)
Admission: RE | Admit: 2012-11-08 | Discharge: 2012-11-08 | Disposition: A | Discharge status: Home / Self Care | Payer: BC Managed Care – PPO | Source: Ambulatory Visit | Attending: Interventional Cardiology | Admitting: Interventional Cardiology

## 2012-11-11 ENCOUNTER — Encounter (HOSPITAL_COMMUNITY)
Admission: RE | Admit: 2012-11-11 | Discharge: 2012-11-11 | Disposition: A | Discharge status: Home / Self Care | Payer: BC Managed Care – PPO | Source: Ambulatory Visit | Attending: Interventional Cardiology | Admitting: Interventional Cardiology

## 2012-11-12 ENCOUNTER — Other Ambulatory Visit: Payer: Self-pay | Admitting: Physician Assistant

## 2012-11-13 ENCOUNTER — Encounter (HOSPITAL_COMMUNITY)
Admission: RE | Admit: 2012-11-13 | Discharge: 2012-11-13 | Disposition: A | Discharge status: Home / Self Care | Payer: BC Managed Care – PPO | Source: Ambulatory Visit | Attending: Interventional Cardiology | Admitting: Interventional Cardiology

## 2012-11-13 DIAGNOSIS — Z5189 Encounter for other specified aftercare: Secondary | ICD-10-CM | POA: Insufficient documentation

## 2012-11-13 DIAGNOSIS — I2510 Atherosclerotic heart disease of native coronary artery without angina pectoris: Secondary | ICD-10-CM | POA: Insufficient documentation

## 2012-11-13 DIAGNOSIS — I10 Essential (primary) hypertension: Secondary | ICD-10-CM | POA: Insufficient documentation

## 2012-11-15 ENCOUNTER — Encounter (HOSPITAL_COMMUNITY)
Admission: RE | Admit: 2012-11-15 | Discharge: 2012-11-15 | Disposition: A | Discharge status: Home / Self Care | Payer: BC Managed Care – PPO | Source: Ambulatory Visit | Attending: Interventional Cardiology | Admitting: Interventional Cardiology

## 2012-11-18 ENCOUNTER — Encounter (HOSPITAL_COMMUNITY)
Admission: RE | Admit: 2012-11-18 | Discharge: 2012-11-18 | Disposition: A | Discharge status: Home / Self Care | Payer: BC Managed Care – PPO | Source: Ambulatory Visit | Attending: Interventional Cardiology | Admitting: Interventional Cardiology

## 2012-11-20 ENCOUNTER — Ambulatory Visit: Payer: BC Managed Care – PPO | Admitting: Cardiothoracic Surgery

## 2012-11-20 ENCOUNTER — Encounter (HOSPITAL_COMMUNITY)
Admission: RE | Admit: 2012-11-20 | Discharge: 2012-11-20 | Disposition: A | Discharge status: Home / Self Care | Payer: BC Managed Care – PPO | Source: Ambulatory Visit | Attending: Interventional Cardiology | Admitting: Interventional Cardiology

## 2012-11-22 ENCOUNTER — Encounter (HOSPITAL_COMMUNITY)
Admission: RE | Admit: 2012-11-22 | Discharge: 2012-11-22 | Disposition: A | Discharge status: Home / Self Care | Payer: BC Managed Care – PPO | Source: Ambulatory Visit | Attending: Interventional Cardiology | Admitting: Interventional Cardiology

## 2012-11-22 ENCOUNTER — Encounter (HOSPITAL_COMMUNITY): Payer: Self-pay

## 2012-11-22 NOTE — Progress Notes (Signed)
Pt graduated from cardiac rehab program today.  Medication list reconciled.  PHQ9 score-0.  Pt demonstrates improved quality of life by verbalized fulfillment with returning to his hobbies, especially riding his motorcycle.  Pt is a little apprehensive to return to work however is grateful  his work accommodations will be met.    Pt has made significant lifestyle changes and should be commended for his success. Pt plans to continue exercising on his own.

## 2012-11-22 NOTE — Progress Notes (Signed)
Kenneth Giles 62 y.o. male Nutrition Note Spoke with pt.  Nutrition Plan and Nutrition Survey goals reviewed with pt. Pt is following Step 1 of the Therapeutic Lifestyle Changes diet. Pt wants to lose wt. Pt denies actively trying to lose wt. Wt loss tips reviewed.  Pt is diabetic. Last A1c indicates blood glucose well-controlled. Pt checks fasting CBG's regularly. Per pt, CBG this morning 107 mg/dL. Pt states his fasting CBG's are "rarely over 110 mg/dL." This writer went over Diabetes Education test results. Pt expressed understanding of the information reviewed. Pt aware of nutrition education classes offered and has attended nutrition classes.  Nutrition Diagnosis   Food-and nutrition-related knowledge deficit related to lack of exposure to information as related to diagnosis of: ? CVD ? DM (A1c 6.2)   Obesity related to excessive energy intake as evidenced by a BMI of 36.6  Nutrition RX/ Estimated Daily Nutrition Needs for: wt loss  1500-2000 Kcal, 40-55 gm fat, 9-13 gm sat fat, 1.4-2.0 gm trans-fat, <1500 mg sodium, 175-250 gm CHO   Nutrition Intervention   Pt's individual nutrition plan reviewed with pt.   Benefits of adopting Therapeutic Lifestyle Changes discussed when Medficts reviewed.   Pt to attend the Portion Distortion class   Pt to attend the  ? Nutrition I class - met 11/12/12                    ? Nutrition II class - met 11/19/12       ? Diabetes Q & A class - met 11/22/12   Continue client-centered nutrition education by RD, as part of interdisciplinary care. Goal(s)   Pt to identify and limit food sources of saturated fat, trans fat, and cholesterol   Pt to identify food quantities necessary to achieve: ? wt loss  Monitor and Evaluate progress toward nutrition goal with team. Nutrition Risk: Change to Moderate   Mickle Plumb, M.Ed, RD, LDN, CDE 11/22/2012 1:31 PM

## 2012-11-25 ENCOUNTER — Encounter (HOSPITAL_COMMUNITY): Payer: BC Managed Care – PPO

## 2012-11-25 ENCOUNTER — Other Ambulatory Visit: Payer: Self-pay | Admitting: *Deleted

## 2012-11-25 DIAGNOSIS — I2510 Atherosclerotic heart disease of native coronary artery without angina pectoris: Secondary | ICD-10-CM

## 2012-11-27 ENCOUNTER — Ambulatory Visit
Admission: RE | Admit: 2012-11-27 | Discharge: 2012-11-27 | Disposition: A | Discharge status: Home / Self Care | Payer: BC Managed Care – PPO | Source: Ambulatory Visit | Attending: Surgery | Admitting: Surgery

## 2012-11-27 ENCOUNTER — Ambulatory Visit (INDEPENDENT_AMBULATORY_CARE_PROVIDER_SITE_OTHER): Payer: BC Managed Care – PPO | Admitting: Cardiothoracic Surgery

## 2012-11-27 ENCOUNTER — Encounter: Payer: Self-pay | Admitting: Cardiothoracic Surgery

## 2012-11-27 ENCOUNTER — Encounter (HOSPITAL_COMMUNITY): Payer: BC Managed Care – PPO

## 2012-11-27 VITALS — BP 158/84 | HR 72 | Resp 20 | Ht 66.0 in | Wt 232.0 lb

## 2012-11-27 DIAGNOSIS — I2510 Atherosclerotic heart disease of native coronary artery without angina pectoris: Secondary | ICD-10-CM

## 2012-11-27 DIAGNOSIS — Z951 Presence of aortocoronary bypass graft: Secondary | ICD-10-CM

## 2012-11-27 DIAGNOSIS — J90 Pleural effusion, not elsewhere classified: Secondary | ICD-10-CM
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
## 2012-11-29 ENCOUNTER — Encounter (HOSPITAL_COMMUNITY): Payer: BC Managed Care – PPO

## 2012-12-02 ENCOUNTER — Encounter (HOSPITAL_COMMUNITY): Payer: BC Managed Care – PPO

## 2012-12-04 ENCOUNTER — Encounter (HOSPITAL_COMMUNITY): Payer: BC Managed Care – PPO

## 2012-12-06 ENCOUNTER — Encounter (HOSPITAL_COMMUNITY): Payer: BC Managed Care – PPO

## 2012-12-09 ENCOUNTER — Encounter (HOSPITAL_COMMUNITY): Payer: BC Managed Care – PPO

## 2012-12-11 ENCOUNTER — Encounter (HOSPITAL_COMMUNITY): Payer: BC Managed Care – PPO

## 2012-12-13 ENCOUNTER — Encounter (HOSPITAL_COMMUNITY): Payer: BC Managed Care – PPO

## 2012-12-16 ENCOUNTER — Encounter (HOSPITAL_COMMUNITY): Payer: BC Managed Care – PPO

## 2012-12-18 ENCOUNTER — Encounter (HOSPITAL_COMMUNITY): Payer: BC Managed Care – PPO

## 2012-12-19 ENCOUNTER — Other Ambulatory Visit: Payer: Self-pay

## 2012-12-20 ENCOUNTER — Encounter (HOSPITAL_COMMUNITY): Payer: BC Managed Care – PPO

## 2012-12-23 ENCOUNTER — Encounter (HOSPITAL_COMMUNITY): Payer: BC Managed Care – PPO

## 2012-12-24 ENCOUNTER — Other Ambulatory Visit: Payer: Self-pay | Admitting: Physician Assistant

## 2012-12-25 ENCOUNTER — Encounter (HOSPITAL_COMMUNITY): Payer: BC Managed Care – PPO

## 2012-12-27 ENCOUNTER — Encounter (HOSPITAL_COMMUNITY): Payer: BC Managed Care – PPO

## 2012-12-30 ENCOUNTER — Encounter (HOSPITAL_COMMUNITY): Payer: BC Managed Care – PPO

## 2012-12-31 ENCOUNTER — Telehealth: Payer: Self-pay

## 2012-12-31 MED ORDER — METOPROLOL TARTRATE 100 MG PO TABS: 100.0000 mg | ORAL_TABLET | Freq: Two times a day (BID) | ORAL | Status: DC

## 2012-12-31 NOTE — Telephone Encounter (Signed)
Refilled

## 2012-12-31 NOTE — Addendum Note (Signed)
Addended byOrlene Plum H on: 12/31/2012 01:39 PM   Modules accepted: Orders

## 2013-01-01 ENCOUNTER — Encounter (HOSPITAL_COMMUNITY): Payer: BC Managed Care – PPO

## 2013-01-03 ENCOUNTER — Encounter (HOSPITAL_COMMUNITY): Payer: BC Managed Care – PPO

## 2013-01-25 ENCOUNTER — Encounter: Payer: Self-pay | Admitting: Interventional Cardiology

## 2013-01-27 ENCOUNTER — Encounter: Payer: Self-pay | Admitting: Interventional Cardiology

## 2013-01-27 ENCOUNTER — Ambulatory Visit (INDEPENDENT_AMBULATORY_CARE_PROVIDER_SITE_OTHER): Payer: BC Managed Care – PPO | Admitting: Interventional Cardiology

## 2013-01-27 VITALS — BP 140/56 | HR 72 | Ht 64.0 in | Wt 236.8 lb

## 2013-01-27 DIAGNOSIS — Z951 Presence of aortocoronary bypass graft: Secondary | ICD-10-CM

## 2013-01-27 DIAGNOSIS — I2510 Atherosclerotic heart disease of native coronary artery without angina pectoris: Secondary | ICD-10-CM

## 2013-01-27 DIAGNOSIS — I5022 Chronic systolic (congestive) heart failure: Secondary | ICD-10-CM

## 2013-01-27 DIAGNOSIS — I10 Essential (primary) hypertension: Secondary | ICD-10-CM

## 2013-01-27 NOTE — Progress Notes (Signed)
Patient ID: Kenneth Giles, male   DOB: 02/08/1951, 62 y.o.   MRN: [id number]    [address], [address] [place], [place]  [address] Phone: [phone] Fax:  [phone]  Date:  01/27/2013   ID:  [name], DOB [birth year], MRN [id number]  PCP:  Ginette Otto, MD   ASSESSMENT:  1. coronary artery disease now status post CABG  now 6 months,  without angina 2. Chronic systolic heart failure with LVEF prior to surgery 3. Hypertension  PLAN:  1. 2-D Doppler echocardiogram to reassess LV function and hopefully document improved overall performance 2. Clinical followup in 6 months 3. Low salt diet 4. Encouraged exercise and weight reduction   SUBJECTIVE: Kenneth Giles is a 62 y.o. male who is doing well post bypass surgery with the exception of decreased endurance. He denies chest discomfort, edema, orthopnea, PND, palpitations, and syncope. No chest wall discomfort. All incisions are well healed.   Wt Readings from Last 3 Encounters:  01/27/13 236 lb 12.8 oz (107.412 kg)  11/27/12 232 lb (105.235 kg)  10/16/12 226 lb (102.513 kg)     Past Medical History  Diagnosis Date  . Hypercholesteremia     takes Lipitor daily  . Allergic rhinitis     takes Zyrtec daily  . Chronic low back pain     cause unknown  . Left bundle branch block   . Thrombocytopenia   . Nephrolithiasis   . H/O renal calculi     no treatment needed  . Lipoma of back   . CAD (coronary artery disease)   . Diabetes mellitus     takes Glipizide daily  . Constipation     Citrucel daily  . GERD (gastroesophageal reflux disease)     takes Omeprazole daily  . Hypertension     takes Metoprolol daily and Lisinopril   . Pneumonia     hx of about 10+yrs ago  . Headache(784.0)     rarely   . Peripheral neuropathy   . Peripheral edema     takes Lozol daily  . Arthritis     hands   . History of colon polyps   . Diverticulosis   . Urinary frequency   . Enlarged prostate    slightly  . Nocturia   . History of blood transfusion     no abnormal reaction noted    Current Outpatient Prescriptions  Medication Sig Dispense Refill  . aspirin EC 325 MG tablet TAKE 1 TABLET BY MOUTH EVERY DAY  30 tablet  0  . atorvastatin (LIPITOR) 40 MG tablet Take 40 mg by mouth daily.      [name] B-Complex TABS Take 1 tablet by mouth daily.      . cetirizine (ZYRTEC) 10 MG tablet Take 10 mg by mouth daily.      . fenofibrate micronized (LOFIBRA) 200 MG capsule Take 200 mg by mouth daily before breakfast.      . glipiZIDE (GLUCOTROL XL) 2.5 MG 24 hr tablet Take 5 mg by mouth daily.       [name] ibuprofen (ADVIL,MOTRIN) 200 MG tablet Take 200 mg by mouth 2 (two) times daily. Taking 2-4 tablets every 8hrs as needed for pain.      . indapamide (LOZOL) 1.25 MG tablet Take 1.25 mg by mouth every morning.      [name] lisinopril (PRINIVIL,ZESTRIL) 20 MG tablet Take 1 tablet (20 mg total) by mouth daily.  30 tablet  1  . Methylcellulose, Laxative, (  CITRUCEL PO) Take 500 mg by mouth daily.      . metoprolol (LOPRESSOR) 100 MG tablet Take 1 tablet (100 mg total) by mouth 2 (two) times daily.  60 tablet  0  . Misc Natural Products (GLUCOSAMINE-CHONDROITIN PLUS) TABS Take 1 tablet by mouth daily.      . Misc Natural Products (LUTEIN 20) CAPS Take 6 mg by mouth every evening.       . Multiple Vitamin (MULTIVITAMIN) tablet Take 1 tablet by mouth daily.      . OMEGA 3 1000 MG CAPS Take 1 capsule by mouth daily.      [name] omeprazole (PRILOSEC) 20 MG capsule Take 20 mg by mouth daily.      . potassium chloride SA (K-DUR,KLOR-CON) 20 MEQ tablet Take 20 mEq by mouth daily.      . Vitamin Mixture (ESTER-C PO) Take 500 mg by mouth daily.      . Zinc 25 MG TABS Take 25 mg by mouth every evening.       No current facility-administered medications for this visit.    Allergies:    Allergies  Allergen Reactions  . Tylenol With Codeine #3 [Acetaminophen-Codeine] Nausea Only    Social History:  The patient  reports  that he has never smoked. He has never used smokeless tobacco. He reports that he drinks alcohol. He reports that he does not use illicit drugs.   ROS:  Please see the history of present illness.      All other systems reviewed and negative.   OBJECTIVE: VS:  BP 140/56  Pulse 72  Ht 5\' 4"  (1.626 m)  Wt 236 lb 12.8 oz (107.412 kg)  BMI 40.63 kg/m2 Well nourished, well developed, in no acute distress, obese and bland affect HEENT: normal Neck: JVD flat. Carotid bruit 2+  Cardiac:  normal S1, S2; RRR; no murmur Lungs:  clear to auscultation bilaterally, no wheezing, rhonchi or rales Abd: soft, nontender, no hepatomegaly Ext: Edema absent. Pulses 2+ Skin: warm and dry Neuro:  CNs 2-12 intact, no focal abnormalities noted  EKG:  No new tracing repeated       Signed, Darci Needle III, MD 01/27/2013 3:44 PM  Past Medical History  Hypertension   Diabetes mellitus   hypercholesterolemia/low HDL   Allergic rhinitis   Chronic low back pain   opthal Dr   Left bundle branch block   Mild thrombocytopenia   Nephrolithiasis 04/2010   non-obstructing bilateral renal calculi CT 04/2010   Lipoma right upper back 14x10cm   orthopedist-Dr. Madelon Lips   CAD-4 vessel CABG 08/2012   cardiology-Dr. Katrinka Blazing

## 2013-01-27 NOTE — Patient Instructions (Signed)
Your physician recommends that you continue on your current medications as directed. Please refer to the Current Medication list given to you today.  Your physician wants you to follow-up in: 6 months You will receive a reminder letter in the mail two months in advance. If you don't receive a letter, please call our office to schedule the follow-up appointment.  Your physician has requested that you have an echocardiogram. Echocardiography is a painless test that uses sound waves to create images of your heart. It provides your doctor with information about the size and shape of your heart and how well your heart's chambers and valves are working. This procedure takes approximately one hour. There are no restrictions for this procedure.( To be scheduled between now and before 6 months follow up)

## 2013-01-31 ENCOUNTER — Other Ambulatory Visit: Payer: Self-pay | Admitting: *Deleted

## 2013-01-31 MED ORDER — METOPROLOL TARTRATE 100 MG PO TABS: 100.0000 mg | ORAL_TABLET | Freq: Two times a day (BID) | ORAL | Status: DC

## 2013-04-04 ENCOUNTER — Encounter (HOSPITAL_COMMUNITY)
Admission: EM | Disposition: A | Discharge status: Home / Self Care | Payer: Self-pay | Source: Home / Self Care | Attending: Interventional Cardiology

## 2013-04-04 ENCOUNTER — Encounter (HOSPITAL_COMMUNITY): Payer: Self-pay | Admitting: Emergency Medicine

## 2013-04-04 ENCOUNTER — Inpatient Hospital Stay (HOSPITAL_COMMUNITY)
Admission: EM | Admit: 2013-04-04 | Discharge: 2013-04-05 | DRG: 287 | Disposition: A | Discharge status: Home / Self Care | Payer: BC Managed Care – PPO | Attending: Interventional Cardiology | Admitting: Interventional Cardiology

## 2013-04-04 ENCOUNTER — Emergency Department (HOSPITAL_COMMUNITY): Payer: BC Managed Care – PPO

## 2013-04-04 DIAGNOSIS — I447 Left bundle-branch block, unspecified: Secondary | ICD-10-CM | POA: Diagnosis present

## 2013-04-04 DIAGNOSIS — R509 Fever, unspecified: Secondary | ICD-10-CM | POA: Diagnosis not present

## 2013-04-04 DIAGNOSIS — Z6841 Body Mass Index (BMI) 40.0 and over, adult: Secondary | ICD-10-CM

## 2013-04-04 DIAGNOSIS — I509 Heart failure, unspecified: Secondary | ICD-10-CM | POA: Diagnosis present

## 2013-04-04 DIAGNOSIS — Z8249 Family history of ischemic heart disease and other diseases of the circulatory system: Secondary | ICD-10-CM

## 2013-04-04 DIAGNOSIS — I700 Atherosclerosis of aorta: Secondary | ICD-10-CM | POA: Diagnosis present

## 2013-04-04 DIAGNOSIS — Z885 Allergy status to narcotic agent status: Secondary | ICD-10-CM

## 2013-04-04 DIAGNOSIS — R011 Cardiac murmur, unspecified: Secondary | ICD-10-CM | POA: Diagnosis present

## 2013-04-04 DIAGNOSIS — M545 Low back pain, unspecified: Secondary | ICD-10-CM | POA: Diagnosis present

## 2013-04-04 DIAGNOSIS — D696 Thrombocytopenia, unspecified: Secondary | ICD-10-CM | POA: Diagnosis present

## 2013-04-04 DIAGNOSIS — M19049 Primary osteoarthritis, unspecified hand: Secondary | ICD-10-CM | POA: Diagnosis present

## 2013-04-04 DIAGNOSIS — R079 Chest pain, unspecified: Secondary | ICD-10-CM

## 2013-04-04 DIAGNOSIS — E119 Type 2 diabetes mellitus without complications: Secondary | ICD-10-CM | POA: Diagnosis present

## 2013-04-04 DIAGNOSIS — E669 Obesity, unspecified: Secondary | ICD-10-CM | POA: Diagnosis present

## 2013-04-04 DIAGNOSIS — I10 Essential (primary) hypertension: Secondary | ICD-10-CM | POA: Diagnosis present

## 2013-04-04 DIAGNOSIS — I200 Unstable angina: Secondary | ICD-10-CM | POA: Diagnosis present

## 2013-04-04 DIAGNOSIS — E876 Hypokalemia: Secondary | ICD-10-CM | POA: Diagnosis present

## 2013-04-04 DIAGNOSIS — Z951 Presence of aortocoronary bypass graft: Secondary | ICD-10-CM

## 2013-04-04 DIAGNOSIS — I249 Acute ischemic heart disease, unspecified: Secondary | ICD-10-CM | POA: Diagnosis present

## 2013-04-04 DIAGNOSIS — I5022 Chronic systolic (congestive) heart failure: Secondary | ICD-10-CM | POA: Diagnosis present

## 2013-04-04 DIAGNOSIS — I2510 Atherosclerotic heart disease of native coronary artery without angina pectoris: Principal | ICD-10-CM | POA: Diagnosis present

## 2013-04-04 DIAGNOSIS — G8929 Other chronic pain: Secondary | ICD-10-CM | POA: Diagnosis present

## 2013-04-04 DIAGNOSIS — E785 Hyperlipidemia, unspecified: Secondary | ICD-10-CM | POA: Diagnosis present

## 2013-04-04 DIAGNOSIS — K219 Gastro-esophageal reflux disease without esophagitis: Secondary | ICD-10-CM | POA: Diagnosis present

## 2013-04-04 HISTORY — PX: LEFT HEART CATHETERIZATION WITH CORONARY ANGIOGRAM: SHX5451

## 2013-04-04 LAB — BASIC METABOLIC PANEL
BUN: 16 mg/dL (ref 6–23)
CHLORIDE: 105 meq/L (ref 96–112)
CO2: 22 mEq/L (ref 19–32)
Calcium: 9 mg/dL (ref 8.4–10.5)
Creatinine, Ser: 0.93 mg/dL (ref 0.50–1.35)
GFR calc non Af Amer: 89 mL/min — ABNORMAL LOW (ref >90)
Glucose, Bld: 154 mg/dL — ABNORMAL HIGH (ref 70–99)
POTASSIUM: 3.5 meq/L — AB (ref 3.7–5.3)
Sodium: 145 mEq/L (ref 137–147)

## 2013-04-04 LAB — GLUCOSE, CAPILLARY
GLUCOSE-CAPILLARY: 140 mg/dL — AB (ref 70–99)
Glucose-Capillary: 136 mg/dL — ABNORMAL HIGH (ref 70–99)
Glucose-Capillary: 102 mg/dL — ABNORMAL HIGH (ref 70–99)
Glucose-Capillary: 111 mg/dL — ABNORMAL HIGH (ref 70–99)
Glucose-Capillary: 100 mg/dL — ABNORMAL HIGH (ref 70–99)

## 2013-04-04 LAB — CBC
HCT: 48.5 % (ref 39.0–52.0)
Hemoglobin: 17.1 g/dL — ABNORMAL HIGH (ref 13.0–17.0)
MCH: 29.1 pg (ref 26.0–34.0)
MCHC: 35.3 g/dL (ref 30.0–36.0)
MCV: 82.5 fL (ref 78.0–100.0)
Platelets: 103 K/uL — ABNORMAL LOW (ref 150–400)
RBC: 5.88 MIL/uL — ABNORMAL HIGH (ref 4.22–5.81)
RDW: 15 % (ref 11.5–15.5)
WBC: 4.7 K/uL (ref 4.0–10.5)

## 2013-04-04 LAB — URINALYSIS, ROUTINE W REFLEX MICROSCOPIC
Bilirubin Urine: NEGATIVE
Glucose, UA: NEGATIVE mg/dL
Hgb urine dipstick: NEGATIVE
Ketones, ur: NEGATIVE mg/dL
Leukocytes, UA: NEGATIVE
Nitrite: NEGATIVE
Protein, ur: NEGATIVE mg/dL
Specific Gravity, Urine: 1.035 — ABNORMAL HIGH (ref 1.005–1.030)
Urobilinogen, UA: 1 mg/dL (ref 0.0–1.0)
pH: 7 (ref 5.0–8.0)

## 2013-04-04 LAB — TROPONIN I: Troponin I: <0.3 ng/mL

## 2013-04-04 LAB — I-STAT TROPONIN, ED: Troponin i, poc: 0.02 ng/mL (ref 0.00–0.08)

## 2013-04-04 LAB — MAGNESIUM: Magnesium: 1.6 mg/dL (ref 1.5–2.5)

## 2013-04-04 LAB — HEMOGLOBIN A1C
Hgb A1c MFr Bld: 6.1 % — ABNORMAL HIGH (ref <5.7)
Mean Plasma Glucose: 128 mg/dL — ABNORMAL HIGH (ref <117)

## 2013-04-04 LAB — PROTIME-INR
INR: 1.14 (ref 0.00–1.49)
PROTHROMBIN TIME: 14.4 s (ref 11.6–15.2)

## 2013-04-04 LAB — MRSA PCR SCREENING: MRSA by PCR: NEGATIVE

## 2013-04-04 LAB — POCT ACTIVATED CLOTTING TIME: ACTIVATED CLOTTING TIME: 116 s

## 2013-04-04 SURGERY — LEFT HEART CATHETERIZATION WITH CORONARY ANGIOGRAM
Anesthesia: LOCAL

## 2013-04-04 MED ORDER — SODIUM CHLORIDE 0.9 % IJ SOLN: 3.0000 mL | INTRAMUSCULAR | Status: DC | PRN

## 2013-04-04 MED ORDER — ACETAMINOPHEN 325 MG PO TABS
650.0000 mg | ORAL_TABLET | ORAL | Status: DC | PRN
  Administered 2013-04-04: 650 mg via ORAL
  Filled 2013-04-04: qty 2

## 2013-04-04 MED ORDER — NITROGLYCERIN IN D5W 200-5 MCG/ML-% IV SOLN
5.0000 ug/min | INTRAVENOUS | Status: DC
  Administered 2013-04-04: 15 ug/min via INTRAVENOUS
  Administered 2013-04-04: 5 ug/min via INTRAVENOUS
  Filled 2013-04-04: qty 250

## 2013-04-04 MED ORDER — ONDANSETRON HCL 4 MG/2ML IJ SOLN
INTRAMUSCULAR | Status: AC
  Filled 2013-04-04: qty 2

## 2013-04-04 MED ORDER — SODIUM CHLORIDE 0.9 % IV SOLN
INTRAVENOUS | Status: DC
  Administered 2013-04-04 (×2): via INTRAVENOUS

## 2013-04-04 MED ORDER — MORPHINE SULFATE 2 MG/ML IJ SOLN
2.0000 mg | INTRAMUSCULAR | Status: DC | PRN
  Administered 2013-04-04: 2 mg via INTRAVENOUS
  Filled 2013-04-04: qty 1

## 2013-04-04 MED ORDER — SODIUM CHLORIDE 0.9 % IV SOLN
INTRAVENOUS | Status: DC
  Administered 2013-04-04: 07:00:00 via INTRAVENOUS

## 2013-04-04 MED ORDER — POTASSIUM CHLORIDE CRYS ER 20 MEQ PO TBCR
40.0000 meq | EXTENDED_RELEASE_TABLET | Freq: Once | ORAL | Status: AC
  Administered 2013-04-04: 40 meq via ORAL
  Filled 2013-04-04: qty 2

## 2013-04-04 MED ORDER — FENOFIBRATE 160 MG PO TABS
160.0000 mg | ORAL_TABLET | Freq: Every day | ORAL | Status: DC
  Administered 2013-04-04 – 2013-04-05 (×2): 160 mg via ORAL
  Filled 2013-04-04 (×2): qty 1

## 2013-04-04 MED ORDER — HEPARIN BOLUS VIA INFUSION
4000.0000 [IU] | Freq: Once | INTRAVENOUS | Status: AC
  Administered 2013-04-04: 4000 [IU] via INTRAVENOUS
  Filled 2013-04-04: qty 4000

## 2013-04-04 MED ORDER — PANTOPRAZOLE SODIUM 40 MG PO TBEC
40.0000 mg | DELAYED_RELEASE_TABLET | Freq: Every day | ORAL | Status: DC
  Administered 2013-04-04 – 2013-04-05 (×2): 40 mg via ORAL
  Filled 2013-04-04 (×2): qty 1

## 2013-04-04 MED ORDER — NITROGLYCERIN 0.4 MG SL SUBL: 0.4000 mg | SUBLINGUAL_TABLET | SUBLINGUAL | Status: DC | PRN

## 2013-04-04 MED ORDER — ZOLPIDEM TARTRATE 5 MG PO TABS: 5.0000 mg | ORAL_TABLET | Freq: Every evening | ORAL | Status: DC | PRN

## 2013-04-04 MED ORDER — INSULIN ASPART 100 UNIT/ML ~~LOC~~ SOLN
0.0000 [IU] | Freq: Three times a day (TID) | SUBCUTANEOUS | Status: DC
  Administered 2013-04-04 (×2): 1 [IU] via SUBCUTANEOUS
  Administered 2013-04-05: 2 [IU] via SUBCUTANEOUS

## 2013-04-04 MED ORDER — HEPARIN (PORCINE) IN NACL 2-0.9 UNIT/ML-% IJ SOLN
INTRAMUSCULAR | Status: AC
  Filled 2013-04-04: qty 1000

## 2013-04-04 MED ORDER — MIDAZOLAM HCL 2 MG/2ML IJ SOLN
INTRAMUSCULAR | Status: AC
  Filled 2013-04-04: qty 2

## 2013-04-04 MED ORDER — OMEGA 3 1000 MG PO CAPS: 1.0000 | ORAL_CAPSULE | Freq: Every day | ORAL | Status: DC

## 2013-04-04 MED ORDER — LIDOCAINE HCL (PF) 1 % IJ SOLN
INTRAMUSCULAR | Status: AC
  Filled 2013-04-04: qty 30

## 2013-04-04 MED ORDER — LISINOPRIL 20 MG PO TABS
20.0000 mg | ORAL_TABLET | Freq: Every day | ORAL | Status: DC
  Administered 2013-04-04 – 2013-04-05 (×2): 20 mg via ORAL
  Filled 2013-04-04 (×2): qty 1

## 2013-04-04 MED ORDER — SODIUM CHLORIDE 0.9 % IV SOLN: 250.0000 mL | INTRAVENOUS | Status: DC | PRN

## 2013-04-04 MED ORDER — SODIUM CHLORIDE 0.9 % IV SOLN
1.0000 mL/kg/h | INTRAVENOUS | Status: DC
  Administered 2013-04-04: 1 mL/kg/h via INTRAVENOUS

## 2013-04-04 MED ORDER — NITROGLYCERIN 0.2 MG/ML ON CALL CATH LAB
INTRAVENOUS | Status: AC
  Filled 2013-04-04: qty 1

## 2013-04-04 MED ORDER — METOPROLOL TARTRATE 100 MG PO TABS
100.0000 mg | ORAL_TABLET | Freq: Two times a day (BID) | ORAL | Status: DC
  Administered 2013-04-04 – 2013-04-05 (×3): 100 mg via ORAL
  Filled 2013-04-04 (×4): qty 1

## 2013-04-04 MED ORDER — ASPIRIN EC 81 MG PO TBEC
81.0000 mg | DELAYED_RELEASE_TABLET | Freq: Every day | ORAL | Status: DC
  Administered 2013-04-05: 81 mg via ORAL
  Filled 2013-04-04: qty 1

## 2013-04-04 MED ORDER — FENTANYL CITRATE 0.05 MG/ML IJ SOLN
INTRAMUSCULAR | Status: AC
  Filled 2013-04-04: qty 2

## 2013-04-04 MED ORDER — SODIUM CHLORIDE 0.9 % IJ SOLN
3.0000 mL | Freq: Two times a day (BID) | INTRAMUSCULAR | Status: DC
  Administered 2013-04-04: 3 mL via INTRAVENOUS

## 2013-04-04 MED ORDER — HEPARIN (PORCINE) IN NACL 100-0.45 UNIT/ML-% IJ SOLN
1250.0000 [IU]/h | INTRAMUSCULAR | Status: DC
  Administered 2013-04-04: 1250 [IU]/h via INTRAVENOUS
  Filled 2013-04-04: qty 250

## 2013-04-04 MED ORDER — ALPRAZOLAM 0.25 MG PO TABS: 0.2500 mg | ORAL_TABLET | Freq: Two times a day (BID) | ORAL | Status: DC | PRN

## 2013-04-04 MED ORDER — OMEGA-3-ACID ETHYL ESTERS 1 G PO CAPS
1.0000 g | ORAL_CAPSULE | Freq: Every day | ORAL | Status: DC
  Administered 2013-04-04 – 2013-04-05 (×2): 1 g via ORAL
  Filled 2013-04-04 (×2): qty 1

## 2013-04-04 MED ORDER — ATORVASTATIN CALCIUM 80 MG PO TABS
80.0000 mg | ORAL_TABLET | Freq: Every day | ORAL | Status: DC
  Filled 2013-04-04 (×3): qty 1

## 2013-04-04 MED ORDER — GI COCKTAIL ~~LOC~~
30.0000 mL | Freq: Once | ORAL | Status: AC
  Administered 2013-04-04: 30 mL via ORAL
  Filled 2013-04-04: qty 30

## 2013-04-04 MED ORDER — MAGNESIUM SULFATE 40 MG/ML IJ SOLN
2.0000 g | Freq: Once | INTRAMUSCULAR | Status: AC
  Administered 2013-04-04: 2 g via INTRAVENOUS
  Filled 2013-04-04: qty 50

## 2013-04-04 MED ORDER — ONDANSETRON HCL 4 MG/2ML IJ SOLN
4.0000 mg | Freq: Four times a day (QID) | INTRAMUSCULAR | Status: DC | PRN
  Administered 2013-04-04: 4 mg via INTRAVENOUS

## 2013-04-04 NOTE — Interval H&P Note (Signed)
History and Physical Interval Note:  04/04/2013 10:01 AM  Kenneth Giles  has presented today for surgery, with the diagnosis of cp  The various methods of treatment have been discussed with the patient and family. After consideration of risks, benefits and other options for treatment, the patient has consented to  Procedure(s): LEFT HEART CATHETERIZATION WITH CORONARY ANGIOGRAM (N/A) as a surgical intervention .  The patient's history has been reviewed, patient examined, no change in status, stable for surgery.  I have reviewed the patient's chart and labs.  Questions were answered to the patient's satisfaction.     [name]

## 2013-04-04 NOTE — H&P (View-Only) (Signed)
Chest pain is atypical. Has a louder murmur at RUSB. Probably needs cath but after echo this AM.SB than bef

## 2013-04-04 NOTE — Interval H&P Note (Signed)
History and Physical Interval Note:  04/04/2013 10:02 AM  Nada Maclachlan  has presented today for surgery, with the diagnosis of cp  The various methods of treatment have been discussed with the patient and family. After consideration of risks, benefits and other options for treatment, the patient has consented to  Procedure(s): LEFT HEART CATHETERIZATION WITH CORONARY ANGIOGRAM (N/A) and possible PCICath Lab Visit (complete for each Cath Lab visit)  Clinical Evaluation Leading to the Procedure:   ACS: no  Non-ACS:    Anginal Classification: CCS IV  Anti-ischemic medical therapy: Maximal Therapy (2 or more classes of medications)  Non-Invasive Test Results: No non-invasive testing performed  Prior CABG: Previous CABG       as a surgical intervention .  The patient's history has been reviewed, patient examined, no change in status, stable for surgery.  I have reviewed the patient's chart and labs.  Questions were answered to the patient's satisfaction.     KELLY,THOMAS A

## 2013-04-04 NOTE — CV Procedure (Signed)
Kenneth Giles is a 63 y.o. male    [id number]  [id number] LOCATION:  FACILITY: [facility]  PHYSICIAN: Troy Sine, MD, [facility] 1950-11-12   DATE OF PROCEDURE:  04/04/2013    CARDIAC CATHETERIZATION     HISTORY: Kenneth Giles is a 63 year old male who underwent CABG revascularization surgery x4 in July 2014 by Dr.Van Tright with a LIMA to the LAD, left radial artery to the right coronary artery, saphenous vein graft to the OM1, and saphenous vein graft to the diagonal. The patient had an ejection fraction of approximately 30-35% preoperatively, and had a history of hypertension hyperlipidemia and diabetes mellitus. The patient was admitted hospital with recurrent chest pain with some  atypical features. He developed recurrent chest pressure this morning with shortness of breath. He has a left bundle branch block rhythm as a result was referred for repeat cardiac catheterization.   PROCEDURE:  The patient was brought to the second floor  Cardiac cath lab in the postabsorptive state. He was premedicated with Versed 2 mg and fentanyl 50 mcg. His right groin was prepped and shaved in usual sterile fashion. Xylocaine 1% was used for local anesthesia. A 5 French sheath was inserted into the right femoral  artery. Diagnostic catheterizatiion was done with 5 [place] LF4, FR4, LIMA and pigtail catheters. Left ventriculography was done with 25 cc Omnipaque contrast. Hemostasis was obtained by direct manual compression. The patient tolerated the procedure well.   HEMODYNAMICS:   Central Aorta: 160/86    Left Ventricle: 160/18/30  ANGIOGRAPHY:  1. Left main: Angiographically normal and bifurcated into LAD and left circumflex coronary artery  2. LAD: Moderate size vessel that had 80% proximal stenosis in the region of the first diagonal takeoff. The facet phenomena was seen after the second septal perforating artery. 3. Left circumflex: Diffuse 70% mid stenosis with an occluded  marginal.  4. Right coronary artery: Diffuse 70-80% mid RCA stenoses 5.LIMA - LAD: Patent graft supplying the mid LAD 6. SVG - Diagonal: Patent 7. SVG - OM: Patent 8. Radial graft - RCA: Patent  Left ventriculography real revealed an ejection fraction of approximately 40%. There was hypokinesis involving the inferior wall and distal anterior wall   IMPRESSION:  Three vessel native coronary artery disease with 80% proximal LAD stenosis; diffuse 70% circumflex stenosis with an occluded marginal vessel; and diffuse 70-80% mid RCA stenoses  Patent LIMA graft supplying the mid LAD  Patent saphenous vein graft supplying the diagonal vessel  Patent saphenous vein graft supplying the circumflex marginal vessel  Patent radial graft supplying the distal right coronary artery  Moderately reduced LV function with an ejection fraction of approximately 40% hypocontractility involving the inferior wall and distal anterolateral wall  Troy Sine, MD, [facility] 04/04/2013 5:42 PM
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
## 2013-04-04 NOTE — Progress Notes (Signed)
6160 04/04/2013   To cath lab  Kenneth Giles, [name]

## 2013-04-04 NOTE — Progress Notes (Signed)
Subjective: Kenneth Giles is a 62 yo pmh CAD s/p 4V CABG, systolic HF (ef 30-35%), HTN, HLD, DM p/w NSTEMI.   Pt continues to have ongoing CP/pressure this AM with some SOB and nausea. This continued despite increasing ntg gtt and giving morphine and zofran. EKG with large LBBB therefore hard to interpret.   Objective: Vital signs in last 24 hours: Filed Vitals:   04/04/13 0600 04/04/13 0635 04/04/13 0645 04/04/13 0700  BP: 122/59 157/62 144/64 117/52  Pulse: 79 80 79 78  Temp:  99 F (37.2 C)    TempSrc:  Oral    Resp: 26 20 22 24  Height:  5' 4.17" (1.63 m)    Weight:  235 lb 14.3 oz (107 kg)    SpO2: 96% 95% 93% 93%   Weight change:   Intake/Output Summary (Last 24 hours) at 04/04/13 0724 Last data filed at 04/04/13 0700  Gross per 24 hour  Intake   28.9 ml  Output      0 ml  Net   28.9 ml   General: resting in bed, uncomfortable, diaphoretic HEENT: PERRL, EOMI, no scleral icterus Cardiac: RRR, no rubs, 3/6 systolic murmur, no gallops Pulm: clear to auscultation bilaterally, moving normal volumes of air Abd: soft, nontender, nondistended, BS present Ext: warm and well perfused, no pedal edema Neuro: alert and oriented X3, cranial nerves II-XII grossly intact  Lab Results: Basic Metabolic Panel:  Recent Labs Lab 04/04/13 0221  NA 145  K 3.5*  CL 105  CO2 22  GLUCOSE 154*  BUN 16  CREATININE 0.93  CALCIUM 9.0   CBC:  Recent Labs Lab 04/04/13 0221  WBC 4.7  HGB 17.1*  HCT 48.5  MCV 82.5  PLT 103*   Cardiac Enzymes:  Recent Labs Lab 04/04/13 0221  TROPONINI <0.30    Cardiac Studies:  LHC 08/2012: severe 3 vessel coronary artery disease, including 90% proximal RCA stenosis, 80% to 90% proximal LAD stenosis, 80% stenosis of the ramus intermediate, and EF 30%.  Myoview 07/2012: intermediate to high risk study with anterior and inferior infarct/ischemia and decreased EF of 30-35%  Studies/Results: Dg Chest Port 1 View  04/04/2013   CLINICAL DATA:   Left-sided chest pain, slight fever.  Hypertension.  EXAM: PORTABLE CHEST - 1 VIEW  COMPARISON:  DG CHEST 2 VIEW dated 11/27/2012  FINDINGS: Cardiac silhouette appears moderately enlarged, status post median sternotomy for apparent coronary artery bypass grafting. Chronic interstitial changes (decreased interstitial prominence from prior examination). Blunting of the left costophrenic angle now seen . No pneumothorax.  Multiple EKG lines overlie the patient and may obscure subtle underlying pathology. Soft tissue planes and included osseous structures are nonsuspicious. Mild degenerative change of thoracic spine.  IMPRESSION: Stable cardiomegaly, with blunting of the left costophrenic angle, which could reflect epicardial fat pad or small pleural effusion. Consider follow-up PA and lateral views the chest when clinically able.   Electronically Signed   By: Courtnay  Bloomer   On: 04/04/2013 01:55   Medications: I have reviewed the patient's current medications. Scheduled Meds: . [START ON 04/05/2013] aspirin EC  81 mg Oral Daily  . atorvastatin  80 mg Oral q1800  . fenofibrate  160 mg Oral Daily  . insulin aspart  0-9 Units Subcutaneous TID WC  . lisinopril  20 mg Oral Daily  . metoprolol  100 mg Oral BID  . omega-3 acid ethyl esters  1 g Oral Daily  . pantoprazole  40 mg Oral Daily   Continuous   Infusions: . sodium chloride 50 mL/hr at 04/04/13 0637  . heparin 1,250 Units/hr (04/04/13 0721)  . nitroGLYCERIN 20 mcg/min (04/04/13 0649)   PRN Meds:.nitroGLYCERIN Assessment/Plan: #NSTEMI: Pt continues to have ongoing CP/pressure and nausea this AM despite morphine and increased ntg gtt. Therefore decision to go to [facility] 04/04/13.     LOS: 0 days   Clinton Gallant, MD 04/04/2013, 7:24 AM Patient seen and examined and history reviewed. Agree with above findings and plan. The patient continues to have active chest pain despite IV heparin, Ntg, and morphine. Describes this as a pressure pain. Did not have  chest pain with initial presentation prior to CABG. Pain is not reproducible by exam. Ecg shows LBBB- not interpretable for ischemia. Needs urgent cardiac cath. Potassium repleted. Will need right groin approach since left radial artery used for graft.  The procedure and risks were reviewed including but not limited to death, myocardial infarction, stroke, arrythmias, bleeding, transfusion, emergency surgery, dye allergy, or renal dysfunction. The patient voices understanding and is agreeable to proceed. If cath is unremarkable will need to consider other causes of chest pain.   [facility] 04/04/2013 8:23 AM

## 2013-04-04 NOTE — Progress Notes (Signed)
ANTICOAGULATION CONSULT NOTE - Initial Consult  Pharmacy Consult for Heparin Indication: chest pain/ACS  Allergies  Allergen Reactions  . Tylenol With Codeine #3 [Acetaminophen-Codeine] Nausea Only    Patient Measurements: Height: 5' 4.17" (163 cm) Weight: 235 lb 14.3 oz (107 kg) (01/2013) IBW/kg (Calculated) : 59.6 Heparin Dosing Weight: 85 kg  Vital Signs: Temp: 99 F (37.2 C) (02/20 0635) Temp src: Oral (02/20 0635) BP: 157/62 mmHg (02/20 0635) Pulse Rate: 80 (02/20 0635)  Labs:  Recent Labs  04/04/13 0221  HGB 17.1*  HCT 48.5  PLT 103*  CREATININE 0.93  TROPONINI <0.30    Estimated Creatinine Clearance: 93.9 ml/min (by C-G formula based on Cr of 0.93).   Medical History: Past Medical History  Diagnosis Date  . Hypercholesteremia     takes Lipitor daily  . Allergic rhinitis     takes Zyrtec daily  . Chronic low back pain     cause unknown  . Left bundle branch block   . Thrombocytopenia   . Nephrolithiasis   . H/O renal calculi     no treatment needed  . Lipoma of back   . CAD (coronary artery disease)   . Diabetes mellitus     takes Glipizide daily  . Constipation     Citrucel daily  . GERD (gastroesophageal reflux disease)     takes Omeprazole daily  . Hypertension     takes Metoprolol daily and Lisinopril   . Pneumonia     hx of about 10+yrs ago  . Headache(784.0)     rarely   . Peripheral neuropathy   . Peripheral edema     takes Lozol daily  . Arthritis     hands   . History of colon polyps   . Diverticulosis   . Urinary frequency   . Enlarged prostate     slightly  . Nocturia   . History of blood transfusion     no abnormal reaction noted    Medications:  Prescriptions prior to admission  Medication Sig Dispense Refill  . aspirin EC 325 MG tablet Take 325 mg by mouth daily.      [name] B-Complex TABS Take 1 tablet by mouth daily.      . fenofibrate micronized (LOFIBRA) 200 MG capsule Take 200 mg by mouth daily before breakfast.       . glipiZIDE (GLUCOTROL XL) 2.5 MG 24 hr tablet Take 5 mg by mouth daily.       [name] ibuprofen (ADVIL,MOTRIN) 200 MG tablet Take 200 mg by mouth 2 (two) times daily.      . indapamide (LOZOL) 1.25 MG tablet Take 1.25 mg by mouth every morning.      [name] lisinopril (PRINIVIL,ZESTRIL) 20 MG tablet Take 1 tablet (20 mg total) by mouth daily.  30 tablet  1  . Methylcellulose, Laxative, (CITRUCEL PO) Take 500 mg by mouth daily.      . metoprolol (LOPRESSOR) 100 MG tablet Take 1 tablet (100 mg total) by mouth 2 (two) times daily.  60 tablet  5  . Misc Natural Products (GLUCOSAMINE-CHONDROITIN PLUS) TABS Take 1 tablet by mouth daily.      . Misc Natural Products (LUTEIN 20) CAPS Take 6 mg by mouth every evening.       . Multiple Vitamin (MULTIVITAMIN) tablet Take 1 tablet by mouth daily.      . nitroGLYCERIN (NITROSTAT) 0.4 MG SL tablet Place 0.4 mg under the tongue every 5 (five) minutes as needed for chest pain.      [name]  OMEGA 3 1000 MG CAPS Take 1 capsule by mouth daily.      [name] omeprazole (PRILOSEC) 20 MG capsule Take 20 mg by mouth daily.      . potassium chloride SA (K-DUR,KLOR-CON) 20 MEQ tablet Take 20 mEq by mouth daily.      . Vitamin Mixture (ESTER-C PO) Take 500 mg by mouth daily.      . Zinc 25 MG TABS Take 25 mg by mouth every evening.        Assessment: 63 yo male with chest pain for heparin  Goal of Therapy:  Heparin level 0.3-0.7 units/ml Monitor platelets by anticoagulation protocol: Yes   Plan:  Heparin 4000 units IV bolus, then 1250 units/hr Check heparin level in 6 hours.   Adden Strout, Bronson Curb 04/04/2013,6:37 AM

## 2013-04-04 NOTE — Progress Notes (Signed)
Utilization review completed. Snow Peoples, RN, BSN. 

## 2013-04-04 NOTE — H&P (View-Only) (Signed)
Subjective: Kenneth Giles is a 63 yo pmh CAD s/p 4V CABG, systolic HF (ef 51-76%), HTN, HLD, DM p/w NSTEMI.   Pt continues to have ongoing CP/pressure this AM with some SOB and nausea. This continued despite increasing ntg gtt and giving morphine and zofran. EKG with large LBBB therefore hard to interpret.   Objective: Vital signs in last 24 hours: Filed Vitals:   04/04/13 0600 04/04/13 0635 04/04/13 0645 04/04/13 0700  BP: 122/59 157/62 144/64 117/52  Pulse: 79 80 79 78  Temp:  99 F (37.2 C)    TempSrc:  Oral    Resp: 26 20 22 24   Height:  5' 4.17" (1.63 m)    Weight:  235 lb 14.3 oz (107 kg)    SpO2: 96% 95% 93% 93%   Weight change:   Intake/Output Summary (Last 24 hours) at 04/04/13 0724 Last data filed at 04/04/13 0700  Gross per 24 hour  Intake   28.9 ml  Output      0 ml  Net   28.9 ml   General: resting in bed, uncomfortable, diaphoretic HEENT: PERRL, EOMI, no scleral icterus Cardiac: RRR, no rubs, 3/6 systolic murmur, no gallops Pulm: clear to auscultation bilaterally, moving normal volumes of air Abd: soft, nontender, nondistended, BS present Ext: warm and well perfused, no pedal edema Neuro: alert and oriented X3, cranial nerves II-XII grossly intact  Lab Results: Basic Metabolic Panel:  Recent Labs Lab 04/04/13 0221  NA 145  K 3.5*  CL 105  CO2 22  GLUCOSE 154*  BUN 16  CREATININE 0.93  CALCIUM 9.0   CBC:  Recent Labs Lab 04/04/13 0221  WBC 4.7  HGB 17.1*  HCT 48.5  MCV 82.5  PLT 103*   Cardiac Enzymes:  Recent Labs Lab 04/04/13 0221  TROPONINI <0.30    Cardiac Studies:  LHC 08/2012: severe 3 vessel coronary artery disease, including 90% proximal RCA stenosis, 80% to 90% proximal LAD stenosis, 80% stenosis of the ramus intermediate, and EF 30%.  Myoview 07/2012: intermediate to high risk study with anterior and inferior infarct/ischemia and decreased EF of 30-35%  Studies/Results: Dg Chest Port 1 View  04/04/2013   CLINICAL DATA:   Left-sided chest pain, slight fever.  Hypertension.  EXAM: PORTABLE CHEST - 1 VIEW  COMPARISON:  DG CHEST 2 VIEW dated 11/27/2012  FINDINGS: Cardiac silhouette appears moderately enlarged, status post median sternotomy for apparent coronary artery bypass grafting. Chronic interstitial changes (decreased interstitial prominence from prior examination). Blunting of the left costophrenic angle now seen . No pneumothorax.  Multiple EKG lines overlie the patient and may obscure subtle underlying pathology. Soft tissue planes and included osseous structures are nonsuspicious. Mild degenerative change of thoracic spine.  IMPRESSION: Stable cardiomegaly, with blunting of the left costophrenic angle, which could reflect epicardial fat pad or small pleural effusion. Consider follow-up PA and lateral views the chest when clinically able.   Electronically Signed   By: Elon Alas   On: 04/04/2013 01:55   Medications: I have reviewed the patient's current medications. Scheduled Meds: . [START ON 04/05/2013] aspirin EC  81 mg Oral Daily  . atorvastatin  80 mg Oral q1800  . fenofibrate  160 mg Oral Daily  . insulin aspart  0-9 Units Subcutaneous TID WC  . lisinopril  20 mg Oral Daily  . metoprolol  100 mg Oral BID  . omega-3 acid ethyl esters  1 g Oral Daily  . pantoprazole  40 mg Oral Daily   Continuous  Infusions: . sodium chloride 50 mL/hr at 04/04/13 0637  . heparin 1,250 Units/hr (04/04/13 0721)  . nitroGLYCERIN 20 mcg/min (04/04/13 0649)   PRN Meds:.nitroGLYCERIN Assessment/Plan: #NSTEMI: Pt continues to have ongoing CP/pressure and nausea this AM despite morphine and increased ntg gtt. Therefore decision to go to Kenneth Giles 04/04/13.     LOS: 0 days   Clinton Gallant, MD 04/04/2013, 7:24 AM Patient seen and examined and history reviewed. Agree with above findings and plan. The patient continues to have active chest pain despite IV heparin, Ntg, and morphine. Describes this as a pressure pain. Did not have  chest pain with initial presentation prior to CABG. Pain is not reproducible by exam. Ecg shows LBBB- not interpretable for ischemia. Needs urgent cardiac cath. Potassium repleted. Will need right groin approach since left radial artery used for graft.  The procedure and risks were reviewed including but not limited to death, myocardial infarction, stroke, arrythmias, bleeding, transfusion, emergency surgery, dye allergy, or renal dysfunction. The patient voices understanding and is agreeable to proceed. If cath is unremarkable will need to consider other causes of chest pain.   [facility] 04/04/2013 8:23 AM

## 2013-04-04 NOTE — Progress Notes (Signed)
Chest pain is atypical. Has a louder murmur at RUSB. Probably needs cath but after echo this AM.SB than bef 
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
## 2013-04-04 NOTE — ED Notes (Signed)
Pt asleep.

## 2013-04-04 NOTE — ED Notes (Signed)
Pt. Having c/p that started @ 2000 on 04/03/13. Pt. Took x1 ntg. Sl. 0.4 mg and 324 asa and EMS gave x 1 ntg. Sl. 0.4 mg. Pain level is a 3/10.

## 2013-04-05 ENCOUNTER — Inpatient Hospital Stay (HOSPITAL_COMMUNITY): Payer: BC Managed Care – PPO

## 2013-04-05 DIAGNOSIS — I369 Nonrheumatic tricuspid valve disorder, unspecified: Secondary | ICD-10-CM

## 2013-04-05 LAB — LIPID PANEL
Cholesterol: 106 mg/dL (ref 0–200)
HDL: 22 mg/dL — ABNORMAL LOW (ref >39)
LDL Cholesterol: 57 mg/dL (ref 0–99)
Total CHOL/HDL Ratio: 4.8 RATIO
Triglycerides: 136 mg/dL (ref <150)
VLDL: 27 mg/dL (ref 0–40)

## 2013-04-05 LAB — BASIC METABOLIC PANEL
BUN: 16 mg/dL (ref 6–23)
CO2: 25 mEq/L (ref 19–32)
Calcium: 8.6 mg/dL (ref 8.4–10.5)
Chloride: 103 mEq/L (ref 96–112)
Creatinine, Ser: 1.11 mg/dL (ref 0.50–1.35)
GFR calc Af Amer: 82 mL/min — ABNORMAL LOW (ref >90)
GFR calc non Af Amer: 70 mL/min — ABNORMAL LOW (ref >90)
GLUCOSE: 110 mg/dL — AB (ref 70–99)
POTASSIUM: 3.6 meq/L — AB (ref 3.7–5.3)
Sodium: 141 mEq/L (ref 137–147)

## 2013-04-05 LAB — CBC
HEMATOCRIT: 45.3 % (ref 39.0–52.0)
HEMOGLOBIN: 15.3 g/dL (ref 13.0–17.0)
MCH: 28.3 pg (ref 26.0–34.0)
MCHC: 33.8 g/dL (ref 30.0–36.0)
MCV: 83.7 fL (ref 78.0–100.0)
Platelets: 99 10*3/uL — ABNORMAL LOW (ref 150–400)
RBC: 5.41 MIL/uL (ref 4.22–5.81)
RDW: 15.6 % — ABNORMAL HIGH (ref 11.5–15.5)
WBC: 4.2 10*3/uL (ref 4.0–10.5)

## 2013-04-05 LAB — GLUCOSE, CAPILLARY
GLUCOSE-CAPILLARY: 117 mg/dL — AB (ref 70–99)
GLUCOSE-CAPILLARY: 166 mg/dL — AB (ref 70–99)

## 2013-04-05 LAB — PROTIME-INR
INR: 1.21 (ref 0.00–1.49)
Prothrombin Time: 15 seconds (ref 11.6–15.2)

## 2013-04-05 NOTE — Discharge Summary (Signed)
CARDIOLOGY DISCHARGE SUMMARY   Patient ID: Kenneth Giles MRN: [id number] DOB/AGE: Jul 13, 1950 63 y.o.  Admit date: 04/04/2013 Discharge date: 04/05/2013  PCP: [name], MD Primary Cardiologist: Dr. Tamala Julian  Primary Discharge Diagnosis:   ACS (acute coronary syndrome)  Secondary Discharge Diagnosis:  Thrombus cytopenia Fever  Procedures: Cardiac catheterization, coronary arteriogram, LIMA arteriogram, SVG angiogram, left ventriculogram, 2-D echocardiogram  Hospital Course: Kenneth Giles is a 63 y.o. male with a history of CAD. He had CABG in July 2014.  He developed chest pain the night before admission. It worsened with exertion and was improved by sublingual nitroglycerin glycerin. His initial troponin was negative and he has a known left bundle branch block. He was admitted for further evaluation and treatment.  His symptoms were concerning for progression of coronary artery disease so cardiac catheterization was recommended. He was taken to the cath lab on 2/20. Results are below but he has no new obstructive lesions and medical therapy is recommended for CAD.  He was noted to have a loud murmur at the right upper sternal border and an echocardiogram was ordered. Results are below. Aortic sclerosis but not stenosis was seen in this can be followed as an outpatient.  On 2/21, Kenneth Giles was seen by Dr. Stanford Breed. All data were reviewed. His symptoms had improved and he was ambulating without chest pain or shortness of breath. His EF had historically been 30-35%. It was 40% by heart catheterization but was 25-30% by echocardiogram.   He was noted to have had a fever up to 101.7 after the cath yesterday. His fever resolved with acetaminophen. His white count was within normal limits and a UA was negative. A chest x-ray did not show any acute process. Blood cultures are pending but are negative so far. His platelet count is low but at baseline.  Dr. Stanford Breed did  not believe any further inpatient workup was indicated so Kenneth Giles is considered stable for discharge, to follow up as an outpatient. If his blood cultures become positive, he will be contacted, but we will defer antibiotic therapy for now.   Labs:  Lab Results  Component Value Date   WBC 4.2 04/05/2013   HGB 15.3 04/05/2013   HCT 45.3 04/05/2013   MCV 83.7 04/05/2013   PLT 99* 04/05/2013     Recent Labs Lab 04/05/13 0320  NA 141  K 3.6*  CL 103  CO2 25  BUN 16  CREATININE 1.11  CALCIUM 8.6  GLUCOSE 110*    Recent Labs  04/04/13 0221 04/04/13 0655 04/04/13 1230  TROPONINI <0.30 <0.30 <0.30   Lipid Panel     Component Value Date/Time   CHOL 106 04/05/2013 0320   TRIG 136 04/05/2013 0320   HDL 22* 04/05/2013 0320   CHOLHDL 4.8 04/05/2013 0320   VLDL 27 04/05/2013 0320   LDLCALC 57 04/05/2013 0320    Recent Labs  04/05/13 0320  INR 1.21      Radiology: Dg Chest 2 View 04/05/2013   CLINICAL DATA:  Prior coronary bypass, shortness of breath, history of CHF  EXAM: CHEST  2 VIEW  COMPARISON:  04/04/2013  FINDINGS: Coronary bypass changes noted. Stable cardiomegaly without CHF or focal pneumonia. No effusion or pneumothorax. Trachea is midline. Degenerative changes of the spine.  IMPRESSION: Cardiomegaly and postop findings. No superimposed acute process   Electronically Signed   By: Daryll Brod M.D.   On: 04/05/2013 12:03   Dg Chest Port 1 View 04/04/2013  CLINICAL DATA:  Left-sided chest pain, slight fever.  Hypertension.  EXAM: PORTABLE CHEST - 1 VIEW  COMPARISON:  DG CHEST 2 VIEW dated 11/27/2012  FINDINGS: Cardiac silhouette appears moderately enlarged, status post median sternotomy for apparent coronary artery bypass grafting. Chronic interstitial changes (decreased interstitial prominence from prior examination). Blunting of the left costophrenic angle now seen . No pneumothorax.  Multiple EKG lines overlie the patient and may obscure subtle underlying pathology. Soft  tissue planes and included osseous structures are nonsuspicious. Mild degenerative change of thoracic spine.  IMPRESSION: Stable cardiomegaly, with blunting of the left costophrenic angle, which could reflect epicardial fat pad or small pleural effusion. Consider follow-up PA and lateral views the chest when clinically able.   Electronically Signed   By: Awilda Metro   On: 04/04/2013 01:55    Cardiac Cath: 04/04/2013 ANGIOGRAPHY:  1. Left main: Angiographically normal and bifurcated into LAD and left circumflex coronary artery  2. LAD: Moderate size vessel that had 80% proximal stenosis in the region of the first diagonal takeoff. The facet phenomena was seen after the second septal perforating artery.  3. Left circumflex: Diffuse 70% mid stenosis with an occluded marginal.  4. Right coronary artery: Diffuse 70-80% mid RCA stenoses  5.LIMA - LAD: Patent graft supplying the mid LAD  6. SVG - Diagonal: Patent  7. SVG - OM: Patent  8. Radial graft - RCA: Patent  Left ventriculography real revealed an ejection fraction of approximately 40%. There was hypokinesis involving the inferior wall and distal anterior wall  IMPRESSION:  Three vessel native coronary artery disease with 80% proximal LAD stenosis; diffuse 70% circumflex stenosis with an occluded marginal vessel; and diffuse 70-80% mid RCA stenoses  Patent LIMA graft supplying the mid LAD  Patent saphenous vein graft supplying the diagonal vessel  Patent saphenous vein graft supplying the circumflex marginal vessel  Patent radial graft supplying the distal right coronary artery  Moderately reduced LV function with an ejection fraction of approximately 40% hypocontractility involving the inferior wall and distal anterolateral wall  EKG: Sinus rhythm, left bundle branch block is old  Echo: 04/05/2013 Study Conclusions - Left ventricle: The apex is not well visualized but appears akinetic with increased density that is suspicious for LV  thrombus The cavity size was mildly dilated. Systolic function was severely reduced. The estimated ejection fraction was in the range of 25% to 30%. Diffuse hypokinesis. There is akinesis of the entireanteroseptal myocardium. Probable akinesis of the entireapical myocardium. Features are consistent with a pseudonormal left ventricular filling pattern, with concomitant abnormal relaxation and increased filling pressure (grade 2 diastolic dysfunction). - Ventricular septum: Septal motion showed paradox. - Aortic valve: Moderate thickening and calcification, consistent with sclerosis. Mild regurgitation. - Right ventricle: The cavity size was mildly dilated. Wall thickness was normal. - Tricuspid valve: Moderate regurgitation. - Pulmonary arteries: PA peak pressure: 48mm Hg (S). Impressions: - The right ventricular systolic pressure was increased consistent with mild pulmonary hypertension.    FOLLOW UP PLANS AND APPOINTMENTS Allergies  Allergen Reactions  . Tylenol With Codeine #3 [Acetaminophen-Codeine] Nausea Only     Medication List         aspirin EC 325 MG tablet  Take 325 mg by mouth daily.     B-Complex Tabs  Take 1 tablet by mouth daily.     CITRUCEL PO  Take 500 mg by mouth daily.     ESTER-C PO  Take 500 mg by mouth daily.     fenofibrate micronized  200 MG capsule  Commonly known as:  LOFIBRA  Take 200 mg by mouth daily before breakfast.     glipiZIDE 2.5 MG 24 hr tablet  Commonly known as:  GLUCOTROL XL  Take 5 mg by mouth daily.     GLUCOSAMINE-CHONDROITIN PLUS Tabs  Take 1 tablet by mouth daily.     LUTEIN 20 Caps  Take 6 mg by mouth every evening.     ibuprofen 200 MG tablet  Commonly known as:  ADVIL,MOTRIN  Take 200 mg by mouth 2 (two) times daily.     indapamide 1.25 MG tablet  Commonly known as:  LOZOL  Take 1.25 mg by mouth every morning.     lisinopril 20 MG tablet  Commonly known as:  PRINIVIL,ZESTRIL  Take 1 tablet (20 mg total)  by mouth daily.     metoprolol 100 MG tablet  Commonly known as:  LOPRESSOR  Take 1 tablet (100 mg total) by mouth 2 (two) times daily.     multivitamin tablet  Take 1 tablet by mouth daily.     nitroGLYCERIN 0.4 MG SL tablet  Commonly known as:  NITROSTAT  Place 0.4 mg under the tongue every 5 (five) minutes as needed for chest pain.     Omega 3 1000 MG Caps  Take 1 capsule by mouth daily.     omeprazole 20 MG capsule  Commonly known as:  PRILOSEC  Take 20 mg by mouth daily.     potassium chloride SA 20 MEQ tablet  Commonly known as:  K-DUR,KLOR-CON  Take 20 mEq by mouth daily.     Zinc 25 MG Tabs  Take 25 mg by mouth every evening.         Future Appointments Provider Department Dept Phone   04/14/2013 10:30 AM Rogelia Mire, NP [place] [facility] [phone]   07/04/2013 10:30 AM [facility] Echo [facility] [address] [phone]     Follow-up Information   Follow up with [name], MD. (as scheduled.)    Specialty:  Internal Medicine   Contact information:   [address] [place] [address] [phone]       Follow up with Murray Hodgkins, NP On 04/14/2013. (10:30 AM - Dr. Thompson Caul Nurse Practitioner)    Specialty:  Nurse Practitioner   Contact information:   [address] [address] [phone]       BRING ALL MEDICATIONS WITH YOU TO FOLLOW UP APPOINTMENTS  Time spent with patient to include physician time: 41 min Signed: Rosaria Ferries, PA-C 04/05/2013, 5:21 PM Co-Sign MD

## 2013-04-05 NOTE — Progress Notes (Signed)
Subjective: Kenneth Giles is a 63 yo pmh CAD s/p 4V CABG, systolic HF (ef 65-78%), HTN, HLD, DM p/w chest pain.   Patient denies dyspnea or chest pain. No productive cough, dysuria or diarrhea. Objective: Vital signs in last 24 hours: Filed Vitals:   04/04/13 1953 04/04/13 2358 04/05/13 0356 04/05/13 0725  BP: 131/49 132/56 159/81 151/69  Pulse: 73 68 79   Temp: 99.4 F (37.4 C) 99.2 F (37.3 C) 98.4 F (36.9 C) 99.1 F (37.3 C)  TempSrc: Oral Oral Oral Oral  Resp: 25 16 21 20   Height:      Weight:      SpO2: 96% 96% 94% 96%   Weight change:   Intake/Output Summary (Last 24 hours) at 04/05/13 0953 Last data filed at 04/05/13 0900  Gross per 24 hour  Intake 3230.52 ml  Output    900 ml  Net 2330.52 ml   General: NAD HEENT: normal Neck: supple Cardiac: RRR, 2/6 systolic murmur Pulm: CTA Abd: soft, nontender, nondistended, BS present Ext: Right groin with no hematoma and no bruit; no pedal edema Neuro: alert and oriented X3, cranial nerves II-XII grossly intact  Lab Results: Basic Metabolic Panel:  Recent Labs Lab 04/04/13 0221 04/05/13 0320  NA 145 141  K 3.5* 3.6*  CL 105 103  CO2 22 25  GLUCOSE 154* 110*  BUN 16 16  CREATININE 0.93 1.11  CALCIUM 9.0 8.6  MG 1.6  --    CBC:  Recent Labs Lab 04/04/13 0221  WBC 4.7  HGB 17.1*  HCT 48.5  MCV 82.5  PLT 103*   Cardiac Enzymes:  Recent Labs Lab 04/04/13 0221 04/04/13 0655 04/04/13 1230  TROPONINI <0.30 <0.30 <0.30    Cardiac Studies:  LHC 08/2012: severe 3 vessel coronary artery disease, including 90% proximal RCA stenosis, 80% to 90% proximal LAD stenosis, 80% stenosis of the ramus intermediate, and EF 30%.  Myoview 07/2012: intermediate to high risk study with anterior and inferior infarct/ischemia and decreased EF of 30-35%  Studies/Results: Dg Chest Port 1 View  04/04/2013   CLINICAL DATA:  Left-sided chest pain, slight fever.  Hypertension.  EXAM: PORTABLE CHEST - 1 VIEW  COMPARISON:  DG  CHEST 2 VIEW dated 11/27/2012  FINDINGS: Cardiac silhouette appears moderately enlarged, status post median sternotomy for apparent coronary artery bypass grafting. Chronic interstitial changes (decreased interstitial prominence from prior examination). Blunting of the left costophrenic angle now seen . No pneumothorax.  Multiple EKG lines overlie the patient and may obscure subtle underlying pathology. Soft tissue planes and included osseous structures are nonsuspicious. Mild degenerative change of thoracic spine.  IMPRESSION: Stable cardiomegaly, with blunting of the left costophrenic angle, which could reflect epicardial fat pad or small pleural effusion. Consider follow-up PA and lateral views the chest when clinically able.   Electronically Signed   By: Elon Alas   On: 04/04/2013 01:55   Medications: I have reviewed the patient's current medications. Scheduled Meds: . aspirin EC  81 mg Oral Daily  . atorvastatin  80 mg Oral q1800  . fenofibrate  160 mg Oral Daily  . insulin aspart  0-9 Units Subcutaneous TID WC  . lisinopril  20 mg Oral Daily  . metoprolol  100 mg Oral BID  . omega-3 acid ethyl esters  1 g Oral Daily  . pantoprazole  40 mg Oral Daily   Continuous Infusions: . sodium chloride 100 mL/hr at 04/04/13 2123  . nitroGLYCERIN Stopped (04/04/13 2000)   PRN Meds:.acetaminophen, ALPRAZolam, morphine  injection, nitroGLYCERIN, ondansetron (ZOFRAN) IV, zolpidem Assessment/Plan: #Chest pain-Symptoms resolved. Enzymes negative. Catheterization shows patent grafts. No plans for further evaluation. #Fever-patient had fever yesterday following catheterization but none since. Urinalysis negative. Question abnormality on initial chest x-ray. Will repeat PA and lateral. Repeat CBC. No other localizing signs or symptoms of infection. Followup blood cultures. #murmur-Await echo #CAD-Continue ASA and statin #ICM-continue ACEI and beta blocker If echo, CBC and chest xray ok, DC today and  fu with Dr Tamala Julian as outpt in 2- 4 weeks. Will need fu of blood cultures following DC. >30 min PA and physician time D2    LOS: 1 day    Aaron Edelman [facility] 04/05/2013 9:53 AM

## 2013-04-05 NOTE — Progress Notes (Signed)
Echocardiogram 2D Echocardiogram has been performed.  Joelene Millin 04/05/2013, 8:01 AM

## 2013-04-06 NOTE — Discharge Summary (Signed)
See progress notes Kenneth Giles  

## 2013-04-07 ENCOUNTER — Telehealth: Payer: Self-pay | Admitting: Interventional Cardiology

## 2013-04-07 NOTE — Telephone Encounter (Signed)
New Message  Pt called requests a call back to discuss if it is Ok to drive and go back to work. If it is ok for him to return what are the limitations.. Please call back to discuss.

## 2013-04-07 NOTE — Telephone Encounter (Signed)
Returned returned pt call.adv pt that Dr.Smit is not in the office. I would need to send Dr.Smith a message on pt limitations, and when/if pt can return to work. I will call pt back once I hear back from Dr.Smith.pt verbalized understanding.

## 2013-04-09 NOTE — Telephone Encounter (Signed)
Okay to return to work

## 2013-04-11 LAB — CULTURE, BLOOD (ROUTINE X 2)
Culture: NO GROWTH
Culture: NO GROWTH

## 2013-04-11 NOTE — Telephone Encounter (Signed)
[email or web address] per Dr.[email or web address].Ok for pt to return to work

## 2013-04-14 ENCOUNTER — Encounter: Payer: Self-pay | Admitting: Nurse Practitioner

## 2013-04-14 ENCOUNTER — Ambulatory Visit (INDEPENDENT_AMBULATORY_CARE_PROVIDER_SITE_OTHER): Payer: BC Managed Care – PPO | Admitting: Nurse Practitioner

## 2013-04-14 VITALS — BP 165/77 | HR 66 | Ht 64.0 in | Wt 236.0 lb

## 2013-04-14 DIAGNOSIS — I10 Essential (primary) hypertension: Secondary | ICD-10-CM

## 2013-04-14 DIAGNOSIS — E785 Hyperlipidemia, unspecified: Secondary | ICD-10-CM

## 2013-04-14 DIAGNOSIS — I25810 Atherosclerosis of coronary artery bypass graft(s) without angina pectoris: Secondary | ICD-10-CM

## 2013-04-14 DIAGNOSIS — I5022 Chronic systolic (congestive) heart failure: Secondary | ICD-10-CM

## 2013-04-14 DIAGNOSIS — I255 Ischemic cardiomyopathy: Secondary | ICD-10-CM

## 2013-04-14 DIAGNOSIS — I2589 Other forms of chronic ischemic heart disease: Secondary | ICD-10-CM

## 2013-04-14 MED ORDER — LISINOPRIL 40 MG PO TABS: 40.0000 mg | ORAL_TABLET | Freq: Every day | ORAL | Status: DC
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
## 2013-04-14 NOTE — Patient Instructions (Signed)
Your physician has recommended you make the following change in your medication:  1. Increase Lisinopril to 40 mg daily    Your physician recommends that you schedule a follow-up appointment in: 3 months with Dr. Tamala Julian

## 2013-06-12 ENCOUNTER — Other Ambulatory Visit: Payer: Self-pay | Admitting: Nurse Practitioner

## 2013-07-04 ENCOUNTER — Other Ambulatory Visit (HOSPITAL_COMMUNITY): Payer: BC Managed Care – PPO

## 2013-07-21 ENCOUNTER — Encounter: Payer: Self-pay | Admitting: Interventional Cardiology

## 2013-07-21 ENCOUNTER — Ambulatory Visit (INDEPENDENT_AMBULATORY_CARE_PROVIDER_SITE_OTHER): Payer: BC Managed Care – PPO | Admitting: Interventional Cardiology

## 2013-07-21 VITALS — BP 150/78 | HR 70 | Ht 64.0 in | Wt 235.0 lb

## 2013-07-21 DIAGNOSIS — I10 Essential (primary) hypertension: Secondary | ICD-10-CM

## 2013-07-21 DIAGNOSIS — Z951 Presence of aortocoronary bypass graft: Secondary | ICD-10-CM

## 2013-07-21 DIAGNOSIS — I5022 Chronic systolic (congestive) heart failure: Secondary | ICD-10-CM

## 2013-07-21 DIAGNOSIS — E119 Type 2 diabetes mellitus without complications: Secondary | ICD-10-CM

## 2013-07-21 MED ORDER — AMLODIPINE BESYLATE 5 MG PO TABS
5.0000 mg | ORAL_TABLET | Freq: Every day | ORAL | Status: DC
Start: 2013-07-21 — End: 2014-01-15

## 2013-07-21 NOTE — Patient Instructions (Signed)
Your physician has recommended you make the following change in your medication:  1) START Amlodipine 5mg  daily. An Rx has been sent to your pharmacy  Your physician recommends that you schedule a follow-up appointment in: 6 weeks with the NP/or PA  Your physician wants you to follow-up in: 1 year with Dr.Smith You will receive a reminder letter in the mail two months in advance. If you don't receive a letter, please call our office to schedule the follow-up appointment.

## 2013-07-21 NOTE — Progress Notes (Signed)
Patient ID: TEDRICK PORT, male   DOB: [birth year], 63 y.o.   MRN: [id number]    [address], [place]  [address] Phone: [phone] Fax:  [phone]  Date:  07/21/2013   ID:  KOUPER SPINELLA, DOB October 09, 1950, MRN [id number]  PCP:  [name], MD   ASSESSMENT:  1. Systolic heart failure, chronic, with ejection fraction of 40% by catheterization in February 2. Coronary artery disease, stable, with patent grafts in February 3. Hypertension, poorly controlled  PLAN:  1. Start amlodipine 5 mg per day 2. Followup with extended 6 weeks and me in one year. The reason for the extended visit his blood pressure followup   SUBJECTIVE: NAHIEM DREDGE is a 63 y.o. male who has no complaints. He is back at work. Chills and no problems. Underwent coronary angiography in February when he presented with chest pain or shortness of breath. All grafts were patent. EF was 35-40%.   Wt Readings from Last 3 Encounters:  07/21/13 235 lb (106.595 kg)  04/14/13 236 lb (107.049 kg)  04/04/13 235 lb 14.3 oz (107 kg)     Past Medical History  Diagnosis Date  . Hypercholesteremia     takes Lipitor daily  . Allergic rhinitis     takes Zyrtec daily  . Chronic low back pain     cause unknown  . Left bundle branch block   . Thrombocytopenia   . Nephrolithiasis   . H/O renal calculi     no treatment needed  . Lipoma of back   . CAD (coronary artery disease)     a. 2014 CABG x 4 (L->LAD, V->Diag, V->OM, Rad->RCA);  b. 03/2013 Cath: LM nl, LAD 80p, LCX 36m, OM1 100, RCA 70-33m, all grafts patent, EF 40%.  . Diabetes mellitus     takes Glipizide daily  . Constipation     Citrucel daily  . GERD (gastroesophageal reflux disease)     takes Omeprazole daily  . Hypertension     takes Metoprolol daily and Lisinopril   . Pneumonia     hx of about 10+yrs ago  . Headache(784.0)     rarely   . Peripheral neuropathy   . Peripheral edema     takes Lozol daily  .  Arthritis     hands   . History of colon polyps   . Diverticulosis   . Urinary frequency   . Enlarged prostate     slightly  . Nocturia   . History of blood transfusion     no abnormal reaction noted  . Ischemic cardiomyopathy     a. 03/2013 Echo: EF 25-30%, diff HK, antsep/apical AK, Gr2 DD, Ao sclerosis, mild AI, PASP 36.  . Chronic systolic CHF (congestive heart failure)   . Aortic sclerosis     a. syst murmur bilat usb->sclerosis by echo 03/2013.    Current Outpatient Prescriptions  Medication Sig Dispense Refill  . aspirin EC 325 MG tablet Take 325 mg by mouth daily.      [name] atorvastatin (LIPITOR) 40 MG tablet       . B-Complex TABS Take 1 tablet by mouth daily.      . fenofibrate micronized (LOFIBRA) 200 MG capsule Take 200 mg by mouth daily before breakfast.      . glipiZIDE (GLUCOTROL XL) 2.5 MG 24 hr tablet Take 5 mg by mouth daily.       [name] ibuprofen (ADVIL,MOTRIN) 200 MG tablet Take 200 mg by  mouth 2 (two) times daily.      . indapamide (LOZOL) 1.25 MG tablet Take 1.25 mg by mouth every morning.      [name] lisinopril (PRINIVIL,ZESTRIL) 40 MG tablet TAKE 1 TABLET (40 MG TOTAL) BY MOUTH DAILY.  30 tablet  1  . Methylcellulose, Laxative, (CITRUCEL PO) Take 500 mg by mouth daily.      . metoprolol (LOPRESSOR) 100 MG tablet Take 1 tablet (100 mg total) by mouth 2 (two) times daily.  60 tablet  5  . Misc Natural Products (GLUCOSAMINE-CHONDROITIN PLUS) TABS Take 1 tablet by mouth daily.      . Misc Natural Products (LUTEIN 20) CAPS Take 6 mg by mouth every evening.       . Multiple Vitamin (MULTIVITAMIN) tablet Take 1 tablet by mouth daily.      . nitroGLYCERIN (NITROSTAT) 0.4 MG SL tablet Place 0.4 mg under the tongue every 5 (five) minutes as needed for chest pain.      [name] OMEGA 3 1000 MG CAPS Take 1 capsule by mouth daily.      [name] omeprazole (PRILOSEC) 20 MG capsule Take 20 mg by mouth daily.      . ONE TOUCH ULTRA TEST test strip       . potassium chloride SA (K-DUR,KLOR-CON) 20 MEQ  tablet Take 20 mEq by mouth daily.      . Vitamin Mixture (ESTER-C PO) Take 500 mg by mouth daily.      . Zinc 25 MG TABS Take 25 mg by mouth every evening.       No current facility-administered medications for this visit.    Allergies:    Allergies  Allergen Reactions  . Tylenol With Codeine #3 [Acetaminophen-Codeine] Nausea Only    Social History:  The patient  reports that he has never smoked. He has never used smokeless tobacco. He reports that he drinks alcohol. He reports that he does not use illicit drugs.   ROS:  Please see the history of present illness.   Obese. Concern about his recent hospitalization and the subsequent Bil.   All other systems reviewed and negative.   OBJECTIVE: VS:  BP 150/78  Pulse 70  Ht 5\' 4"  (1.626 m)  Wt 235 lb (106.595 kg)  BMI 40.32 kg/m2 Well nourished, well developed, in no acute distress, obese HEENT: normal Neck: JVD flat. Carotid bruit absent  Cardiac:  normal S1, S2; RRR; no murmur Lungs:  clear to auscultation bilaterally, no wheezing, rhonchi or rales Abd: soft, nontender, no hepatomegaly Ext: Edema  Absent . Pulses 2+ and symmetric in the upper and lower extremities  Skin: warm and dry Neuro:  CNs 2-12 intact, no focal abnormalities noted  EKG:  Not repeated       Signed, Illene Labrador III, MD 07/21/2013 4:50 PM

## 2013-07-31 ENCOUNTER — Other Ambulatory Visit: Payer: Self-pay

## 2013-07-31 MED ORDER — METOPROLOL TARTRATE 100 MG PO TABS: 100.0000 mg | ORAL_TABLET | Freq: Two times a day (BID) | ORAL | Status: DC

## 2013-08-04 ENCOUNTER — Telehealth: Payer: Self-pay | Admitting: *Deleted

## 2013-08-04 NOTE — Telephone Encounter (Signed)
lmovm to r/s pt's ov due to Lori's schedule

## 2013-08-12 ENCOUNTER — Other Ambulatory Visit: Payer: Self-pay | Admitting: Interventional Cardiology

## 2013-09-01 ENCOUNTER — Encounter: Payer: Self-pay | Admitting: Physician Assistant

## 2013-09-01 ENCOUNTER — Telehealth: Payer: Self-pay | Admitting: Physician Assistant

## 2013-09-01 ENCOUNTER — Ambulatory Visit: Payer: BC Managed Care – PPO | Admitting: Nurse Practitioner

## 2013-09-01 ENCOUNTER — Telehealth: Payer: Self-pay | Admitting: *Deleted

## 2013-09-01 ENCOUNTER — Ambulatory Visit (INDEPENDENT_AMBULATORY_CARE_PROVIDER_SITE_OTHER): Payer: BC Managed Care – PPO | Admitting: Physician Assistant

## 2013-09-01 VITALS — BP 122/78 | HR 71 | Ht 64.0 in | Wt 231.0 lb

## 2013-09-01 DIAGNOSIS — I2510 Atherosclerotic heart disease of native coronary artery without angina pectoris: Secondary | ICD-10-CM

## 2013-09-01 DIAGNOSIS — I255 Ischemic cardiomyopathy: Secondary | ICD-10-CM

## 2013-09-01 DIAGNOSIS — E785 Hyperlipidemia, unspecified: Secondary | ICD-10-CM

## 2013-09-01 DIAGNOSIS — I2589 Other forms of chronic ischemic heart disease: Secondary | ICD-10-CM

## 2013-09-01 DIAGNOSIS — I10 Essential (primary) hypertension: Secondary | ICD-10-CM

## 2013-09-01 DIAGNOSIS — I5022 Chronic systolic (congestive) heart failure: Secondary | ICD-10-CM

## 2013-09-01 NOTE — Telephone Encounter (Signed)
Please tell Kenneth Giles [facility] that I reviewed his prior echo with Dr. [name] today. Now that his BP is better, we want him to get a follow up echo to see if it has recovered more.  Please arrange 2D echo with contrast. I have put the order in already. Richardson Dopp, PA-C   09/01/2013 5:42 PM

## 2013-09-01 NOTE — Patient Instructions (Signed)
Your physician recommends that you continue on your current medications as directed. Please refer to the Current Medication list given to you today.  KEEP APPT WITH DR Tamala Julian

## 2013-09-01 NOTE — Telephone Encounter (Signed)
pt aware needs repeat echo per Dr. Tamala Julian and Brynda Rim. PA. Pt scheduled for 09/11/13 4 pm.

## 2013-09-01 NOTE — Progress Notes (Signed)
Cardiology Office Note    Date:  09/01/2013   ID:  Kenneth Giles, Kenneth Giles October 21, 1950, MRN [id number]  PCP:  [name], MD  Cardiologist:  Dr. [name]      History of Present Illness: Kenneth Giles is a 63 y.o. male with a hx of CAD, s/p CABG 08/2012, ischemic CM, HTN, HL, diabetes, LBBB.   Echo in 03/2013 demonstrated EF 25-30%.  LHC in 03/2013 demonstrated patent grafts and EF 40%.  Last seen by Dr. [name] 07/21/13.  Amlodipine was added for elevated BP.  He returns for follow up.  Doing well since last seen.  Taking all medications without SEs.  He denies chest pain or significant dyspnea.  Denies orthopnea, PND, edema.  No syncope.  No cough or wheezing.  No bleeding problems.  NYHA 2-2b.     Studies:  - LHC (03/2013):  Proximal LAD 80%, mid circumflex 70%, obtuse marginal occluded, mid RCA 70-80%, LIMA-LAD patent, SVG-diagonal patent, SVG-OM patent, and radial-RCA patent, EF 40%  - Echo (03/2013):  Diffuse hypokinesis, anteroseptal akinesis, Apex appears akinetic, questionable LV thrombus, EF 66-59%, grade 2 diastolic dysfunction, aortic sclerosis, mild AI, mild RVE, moderate TR, PASP 36 mm Hg  - Carotid US (08/2012):  Bilateral ICA <39%   Recent Labs: 04/05/2013: Creatinine 1.11; HDL Cholesterol by NMR 22*; Hemoglobin 15.3; LDL (calc) 57; Potassium 3.6*   Wt Readings from Last 3 Encounters:  09/01/13 231 lb (104.781 kg)  07/21/13 235 lb (106.595 kg)  04/14/13 236 lb (107.049 kg)     Past Medical History  Diagnosis Date  . Hypercholesteremia     takes Lipitor daily  . Allergic rhinitis     takes Zyrtec daily  . Chronic low back pain     cause unknown  . Left bundle branch block   . Thrombocytopenia   . Nephrolithiasis   . H/O renal calculi     no treatment needed  . Lipoma of back   . CAD (coronary artery disease)     a. 2014 CABG x 4 (L->LAD, V->Diag, V->OM, Rad->RCA);  b. 03/2013 Cath: LM nl, LAD 80p, LCX 49m, OM1 100, RCA 70-95m, all grafts patent,  EF 40%.  . Diabetes mellitus     takes Glipizide daily  . Constipation     Citrucel daily  . GERD (gastroesophageal reflux disease)     takes Omeprazole daily  . Hypertension     takes Metoprolol daily and Lisinopril   . Pneumonia     hx of about 10+yrs ago  . Headache(784.0)     rarely   . Peripheral neuropathy   . Peripheral edema     takes Lozol daily  . Arthritis     hands   . History of colon polyps   . Diverticulosis   . Urinary frequency   . Enlarged prostate     slightly  . Nocturia   . History of blood transfusion     no abnormal reaction noted  . Ischemic cardiomyopathy     a. 03/2013 Echo: EF 25-30%, diff HK, antsep/apical AK, Gr2 DD, Ao sclerosis, mild AI, PASP 36.  . Chronic systolic CHF (congestive heart failure)   . Aortic sclerosis     a. syst murmur bilat usb->sclerosis by echo 03/2013.    Current Outpatient Prescriptions  Medication Sig Dispense Refill  . amLODipine (NORVASC) 5 MG tablet Take 1 tablet (5 mg total) by mouth daily.  30 tablet  11  . aspirin EC 325  MG tablet Take 325 mg by mouth daily.      [name] atorvastatin (LIPITOR) 40 MG tablet       . B-Complex TABS Take 1 tablet by mouth daily.      . fenofibrate micronized (LOFIBRA) 200 MG capsule Take 200 mg by mouth daily before breakfast.      . glipiZIDE (GLUCOTROL XL) 2.5 MG 24 hr tablet Take 5 mg by mouth daily.       [name] ibuprofen (ADVIL,MOTRIN) 200 MG tablet Take 200 mg by mouth 2 (two) times daily.      . indapamide (LOZOL) 1.25 MG tablet Take 1.25 mg by mouth every morning.      [name] lisinopril (PRINIVIL,ZESTRIL) 40 MG tablet TAKE 1 TABLET (40 MG TOTAL) BY MOUTH DAILY.  30 tablet  1  . Methylcellulose, Laxative, (CITRUCEL PO) Take 500 mg by mouth daily.      . metoprolol (LOPRESSOR) 100 MG tablet Take 1 tablet (100 mg total) by mouth 2 (two) times daily.  60 tablet  5  . Misc Natural Products (GLUCOSAMINE-CHONDROITIN PLUS) TABS Take 1 tablet by mouth daily.      . Misc Natural Products (LUTEIN 20)  CAPS Take 6 mg by mouth every evening.       . Multiple Vitamin (MULTIVITAMIN) tablet Take 1 tablet by mouth daily.      . nitroGLYCERIN (NITROSTAT) 0.4 MG SL tablet Place 0.4 mg under the tongue every 5 (five) minutes as needed for chest pain.      [name] OMEGA 3 1000 MG CAPS Take 1 capsule by mouth daily.      [name] omeprazole (PRILOSEC) 20 MG capsule Take 20 mg by mouth daily.      . ONE TOUCH ULTRA TEST test strip       . potassium chloride SA (K-DUR,KLOR-CON) 20 MEQ tablet Take 20 mEq by mouth 2 (two) times daily.       . Vitamin Mixture (ESTER-C PO) Take 500 mg by mouth daily.      . Zinc 25 MG TABS Take 25 mg by mouth every evening.       No current facility-administered medications for this visit.    Allergies:   Tylenol with codeine #3   Social History:  The patient  reports that he has never smoked. He has never used smokeless tobacco. He reports that he drinks alcohol. He reports that he does not use illicit drugs.   Family History:  The patient's family history includes Cancer in his mother; Fibromyalgia in his sister; Hyperlipidemia in his mother; Hypertension in his mother.   ROS:  Please see the history of present illness.      All other systems reviewed and negative.   PHYSICAL EXAM: VS:  BP 122/78  Pulse 71  Ht 5\' 4"  (1.626 m)  Wt 231 lb (104.781 kg)  BMI 39.63 kg/m2 Well nourished, well developed, in no acute distress HEENT: normal Neck: no JVD Cardiac:  normal S1, S2; RRR; 1/6 systolic murmurLSB Lungs:  clear to auscultation bilaterally, no wheezing, rhonchi or rales Abd: soft, nontender, no hepatomegaly Ext: trace bilateral LE edema Skin: warm and dry Neuro:  CNs 2-12 intact, no focal abnormalities noted  EKG:  NSR, HR 71, LBBB      ASSESSMENT AND PLAN:  1. Essential hypertension - BP now optimal.  No change in Rx.   2. Chronic systolic heart failure - He is NYHA 2-2b.  No evidence of volume excess.  Continue current Rx.  3.  Ischemic cardiomyopathy - Continue  current dose of ACEI, beta blocker.  Could consider adding Spironolactone if BP remains elevated.  EF by recent LHC 40%.  Reviewed echo from 03/2013 with Dr. [name] after the patient left the office.  There was a question of an apical thrombus.  Will arrange repeat echo now with contrast to assess for EF and rule out thrombus. 4. Coronary artery disease involving native coronary artery of native heart without angina pectoris - No angina.  Continue ASA, statin, beta blocker.  5. HLD (hyperlipidemia) - Continue statin.  Recent LDL ok.  6. Disposition - F/u with Dr. [name] next year as planned.    Signed, Versie Starks, MHS 09/01/2013 10:18 AM    [facility] [place], [place], [place]  [address] Phone: [phone]; Fax: [phone]

## 2013-09-11 ENCOUNTER — Other Ambulatory Visit (HOSPITAL_COMMUNITY): Payer: BC Managed Care – PPO

## 2013-09-17 ENCOUNTER — Ambulatory Visit (HOSPITAL_COMMUNITY): Payer: BC Managed Care – PPO | Attending: Cardiovascular Disease | Admitting: Cardiology

## 2013-09-17 ENCOUNTER — Other Ambulatory Visit (HOSPITAL_COMMUNITY): Payer: BC Managed Care – PPO | Admitting: *Deleted

## 2013-09-17 DIAGNOSIS — I5022 Chronic systolic (congestive) heart failure: Secondary | ICD-10-CM

## 2013-09-17 DIAGNOSIS — R0600 Dyspnea, unspecified: Secondary | ICD-10-CM

## 2013-09-17 MED ORDER — PERFLUTREN PROTEIN A MICROSPH IV SUSP
1.0000 mL | Freq: Once | INTRAVENOUS | Status: AC
  Administered 2013-09-17: 1 mL via INTRAVENOUS

## 2013-09-17 NOTE — Progress Notes (Signed)
Echo performed. 

## 2013-10-03 ENCOUNTER — Telehealth: Payer: Self-pay

## 2013-10-03 NOTE — Telephone Encounter (Signed)
Pt given echo results.No blood clot noted in the heart. If LV function is truly this low, he needs to be considered for AICD. His blood pressure has not been under good control. Plan should be referral to the people consideration of AICD. Pt adv that a scheduler form our office will call him to sch a consult with one of the EP Docs. Pt agreeable and verbalized understanding.

## 2013-10-03 NOTE — Telephone Encounter (Signed)
called to give pt echo results and Dr.Smith recommendations.pt sts that he is busy at work and would need to callback. adv pt that I will be here until 5pm today.

## 2013-10-03 NOTE — Telephone Encounter (Signed)
Message copied by Lamar Laundry on Fri Oct 03, 2013  1:34 PM ------      Message from: [name]      Created: Thu Sep 18, 2013  1:30 PM       No blood clot noted in the heart. If LV function is truly this low, he needs to be considered for AICD. His blood pressure has not been under good control. Plan should be referral to the people consideration of AICD ------

## 2013-10-03 NOTE — Telephone Encounter (Signed)
error 

## 2013-10-14 ENCOUNTER — Institutional Professional Consult (permissible substitution): Payer: BC Managed Care – PPO | Admitting: Internal Medicine

## 2013-10-17 ENCOUNTER — Other Ambulatory Visit: Payer: Self-pay | Admitting: Interventional Cardiology

## 2013-11-25 ENCOUNTER — Ambulatory Visit (INDEPENDENT_AMBULATORY_CARE_PROVIDER_SITE_OTHER): Payer: BC Managed Care – PPO | Admitting: Internal Medicine

## 2013-11-25 ENCOUNTER — Encounter: Payer: Self-pay | Admitting: Internal Medicine

## 2013-11-25 VITALS — BP 152/68 | HR 64 | Ht 66.0 in | Wt 242.2 lb

## 2013-11-25 DIAGNOSIS — R931 Abnormal findings on diagnostic imaging of heart and coronary circulation: Secondary | ICD-10-CM

## 2013-11-25 DIAGNOSIS — R0989 Other specified symptoms and signs involving the circulatory and respiratory systems: Secondary | ICD-10-CM

## 2013-11-25 DIAGNOSIS — I255 Ischemic cardiomyopathy: Secondary | ICD-10-CM

## 2013-11-25 DIAGNOSIS — I5022 Chronic systolic (congestive) heart failure: Secondary | ICD-10-CM

## 2013-11-25 MED ORDER — SPIRONOLACTONE 25 MG PO TABS: 25.0000 mg | ORAL_TABLET | Freq: Every day | ORAL | Status: DC

## 2013-11-25 MED ORDER — ASPIRIN EC 81 MG PO TBEC: 81.0000 mg | DELAYED_RELEASE_TABLET | Freq: Every day | ORAL | Status: AC

## 2013-11-25 MED ORDER — TORSEMIDE 10 MG PO TABS: 10.0000 mg | ORAL_TABLET | Freq: Every day | ORAL | Status: DC
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
## 2013-11-25 NOTE — Patient Instructions (Addendum)
Your physician has recommended you make the following change in your medication:  1) DECREASE Aspirin to 81 mg daily 2) START Aldactone 25 mg daily 3) STOP Indapamide 4) START Torsemide 10 mg daily  Your physician has recommended that you have a home sleep study. Someone will call you to arrange this.  Your physician recommends that you schedule a follow-up appointment in: 3 weeks to see Dr. Tamala Julian or Richardson Dopp, [facility].  Your physician recommends that you return for lab work in: 1 week for BMET Your physician recommends that you return for lab work in: 3 weeks for BMET  Your physician has requested that you have a cardiac MRI. Cardiac MRI uses a computer to create images of your heart as its beating, producing both still and moving pictures of your heart and major blood vessels. For further information please visit [email or web address] Please follow the instruction sheet given to you today for more information.  Your physician recommends that you schedule a follow-up appointment in: 8 weeks with Dr. Caryl Comes   Thank you for choosing [facility]!!

## 2013-11-28 ENCOUNTER — Encounter: Payer: Self-pay | Admitting: Internal Medicine

## 2013-11-28 ENCOUNTER — Telehealth: Payer: Self-pay | Admitting: Physician Assistant

## 2013-11-28 NOTE — Telephone Encounter (Signed)
New Message     Pt calling to schedule appt for 8wk f/u, pt is requesting the 4pm slot on 01/15/14,I do not have override permissions in order to schedule this appt in an end slot. Please schedule appt and contact pt to confirm.

## 2013-12-01 ENCOUNTER — Encounter: Payer: Self-pay | Admitting: Internal Medicine

## 2013-12-01 ENCOUNTER — Ambulatory Visit (HOSPITAL_COMMUNITY): Admission: RE | Admit: 2013-12-01 | Payer: BC Managed Care – PPO | Source: Ambulatory Visit

## 2013-12-02 ENCOUNTER — Other Ambulatory Visit (INDEPENDENT_AMBULATORY_CARE_PROVIDER_SITE_OTHER): Payer: BC Managed Care – PPO | Admitting: *Deleted

## 2013-12-02 DIAGNOSIS — I10 Essential (primary) hypertension: Secondary | ICD-10-CM

## 2013-12-02 LAB — BASIC METABOLIC PANEL
BUN: 23 mg/dL (ref 6–23)
CHLORIDE: 107 meq/L (ref 96–112)
CO2: 25 mEq/L (ref 19–32)
Calcium: 9.2 mg/dL (ref 8.4–10.5)
Creatinine, Ser: 1.3 mg/dL (ref 0.4–1.5)
GFR: 61.22 mL/min (ref >60.00)
GLUCOSE: 212 mg/dL — AB (ref 70–99)
POTASSIUM: 4.1 meq/L (ref 3.5–5.1)
Sodium: 140 mEq/L (ref 135–145)

## 2013-12-04 ENCOUNTER — Telehealth: Payer: Self-pay | Admitting: Internal Medicine

## 2013-12-04 NOTE — Telephone Encounter (Signed)
[name] came in today 12/04/13 to meet with Lurena Joiner with [facility]. Somewhere there was some miscommunication, Mr. Rzepka thinking he was set up today at the [employer]. Please Call Patient thanks.

## 2013-12-10 NOTE — Telephone Encounter (Signed)
Spoke to patient he stated he rescheduled appointment with [facility] here at office for this Friday 12/12/13 at 4:00 pm.Message sent to Lorenda Hatchet EP scheduler to verify that appointment.

## 2013-12-15 ENCOUNTER — Ambulatory Visit: Payer: BC Managed Care – PPO | Admitting: Physician Assistant

## 2013-12-18 ENCOUNTER — Inpatient Hospital Stay (HOSPITAL_COMMUNITY): Admission: RE | Admit: 2013-12-18 | Payer: BC Managed Care – PPO | Source: Ambulatory Visit

## 2013-12-18 ENCOUNTER — Ambulatory Visit (HOSPITAL_COMMUNITY)
Admission: RE | Admit: 2013-12-18 | Discharge: 2013-12-18 | Disposition: A | Discharge status: Home / Self Care | Payer: BLUE CROSS/BLUE SHIELD | Source: Ambulatory Visit | Attending: Internal Medicine | Admitting: Internal Medicine

## 2013-12-18 DIAGNOSIS — I255 Ischemic cardiomyopathy: Secondary | ICD-10-CM

## 2013-12-18 DIAGNOSIS — I5022 Chronic systolic (congestive) heart failure: Secondary | ICD-10-CM

## 2013-12-18 MED ORDER — GADOBENATE DIMEGLUMINE 529 MG/ML IV SOLN
38.0000 mL | Freq: Once | INTRAVENOUS | Status: AC | PRN
  Administered 2013-12-18: 38 mL via INTRAVENOUS

## 2013-12-19 ENCOUNTER — Other Ambulatory Visit (INDEPENDENT_AMBULATORY_CARE_PROVIDER_SITE_OTHER): Payer: BC Managed Care – PPO | Admitting: *Deleted

## 2013-12-19 ENCOUNTER — Encounter: Payer: Self-pay | Admitting: Physician Assistant

## 2013-12-19 ENCOUNTER — Telehealth: Payer: Self-pay | Admitting: *Deleted

## 2013-12-19 ENCOUNTER — Ambulatory Visit (INDEPENDENT_AMBULATORY_CARE_PROVIDER_SITE_OTHER): Payer: BC Managed Care – PPO | Admitting: Physician Assistant

## 2013-12-19 VITALS — BP 141/70 | HR 64 | Ht 66.0 in | Wt 241.0 lb

## 2013-12-19 DIAGNOSIS — I10 Essential (primary) hypertension: Secondary | ICD-10-CM

## 2013-12-19 DIAGNOSIS — E118 Type 2 diabetes mellitus with unspecified complications: Secondary | ICD-10-CM

## 2013-12-19 DIAGNOSIS — I5022 Chronic systolic (congestive) heart failure: Secondary | ICD-10-CM

## 2013-12-19 DIAGNOSIS — E785 Hyperlipidemia, unspecified: Secondary | ICD-10-CM

## 2013-12-19 DIAGNOSIS — I2510 Atherosclerotic heart disease of native coronary artery without angina pectoris: Secondary | ICD-10-CM

## 2013-12-19 DIAGNOSIS — I255 Ischemic cardiomyopathy: Secondary | ICD-10-CM

## 2013-12-19 LAB — BASIC METABOLIC PANEL
BUN: 18 mg/dL (ref 6–23)
CO2: 22 meq/L (ref 19–32)
CREATININE: 1.2 mg/dL (ref 0.4–1.5)
Calcium: 9.2 mg/dL (ref 8.4–10.5)
Chloride: 107 mEq/L (ref 96–112)
GFR: 67.95 mL/min (ref >60.00)
Glucose, Bld: 199 mg/dL — ABNORMAL HIGH (ref 70–99)
POTASSIUM: 4.1 meq/L (ref 3.5–5.1)
Sodium: 138 mEq/L (ref 135–145)

## 2013-12-19 MED ORDER — CARVEDILOL 12.5 MG PO TABS: 12.5000 mg | ORAL_TABLET | Freq: Two times a day (BID) | ORAL | Status: DC

## 2013-12-19 NOTE — Telephone Encounter (Signed)
lmom that identified pt by name lab ok, continue current treament plan
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
## 2013-12-19 NOTE — Patient Instructions (Signed)
LAB WORK TODAY; BMET  Your physician has recommended you make the following change in your medication:  1. STOP METOPROLOL 2. START COREG 12.5 MG 1 TABLET TWICE DAILY; RX SENT IN TODAY  KEEP YOUR APPT WITH DR. Caryl Comes 01/15/14

## 2013-12-23 NOTE — Telephone Encounter (Signed)
Late Entry - Patient was called on 11/5 and informed appt cancelled.  Explained situation and that we may be switching home sleep study company. Informed him that we would contact him once decision has been made when Dr. Caryl Comes can review when he returns to office week of 11/18. Patient is agreeable to this.

## 2014-01-15 ENCOUNTER — Ambulatory Visit (INDEPENDENT_AMBULATORY_CARE_PROVIDER_SITE_OTHER): Payer: BC Managed Care – PPO | Admitting: Internal Medicine

## 2014-01-15 ENCOUNTER — Encounter: Payer: Self-pay | Admitting: Internal Medicine

## 2014-01-15 VITALS — BP 122/64 | HR 75 | Ht 66.0 in | Wt 242.0 lb

## 2014-01-15 DIAGNOSIS — I255 Ischemic cardiomyopathy: Secondary | ICD-10-CM

## 2014-01-15 DIAGNOSIS — I447 Left bundle-branch block, unspecified: Secondary | ICD-10-CM

## 2014-01-15 DIAGNOSIS — I5022 Chronic systolic (congestive) heart failure: Secondary | ICD-10-CM

## 2014-01-15 DIAGNOSIS — Z79899 Other long term (current) drug therapy: Secondary | ICD-10-CM

## 2014-01-15 MED ORDER — CARVEDILOL 25 MG PO TABS: 25.0000 mg | ORAL_TABLET | Freq: Two times a day (BID) | ORAL | Status: DC

## 2014-01-15 NOTE — Patient Instructions (Signed)
Your physician has recommended you make the following change in your medication:  1) STOP Amlodipine 2) INCREASE Carvedilol to 25 mg twice daily  Lab today: BMET  Your physician recommends that you schedule a follow-up appointment in: 8 weeks with Richardson Dopp, PA.  Your physician recommends that you return for lab work in: 8 weeks for BMET (same day you see Richardson Dopp)  Change recall for Dr. Tamala Julian for April 2016.
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
## 2014-01-18 LAB — BASIC METABOLIC PANEL
BUN: 25 mg/dL — AB (ref 6–23)
CHLORIDE: 107 meq/L (ref 96–112)
CO2: 25 meq/L (ref 19–32)
Calcium: 9.7 mg/dL (ref 8.4–10.5)
Creatinine, Ser: 1.3 mg/dL (ref 0.4–1.5)
GFR: 58.02 mL/min — ABNORMAL LOW (ref >60.00)
GLUCOSE: 132 mg/dL — AB (ref 70–99)
POTASSIUM: 4.3 meq/L (ref 3.5–5.1)
Sodium: 141 mEq/L (ref 135–145)

## 2014-01-22 ENCOUNTER — Encounter (HOSPITAL_COMMUNITY): Payer: Self-pay | Admitting: Interventional Cardiology

## 2014-01-30 ENCOUNTER — Other Ambulatory Visit: Payer: Self-pay

## 2014-01-30 ENCOUNTER — Other Ambulatory Visit: Payer: Self-pay | Admitting: *Deleted

## 2014-01-30 DIAGNOSIS — I255 Ischemic cardiomyopathy: Secondary | ICD-10-CM

## 2014-01-30 DIAGNOSIS — I5022 Chronic systolic (congestive) heart failure: Secondary | ICD-10-CM

## 2014-01-30 MED ORDER — SPIRONOLACTONE 25 MG PO TABS: 25.0000 mg | ORAL_TABLET | Freq: Every day | ORAL | Status: DC

## 2014-01-30 MED ORDER — CARVEDILOL 25 MG PO TABS: 25.0000 mg | ORAL_TABLET | Freq: Two times a day (BID) | ORAL | Status: DC

## 2014-01-30 MED ORDER — LISINOPRIL 40 MG PO TABS: ORAL_TABLET | ORAL | Status: DC

## 2014-01-30 MED ORDER — TORSEMIDE 10 MG PO TABS: 10.0000 mg | ORAL_TABLET | Freq: Every day | ORAL | Status: DC

## 2014-02-05 ENCOUNTER — Other Ambulatory Visit: Payer: Self-pay | Admitting: *Deleted

## 2014-02-05 MED ORDER — AMLODIPINE BESYLATE 5 MG PO TABS: 5.0000 mg | ORAL_TABLET | Freq: Every day | ORAL | Status: DC

## 2014-02-25 ENCOUNTER — Telehealth: Payer: Self-pay | Admitting: *Deleted

## 2014-02-25 NOTE — Telephone Encounter (Signed)
lmtcb to discuss/arrange home sleep study

## 2014-03-12 ENCOUNTER — Other Ambulatory Visit (INDEPENDENT_AMBULATORY_CARE_PROVIDER_SITE_OTHER): Payer: BLUE CROSS/BLUE SHIELD | Admitting: *Deleted

## 2014-03-12 ENCOUNTER — Encounter: Payer: Self-pay | Admitting: Physician Assistant

## 2014-03-12 ENCOUNTER — Ambulatory Visit (INDEPENDENT_AMBULATORY_CARE_PROVIDER_SITE_OTHER): Payer: BLUE CROSS/BLUE SHIELD | Admitting: Physician Assistant

## 2014-03-12 VITALS — BP 130/60 | HR 66 | Ht 66.0 in | Wt 241.0 lb

## 2014-03-12 DIAGNOSIS — I5042 Chronic combined systolic (congestive) and diastolic (congestive) heart failure: Secondary | ICD-10-CM

## 2014-03-12 DIAGNOSIS — I5022 Chronic systolic (congestive) heart failure: Secondary | ICD-10-CM

## 2014-03-12 DIAGNOSIS — I10 Essential (primary) hypertension: Secondary | ICD-10-CM

## 2014-03-12 DIAGNOSIS — E785 Hyperlipidemia, unspecified: Secondary | ICD-10-CM

## 2014-03-12 DIAGNOSIS — Z79899 Other long term (current) drug therapy: Secondary | ICD-10-CM

## 2014-03-12 DIAGNOSIS — R609 Edema, unspecified: Secondary | ICD-10-CM

## 2014-03-12 DIAGNOSIS — R0683 Snoring: Secondary | ICD-10-CM

## 2014-03-12 DIAGNOSIS — I255 Ischemic cardiomyopathy: Secondary | ICD-10-CM

## 2014-03-12 DIAGNOSIS — I2510 Atherosclerotic heart disease of native coronary artery without angina pectoris: Secondary | ICD-10-CM

## 2014-03-12 MED ORDER — CARVEDILOL 25 MG PO TABS: 25.0000 mg | ORAL_TABLET | Freq: Two times a day (BID) | ORAL | Status: DC

## 2014-03-12 MED ORDER — NITROGLYCERIN 0.4 MG SL SUBL: 0.4000 mg | SUBLINGUAL_TABLET | SUBLINGUAL | Status: DC | PRN
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
## 2014-03-12 NOTE — Patient Instructions (Addendum)
Your physician has recommended that you have a SPLIT NIGHT sleep study. This test records several body functions during sleep, including: brain activity, eye movement, oxygen and carbon dioxide blood levels, heart rate and rhythm, breathing rate and rhythm, the flow of air through your mouth and nose, snoring, body muscle movements, and chest and belly movement.  REFILL SENT IN FOR COREG # 180 X 3  REFILL FOR NTG  YOU HAVE BEEN GIVEN AN ORDER FOR COMPRESSION STOCKINGS

## 2014-03-13 LAB — BASIC METABOLIC PANEL
BUN: 26 mg/dL — ABNORMAL HIGH (ref 6–23)
CALCIUM: 9.5 mg/dL (ref 8.4–10.5)
CO2: 23 meq/L (ref 19–32)
CREATININE: 1.66 mg/dL — AB (ref 0.40–1.50)
Chloride: 107 mEq/L (ref 96–112)
GFR: 44.9 mL/min — AB (ref >60.00)
GLUCOSE: 108 mg/dL — AB (ref 70–99)
Potassium: 4.2 mEq/L (ref 3.5–5.1)
Sodium: 140 mEq/L (ref 135–145)

## 2014-03-16 NOTE — Telephone Encounter (Signed)
Patient seen by Richardson Dopp, PA on 1/28.  Scheduled for split night sleep study instead.

## 2014-03-20 ENCOUNTER — Telehealth: Payer: Self-pay

## 2014-03-20 NOTE — Telephone Encounter (Signed)
called to give pt lab results and Dr.Klein's recommendation.lmtcb

## 2014-03-20 NOTE — Telephone Encounter (Signed)
-----   Message from Deboraha Sprang, MD sent at 03/19/2014  6:22 PM EST ----- Please Inform Patient that kidney function abnormalities modestly worse. Please have him decrease his lisinopril from 40--20. Please repeat a metabolic profile 2 weeks thereafter and send it to Dr. Tamala Julian. Thanks

## 2014-03-24 NOTE — Telephone Encounter (Signed)
-----   Message from Deboraha Sprang, MD sent at 03/19/2014  6:22 PM EST ----- Please Inform Patient that kidney function abnormalities modestly worse. Please have him decrease his lisinopril from 40--20. Please repeat a metabolic profile 2 weeks thereafter and send it to Dr. Tamala Julian. Thanks

## 2014-03-24 NOTE — Telephone Encounter (Signed)
attempted to call pt X2 phone line busy

## 2014-03-31 NOTE — Telephone Encounter (Signed)
F/U       Pt called back states he cannot answer when he is working.   Pt states that he is normally available after 5pm.   Please return call.

## 2014-03-31 NOTE — Telephone Encounter (Signed)
Returned pt call.lmtcb 

## 2014-03-31 NOTE — Telephone Encounter (Signed)
F/U ° ° ° ° ° ° ° ° ° °Pt returning call. Please call back.  °

## 2014-04-01 NOTE — Telephone Encounter (Signed)
attempted to reach pt after 5pm as requested.lmtcb

## 2014-04-08 ENCOUNTER — Other Ambulatory Visit: Payer: BLUE CROSS/BLUE SHIELD

## 2014-04-08 ENCOUNTER — Other Ambulatory Visit: Payer: Self-pay | Admitting: *Deleted

## 2014-04-08 DIAGNOSIS — R799 Abnormal finding of blood chemistry, unspecified: Secondary | ICD-10-CM

## 2014-04-08 DIAGNOSIS — R7989 Other specified abnormal findings of blood chemistry: Secondary | ICD-10-CM

## 2014-04-08 NOTE — Telephone Encounter (Signed)
See lab documentation from 12/3 for this.

## 2014-04-30 ENCOUNTER — Other Ambulatory Visit (INDEPENDENT_AMBULATORY_CARE_PROVIDER_SITE_OTHER): Payer: BLUE CROSS/BLUE SHIELD | Admitting: *Deleted

## 2014-04-30 DIAGNOSIS — R7989 Other specified abnormal findings of blood chemistry: Secondary | ICD-10-CM

## 2014-04-30 DIAGNOSIS — R799 Abnormal finding of blood chemistry, unspecified: Secondary | ICD-10-CM

## 2014-04-30 LAB — BASIC METABOLIC PANEL
BUN: 25 mg/dL — AB (ref 6–23)
CO2: 26 mEq/L (ref 19–32)
CREATININE: 1.36 mg/dL (ref 0.40–1.50)
Calcium: 9.1 mg/dL (ref 8.4–10.5)
Chloride: 107 mEq/L (ref 96–112)
GFR: 56.49 mL/min — ABNORMAL LOW (ref >60.00)
Glucose, Bld: 180 mg/dL — ABNORMAL HIGH (ref 70–99)
Potassium: 4.3 mEq/L (ref 3.5–5.1)
Sodium: 138 mEq/L (ref 135–145)

## 2014-05-19 ENCOUNTER — Other Ambulatory Visit: Payer: Self-pay | Admitting: Internal Medicine

## 2014-05-22 ENCOUNTER — Ambulatory Visit (HOSPITAL_BASED_OUTPATIENT_CLINIC_OR_DEPARTMENT_OTHER): Payer: BLUE CROSS/BLUE SHIELD | Attending: Physician Assistant | Admitting: *Deleted

## 2014-05-22 VITALS — Ht 68.0 in | Wt 240.0 lb

## 2014-05-22 DIAGNOSIS — R0683 Snoring: Secondary | ICD-10-CM | POA: Insufficient documentation

## 2014-05-22 DIAGNOSIS — G4733 Obstructive sleep apnea (adult) (pediatric): Secondary | ICD-10-CM

## 2014-06-06 ENCOUNTER — Telehealth: Payer: Self-pay | Admitting: Cardiology

## 2014-06-06 NOTE — Sleep Study (Signed)
NAME: Kenneth Giles DATE OF BIRTH:  05/27/1950 MEDICAL RECORD NUMBER [id number]  LOCATION: [facility]  PHYSICIAN: Cayetano Mikita R  DATE OF STUDY: 05/22/2014  SLEEP STUDY TYPE: Nocturnal Polysomnogram               REFERRING PHYSICIAN: Richardson Dopp T, PA-C  INDICATION FOR STUDY: snoring, excessive daytime sleepiness,   EPWORTH SLEEPINESS SCORE: 39 HEIGHT: 5\' 8"  (172.7 cm)  WEIGHT: 240 lb (108.863 kg)    Body mass index is 36.5 kg/(m^2).  NECK SIZE: 17 in.  MEDICATIONS: Reviewed in the chart  SLEEP ARCHITECTURE: During the diagnostic portion of the study, the patient slept for a total of 156 minutes out of a total sleep period of 181 minutes.  There was no slow wave or REM sleep.  The onset to sleep latency was normal at 8 minutes.  The sleep efficiency was slightly reduced at 82%.  During the CPAP portion of the study, the patient slept for a total of 189 minutes out of a total sleep period of 201 minutes.  There was no slow wave sleep and 46 minutes of REM sleep.  The onset to sleep latency was 4 minutes and the onset to REM sleep latency was short at 65 minutes.  The sleep efficiency was normal at 92%.    RESPIRATORY DATA: During the diagnostic portion of the study, the patient had a total of 75 hypopneas.  There were no apneas.  The AHI was 37.2 events per hour consistent with severe obstructive sleep apnea/hypopnea syndrome. All events occurred during NREM sleep and most occurred in the non supine position.  There was severe snoring.  The patient was started on CPAP at 5cm H2O and titrated for respiratory events and snoring to 12cm H2O. The patient was able to achieve REM sleep but unable to maintain the supine position at an opitmum pressure of 10cm H2O without further respiratory events.  Snoring was eliminated with CPAP.   OXYGEN DATA: During the diagnostic portion of the study, the average oxygen saturation was 93% and the lowest oxygen saturation was 82%.   During the CPAP titration, the average oxygen saturation was 94% and the lowest oxygen saturation was 88%.    CARDIAC DATA: The patient maintained NSR with an intraventricular conduction delay.  The average heart rate was 65 bpm.  The lowest heart rate was 43 bpm.    MOVEMENT/PARASOMNIA: There were an increased number of periodic limb movements with a PLMS index of 29.1 movements per hour during the diagnostic portions and 72.1 movements per hour during the CPAP titration.  IMPRESSION/ RECOMMENDATION:   1.  Severe obstructive sleep apnea/hypopnea syndrome with an AHI of 37.2 events per hour.   All events occurred during NREM sleep and most occurred in the non supine position. 2.  Reduced sleep efficiency with increased frequency of arouals. 3.  Abnormal sleep architecture with no REM or slow wave sleep during the diagnostic portion of the study. 4.  Periodic limb movement noted during the diagnostic and CPAP portions of the study. 5.  The patient maintained NSR with intraventricular conduction delay. 6.  Oxygen desaturations occurred with respiratory events with the lowest oxygen saturation being 82% during the diagnostic portion and 88% during the CPAP titration. 7.  Severe snoring which resolved with CPAP. 8.  Successful CPAP titration to 10cm H2O. The patient was able to achieve REM sleep but unable to maintain the supine position at an opitmum pressure of 10cm H2O without further  respiratory events.  9.  Recommend ResMed CPAP with heated humidifier set at 10cm H2O with a medium [employer] Simplus Full Face Mask  Signed: Sueanne Margarita Diplomate, American Board of Sleep Medicine  ELECTRONICALLY SIGNED ON:  06/06/2014, 4:38 PM [place] PH: [phone]   FX: [phone] [place]

## 2014-06-06 NOTE — Telephone Encounter (Signed)
Please let patient know that they have sleep apnea and had successful PAP titration. Please setup appointment in 10 weeks. Please let [facility] know that order for PAP is in EPIC.

## 2014-06-06 NOTE — Addendum Note (Signed)
Addended by: Sueanne Margarita on: 06/06/2014 04:57 PM   Modules accepted: Orders

## 2014-06-08 NOTE — Telephone Encounter (Signed)
Informed patient of results and verbal understanding expressed.   10 week OV scheduled for 08/28/2014 at 0800. [place] notified that orders are placed.

## 2014-06-09 ENCOUNTER — Encounter: Payer: Self-pay | Admitting: Physician Assistant

## 2014-06-12 ENCOUNTER — Ambulatory Visit (INDEPENDENT_AMBULATORY_CARE_PROVIDER_SITE_OTHER): Payer: BLUE CROSS/BLUE SHIELD | Admitting: Interventional Cardiology

## 2014-06-12 ENCOUNTER — Encounter: Payer: Self-pay | Admitting: Interventional Cardiology

## 2014-06-12 VITALS — BP 124/68 | HR 66 | Ht 68.0 in | Wt 244.4 lb

## 2014-06-12 DIAGNOSIS — E785 Hyperlipidemia, unspecified: Secondary | ICD-10-CM

## 2014-06-12 DIAGNOSIS — I255 Ischemic cardiomyopathy: Secondary | ICD-10-CM

## 2014-06-12 DIAGNOSIS — I5022 Chronic systolic (congestive) heart failure: Secondary | ICD-10-CM

## 2014-06-12 DIAGNOSIS — Z951 Presence of aortocoronary bypass graft: Secondary | ICD-10-CM

## 2014-06-12 DIAGNOSIS — I10 Essential (primary) hypertension: Secondary | ICD-10-CM | POA: Diagnosis not present

## 2014-06-12 MED ORDER — LISINOPRIL 20 MG PO TABS: 20.0000 mg | ORAL_TABLET | Freq: Every day | ORAL | Status: DC
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
## 2014-06-12 NOTE — Patient Instructions (Addendum)
Medication Instructions:  Your physician recommends that you continue on your current medications as directed. Please refer to the Current Medication list given to you today.   Labwork: Your physician recommends that you return for lab work in: 6 months ([facility])  Testing/Procedures: None   Follow-Up: Your physician wants you to follow-up in: 1 year with Dr.Smith You will receive a reminder letter in the mail two months in advance. If you don't receive a letter, please call our office to schedule the follow-up appointment.   Any Other Special Instructions Will Be Listed Below (If Applicable).

## 2014-08-28 ENCOUNTER — Ambulatory Visit: Payer: BLUE CROSS/BLUE SHIELD | Admitting: Cardiology

## 2014-09-18 ENCOUNTER — Other Ambulatory Visit: Payer: Self-pay | Admitting: Internal Medicine

## 2014-09-18 ENCOUNTER — Other Ambulatory Visit: Payer: Self-pay

## 2014-09-18 MED ORDER — SPIRONOLACTONE 25 MG PO TABS: 25.0000 mg | ORAL_TABLET | Freq: Every day | ORAL | Status: DC

## 2014-10-21 ENCOUNTER — Ambulatory Visit: Payer: BLUE CROSS/BLUE SHIELD | Admitting: Cardiology

## 2015-01-11 ENCOUNTER — Other Ambulatory Visit: Payer: Self-pay | Admitting: Nurse Practitioner

## 2015-01-11 ENCOUNTER — Ambulatory Visit
Admission: RE | Admit: 2015-01-11 | Discharge: 2015-01-11 | Disposition: A | Discharge status: Home / Self Care | Payer: BLUE CROSS/BLUE SHIELD | Source: Ambulatory Visit | Attending: Nurse Practitioner | Admitting: Nurse Practitioner

## 2015-01-11 DIAGNOSIS — M25561 Pain in right knee: Secondary | ICD-10-CM

## 2015-01-21 ENCOUNTER — Other Ambulatory Visit: Payer: Self-pay | Admitting: Orthopedic Surgery

## 2015-01-21 DIAGNOSIS — M25561 Pain in right knee: Secondary | ICD-10-CM

## 2015-01-28 ENCOUNTER — Other Ambulatory Visit: Payer: Self-pay | Admitting: *Deleted

## 2015-01-28 MED ORDER — LISINOPRIL 20 MG PO TABS: 20.0000 mg | ORAL_TABLET | Freq: Every day | ORAL | Status: DC

## 2015-01-28 MED ORDER — SPIRONOLACTONE 25 MG PO TABS: 25.0000 mg | ORAL_TABLET | Freq: Every day | ORAL | Status: DC

## 2015-01-31 ENCOUNTER — Ambulatory Visit
Admission: RE | Admit: 2015-01-31 | Discharge: 2015-01-31 | Disposition: A | Discharge status: Home / Self Care | Payer: BLUE CROSS/BLUE SHIELD | Source: Ambulatory Visit | Attending: Orthopedic Surgery | Admitting: Orthopedic Surgery

## 2015-01-31 DIAGNOSIS — M25561 Pain in right knee: Secondary | ICD-10-CM

## 2015-02-06 ENCOUNTER — Other Ambulatory Visit: Payer: Self-pay | Admitting: Physician Assistant

## 2015-02-17 ENCOUNTER — Telehealth: Payer: Self-pay | Admitting: Interventional Cardiology

## 2015-02-17 NOTE — Telephone Encounter (Signed)
New problem   Dr Watt Climes want to know if it's all right for pt to have a colonoscopy done on 1.6.17. Please advise

## 2015-02-17 NOTE — Telephone Encounter (Signed)
Message fwd to Dr.Smith to advise 

## 2015-02-21 NOTE — Telephone Encounter (Signed)
Yes

## 2015-03-03 ENCOUNTER — Encounter (HOSPITAL_COMMUNITY): Payer: Self-pay

## 2015-03-03 ENCOUNTER — Emergency Department (HOSPITAL_COMMUNITY)
Admission: EM | Admit: 2015-03-03 | Discharge: 2015-03-03 | Disposition: A | Discharge status: Home / Self Care | Payer: BLUE CROSS/BLUE SHIELD | Attending: Emergency Medicine | Admitting: Emergency Medicine

## 2015-03-03 DIAGNOSIS — Z862 Personal history of diseases of the blood and blood-forming organs and certain disorders involving the immune mechanism: Secondary | ICD-10-CM | POA: Diagnosis not present

## 2015-03-03 DIAGNOSIS — E119 Type 2 diabetes mellitus without complications: Secondary | ICD-10-CM | POA: Insufficient documentation

## 2015-03-03 DIAGNOSIS — Z86010 Personal history of colonic polyps: Secondary | ICD-10-CM | POA: Diagnosis not present

## 2015-03-03 DIAGNOSIS — Z8709 Personal history of other diseases of the respiratory system: Secondary | ICD-10-CM | POA: Diagnosis not present

## 2015-03-03 DIAGNOSIS — Z87442 Personal history of urinary calculi: Secondary | ICD-10-CM | POA: Insufficient documentation

## 2015-03-03 DIAGNOSIS — E7800 Pure hypercholesterolemia, unspecified: Secondary | ICD-10-CM | POA: Diagnosis not present

## 2015-03-03 DIAGNOSIS — Y998 Other external cause status: Secondary | ICD-10-CM | POA: Diagnosis not present

## 2015-03-03 DIAGNOSIS — I10 Essential (primary) hypertension: Secondary | ICD-10-CM | POA: Diagnosis not present

## 2015-03-03 DIAGNOSIS — Z9861 Coronary angioplasty status: Secondary | ICD-10-CM | POA: Diagnosis not present

## 2015-03-03 DIAGNOSIS — I5022 Chronic systolic (congestive) heart failure: Secondary | ICD-10-CM | POA: Insufficient documentation

## 2015-03-03 DIAGNOSIS — Z7982 Long term (current) use of aspirin: Secondary | ICD-10-CM | POA: Insufficient documentation

## 2015-03-03 DIAGNOSIS — M1990 Unspecified osteoarthritis, unspecified site: Secondary | ICD-10-CM | POA: Diagnosis not present

## 2015-03-03 DIAGNOSIS — G8929 Other chronic pain: Secondary | ICD-10-CM | POA: Diagnosis not present

## 2015-03-03 DIAGNOSIS — K219 Gastro-esophageal reflux disease without esophagitis: Secondary | ICD-10-CM | POA: Diagnosis not present

## 2015-03-03 DIAGNOSIS — S39012A Strain of muscle, fascia and tendon of lower back, initial encounter: Secondary | ICD-10-CM | POA: Diagnosis not present

## 2015-03-03 DIAGNOSIS — Z8701 Personal history of pneumonia (recurrent): Secondary | ICD-10-CM | POA: Insufficient documentation

## 2015-03-03 DIAGNOSIS — Z79899 Other long term (current) drug therapy: Secondary | ICD-10-CM | POA: Insufficient documentation

## 2015-03-03 DIAGNOSIS — R202 Paresthesia of skin: Secondary | ICD-10-CM | POA: Diagnosis not present

## 2015-03-03 DIAGNOSIS — Z87448 Personal history of other diseases of urinary system: Secondary | ICD-10-CM | POA: Insufficient documentation

## 2015-03-03 DIAGNOSIS — Y9289 Other specified places as the place of occurrence of the external cause: Secondary | ICD-10-CM | POA: Insufficient documentation

## 2015-03-03 DIAGNOSIS — M62830 Muscle spasm of back: Secondary | ICD-10-CM

## 2015-03-03 DIAGNOSIS — Z9889 Other specified postprocedural states: Secondary | ICD-10-CM | POA: Insufficient documentation

## 2015-03-03 DIAGNOSIS — Z951 Presence of aortocoronary bypass graft: Secondary | ICD-10-CM | POA: Diagnosis not present

## 2015-03-03 DIAGNOSIS — Z791 Long term (current) use of non-steroidal anti-inflammatories (NSAID): Secondary | ICD-10-CM | POA: Diagnosis not present

## 2015-03-03 DIAGNOSIS — Z7984 Long term (current) use of oral hypoglycemic drugs: Secondary | ICD-10-CM | POA: Insufficient documentation

## 2015-03-03 DIAGNOSIS — M545 Low back pain: Secondary | ICD-10-CM

## 2015-03-03 DIAGNOSIS — K5900 Constipation, unspecified: Secondary | ICD-10-CM | POA: Insufficient documentation

## 2015-03-03 DIAGNOSIS — X58XXXA Exposure to other specified factors, initial encounter: Secondary | ICD-10-CM | POA: Insufficient documentation

## 2015-03-03 DIAGNOSIS — I2510 Atherosclerotic heart disease of native coronary artery without angina pectoris: Secondary | ICD-10-CM | POA: Diagnosis not present

## 2015-03-03 DIAGNOSIS — Z86018 Personal history of other benign neoplasm: Secondary | ICD-10-CM | POA: Insufficient documentation

## 2015-03-03 DIAGNOSIS — Y9389 Activity, other specified: Secondary | ICD-10-CM | POA: Insufficient documentation

## 2015-03-03 MED ORDER — NAPROXEN 500 MG PO TABS: 500.0000 mg | ORAL_TABLET | Freq: Two times a day (BID) | ORAL | Status: DC | PRN

## 2015-03-03 MED ORDER — CYCLOBENZAPRINE HCL 10 MG PO TABS: 10.0000 mg | ORAL_TABLET | Freq: Three times a day (TID) | ORAL | Status: DC | PRN

## 2015-03-03 MED ORDER — OXYCODONE-ACETAMINOPHEN 5-325 MG PO TABS
1.0000 | ORAL_TABLET | Freq: Once | ORAL | Status: AC
  Administered 2015-03-03: 1 via ORAL

## 2015-03-03 MED ORDER — OXYCODONE-ACETAMINOPHEN 5-325 MG PO TABS
ORAL_TABLET | ORAL | Status: AC
  Filled 2015-03-03: qty 1

## 2015-03-03 MED ORDER — OXYCODONE-ACETAMINOPHEN 5-325 MG PO TABS
1.0000 | ORAL_TABLET | Freq: Four times a day (QID) | ORAL | Status: DC | PRN
Start: 2015-03-03 — End: 2015-11-05

## 2015-03-03 NOTE — ED Notes (Signed)
Pt st's he was coughing earlier today and developed pain in right lower back.  St's at times he has tingling down right leg

## 2015-03-03 NOTE — Discharge Instructions (Signed)
Back Pain:  Your back pain should be treated with medicines such as ibuprofen or aleve and this back pain should get better over the next 2 weeks.  However if you develop severe or worsening pain, low back pain with fever, numbness, weakness or inability to walk or urinate, you should return to the ER immediately.  Please follow up with your doctor this week for a recheck if still having symptoms.  Avoid heavy lifting over 10 pounds over the next two weeks.  Low back pain is discomfort in the lower back that may be due to injuries to muscles and ligaments around the spine.  Occasionally, it may be caused by a a problem to a part of the spine called a disc.  The pain may last several days or a week;  However, most patients get completely well in 4 weeks.  Self - care:  The application of heat can help soothe the pain.  Maintaining your daily activities, including walking, is encourged, as it will help you get better faster than just staying in bed. Perform gentle stretching as discussed. Drink plenty of fluids.  Medications are also useful to help with pain control.  A commonly prescribed medication includes percocet.  Do not drive or operate heavy machinery while taking this medication.  Non steroidal anti inflammatory medications including Ibuprofen and naproxen;  These medications help both pain and swelling and are very useful in treating back pain.  They should be taken with food, as they can cause stomach upset, and more seriously, stomach bleeding.    Muscle relaxants:  These medications can help with muscle tightness that is a cause of lower back pain.  Most of these medications can cause drowsiness, and it is not safe to drive or use dangerous machinery while taking them.  SEEK IMMEDIATE MEDICAL ATTENTION IF: New numbness, tingling, weakness, or problem with the use of your arms or legs.  Severe back pain not relieved with medications.  Difficulty with or loss of control of your bowel or  bladder control.  Increasing pain in any areas of the body (such as chest or abdominal pain).  Shortness of breath, dizziness or fainting.  Nausea (feeling sick to your stomach), vomiting, fever, or sweats.  You will need to follow up with  Your primary healthcare provider in 3-5 days for reassessment.   Back Pain, Adult Back pain is very common. The pain often gets better over time. The cause of back pain is usually not dangerous. Most people can learn to manage their back pain on their own.  HOME CARE  Watch your back pain for any changes. The following actions may help to lessen any pain you are feeling:  Stay active. Start with short walks on flat ground if you can. Try to walk farther each day.  Exercise regularly as told by your doctor. Exercise helps your back heal faster. It also helps avoid future injury by keeping your muscles strong and flexible.  Do not sit, drive, or stand in one place for more than 30 minutes.  Do not stay in bed. Resting more than 1-2 days can slow down your recovery.  Be careful when you bend or lift an object. Use good form when lifting:  Bend at your knees.  Keep the object close to your body.  Do not twist.  Sleep on a firm mattress. Lie on your side, and bend your knees. If you lie on your back, put a pillow under your knees.  Take medicines  only as told by your doctor.  Put ice on the injured area.  Put ice in a plastic bag.  Place a towel between your skin and the bag.  Leave the ice on for 20 minutes, 2-3 times a day for the first 2-3 days. After that, you can switch between ice and heat packs.  Avoid feeling anxious or stressed. Find good ways to deal with stress, such as exercise.  Maintain a healthy weight. Extra weight puts stress on your back. GET HELP IF:   You have pain that does not go away with rest or medicine.  You have worsening pain that goes down into your legs or buttocks.  You have pain that does not get better  in one week.  You have pain at night.  You lose weight.  You have a fever or chills. GET HELP RIGHT AWAY IF:   You cannot control when you poop (bowel movement) or pee (urinate).  Your arms or legs feel weak.  Your arms or legs lose feeling (numbness).  You feel sick to your stomach (nauseous) or throw up (vomit).  You have belly (abdominal) pain.  You feel like you may pass out (faint).   This information is not intended to replace advice given to you by your health care provider. Make sure you discuss any questions you have with your health care provider.   Document Released: 07/19/2007 Document Revised: 02/20/2014 Document Reviewed: 06/03/2013 Elsevier Interactive Patient Education 2016 [place] Injury Prevention Back injuries can be very painful. They can also be difficult to heal. After having one back injury, you are more likely to injure your back again. It is important to learn how to avoid injuring or re-injuring your back. The following tips can help you to prevent a back injury. WHAT SHOULD I KNOW ABOUT PHYSICAL FITNESS?  Exercise for 30 minutes per day on most days of the week or as told by your doctor. Make sure to:  Do aerobic exercises, such as walking, jogging, biking, or swimming.  Do exercises that increase balance and strength, such as tai chi and yoga.  Do stretching exercises. This helps with flexibility.  Try to develop strong belly (abdominal) muscles. Your belly muscles help to support your back.  Stay at a healthy weight. This helps to decrease your risk of a back injury. WHAT SHOULD I KNOW ABOUT MY DIET?  Talk with your doctor about your overall diet. Take supplements and vitamins only as told by your doctor.  Talk with your doctor about how much calcium and vitamin D you need each day. These nutrients help to prevent weakening of the bones (osteoporosis).  Include good sources of calcium in your diet, such as:  Dairy  products.  Green leafy vegetables.  Products that have had calcium added to them (fortified).  Include good sources of vitamin D in your diet, such as:  Milk.  Foods that have had vitamin D added to them. WHAT SHOULD I KNOW ABOUT MY POSTURE?  Sit up straight and stand up straight. Avoid leaning forward when you sit or hunching over when you stand.  Choose chairs that have good low-back (lumbar) support.  If you work at a desk, sit close to it so you do not need to lean over. Keep your chin tucked in. Keep your neck drawn back. Keep your elbows bent so your arms look like the letter "L" (right angle).  Sit high and close to the steering wheel when you drive. Add  a low-back support to your car seat, if needed.  Avoid sitting or standing in one position for very long. Take breaks to get up, stretch, and walk around at least one time every hour. Take breaks every hour if you are driving for long periods of time.  Sleep on your side with your knees slightly bent, or sleep on your back with a pillow under your knees. Do not lie on the front of your body to sleep. WHAT SHOULD I KNOW ABOUT LIFTING, TWISTING, AND REACHING Lifting and Heavy Lifting  Avoid heavy lifting, especially lifting over and over again. If you must do heavy lifting:  Stretch before lifting.  Work slowly.  Rest between lifts.  Use a tool such as a cart or a dolly to move objects if one is available.  Make several small trips instead of carrying one heavy load.  Ask for help when you need it, especially when moving big objects.  Follow these steps when lifting:  Stand with your feet shoulder-width apart.  Get as close to the object as you can. Do not pick up a heavy object that is far from your body.  Use handles or lifting straps if they are available.  Bend at your knees. Squat down, but keep your heels off the floor.  Keep your shoulders back. Keep your chin tucked in. Keep your back straight.  Lift  the object slowly while you tighten the muscles in your legs, belly, and butt. Keep the object as close to the center of your body as possible.  Follow these steps when putting down a heavy load:  Stand with your feet shoulder-width apart.  Lower the object slowly while you tighten the muscles in your legs, belly, and butt. Keep the object as close to the center of your body as possible.  Keep your shoulders back. Keep your chin tucked in. Keep your back straight.  Bend at your knees. Squat down, but keep your heels off the floor.  Use handles or lifting straps if they are available. Twisting and Reaching  Avoid lifting heavy objects above your waist.  Do not twist at your waist while you are lifting or carrying a load. If you need to turn, move your feet.  Do not bend over without bending at your knees.  Avoid reaching over your head, across a table, or for an object on a high surface.  WHAT ARE SOME OTHER TIPS? 1. Avoid wet floors and icy ground. Keep sidewalks clear of ice to prevent falls.  2. Do not sleep on a mattress that is too soft or too hard.  3. Keep items that you use often within easy reach.  4. Put heavier objects on shelves at waist level, and put lighter objects on lower or higher shelves. 5. Find ways to lower your stress, such as: 1. Exercise. 2. Massage. 3. Relaxation techniques. 6. Talk with your doctor if you feel anxious or depressed. These conditions can make back pain worse. 7. Wear flat heel shoes with cushioned soles. 8. Avoid making quick (sudden) movements. 9. Use both shoulder straps when carrying a backpack. 10. Do not use any tobacco products, including cigarettes, chewing tobacco, or electronic cigarettes. If you need help quitting, ask your doctor.   This information is not intended to replace advice given to you by your health care provider. Make sure you discuss any questions you have with your health care provider.   Document Released:  07/19/2007 Document Revised: 06/16/2014 Document Reviewed: 02/03/2014 Elsevier  Interactive Patient Education 2016 [place].  Back Exercises If you have pain in your back, do these exercises 2-3 times each day or as told by your doctor. When the pain goes away, do the exercises once each day, but repeat the steps more times for each exercise (do more repetitions). If you do not have pain in your back, do these exercises once each day or as told by your doctor. EXERCISES Single Knee to Chest Do these steps 3-5 times in a row for each leg:  Lie on your back on a firm bed or the floor with your legs stretched out.  Bring one knee to your chest.  Hold your knee to your chest by grabbing your knee or thigh.  Pull on your knee until you feel a gentle stretch in your lower back.  Keep doing the stretch for 10-30 seconds.  Slowly let go of your leg and straighten it. Pelvic Tilt Do these steps 5-10 times in a row:  Lie on your back on a firm bed or the floor with your legs stretched out.  Bend your knees so they point up to the ceiling. Your feet should be flat on the floor.  Tighten your lower belly (abdomen) muscles to press your lower back against the floor. This will make your tailbone point up to the ceiling instead of pointing down to your feet or the floor.  Stay in this position for 5-10 seconds while you gently tighten your muscles and breathe evenly. Cat-Cow Do these steps until your lower back bends more easily:  Get on your hands and knees on a firm surface. Keep your hands under your shoulders, and keep your knees under your hips. You may put padding under your knees.  Let your head hang down, and make your tailbone point down to the floor so your lower back is round like the back of a cat.  Stay in this position for 5 seconds.  Slowly lift your head and make your tailbone point up to the ceiling so your back hangs low (sags) like the back of a cow.  Stay in this  position for 5 seconds. Press-Ups Do these steps 5-10 times in a row:  Lie on your belly (face-down) on the floor.  Place your hands near your head, about shoulder-width apart.  While you keep your back relaxed and keep your hips on the floor, slowly straighten your arms to raise the top half of your body and lift your shoulders. Do not use your back muscles. To make yourself more comfortable, you may change where you place your hands.  Stay in this position for 5 seconds.  Slowly return to lying flat on the floor. Bridges Do these steps 10 times in a row:  Lie on your back on a firm surface.  Bend your knees so they point up to the ceiling. Your feet should be flat on the floor.  Tighten your butt muscles and lift your butt off of the floor until your waist is almost as high as your knees. If you do not feel the muscles working in your butt and the back of your thighs, slide your feet 1-2 inches farther away from your butt.  Stay in this position for 3-5 seconds.  Slowly lower your butt to the floor, and let your butt muscles relax. If this exercise is too easy, try doing it with your arms crossed over your chest. Belly Crunches Do these steps 5-10 times in a row: 11. Lie  on your back on a firm bed or the floor with your legs stretched out. 12. Bend your knees so they point up to the ceiling. Your feet should be flat on the floor. 63. Cross your arms over your chest. 14. Tip your chin a little bit toward your chest but do not bend your neck. 26. Tighten your belly muscles and slowly raise your chest just enough to lift your shoulder blades a tiny bit off of the floor. 16. Slowly lower your chest and your head to the floor. Back Lifts Do these steps 5-10 times in a row: 1. Lie on your belly (face-down) with your arms at your sides, and rest your forehead on the floor. 2. Tighten the muscles in your legs and your butt. 3. Slowly lift your chest off of the floor while you keep  your hips on the floor. Keep the back of your head in line with the curve in your back. Look at the floor while you do this. 4. Stay in this position for 3-5 seconds. 5. Slowly lower your chest and your face to the floor. GET HELP IF:  Your back pain gets a lot worse when you do an exercise.  Your back pain does not lessen 2 hours after you exercise. If you have any of these problems, stop doing the exercises. Do not do them again unless your doctor says it is okay. GET HELP RIGHT AWAY IF:  You have sudden, very bad back pain. If this happens, stop doing the exercises. Do not do them again unless your doctor says it is okay.   This information is not intended to replace advice given to you by your health care provider. Make sure you discuss any questions you have with your health care provider.   Document Released: 03/04/2010 Document Revised: 10/21/2014 Document Reviewed: 03/26/2014 Elsevier Interactive Patient Education 2016 [place] therapy can help ease sore, stiff, injured, and tight muscles and joints. Heat relaxes your muscles, which may help ease your pain. Heat therapy should only be used on old, pre-existing, or long-lasting (chronic) injuries. Do not use heat therapy unless told by your doctor. HOW TO USE HEAT THERAPY There are several different kinds of heat therapy, including:  Moist heat pack.  Warm water bath.  Hot water bottle.  Electric heating pad.  Heated gel pack.  Heated wrap.  Electric heating pad. GENERAL HEAT THERAPY RECOMMENDATIONS   Do not sleep while using heat therapy. Only use heat therapy while you are awake.  Your skin may turn pink while using heat therapy. Do not use heat therapy if your skin turns red.  Do not use heat therapy if you have new pain.  High heat or long exposure to heat can cause burns. Be careful when using heat therapy to avoid burning your skin.  Do not use heat therapy on areas of your skin that  are already irritated, such as with a rash or sunburn. GET HELP IF:   You have blisters, redness, swelling (puffiness), or numbness.  You have new pain.  Your pain is worse. MAKE SURE YOU:  Understand these instructions.  Will watch your condition.  Will get help right away if you are not doing well or get worse.   This information is not intended to replace advice given to you by your health care provider. Make sure you discuss any questions you have with your health care provider.   Document Released: 04/24/2011 Document Revised: 02/20/2014 Document Reviewed:  03/25/2013 Elsevier Interactive Patient Education 2016 [employer]  Lumbosacral Strain Lumbosacral strain is a strain of any of the parts that make up your lumbosacral vertebrae. Your lumbosacral vertebrae are the bones that make up the lower third of your backbone. Your lumbosacral vertebrae are held together by muscles and tough, fibrous tissue (ligaments).  CAUSES  A sudden blow to your back can cause lumbosacral strain. Also, anything that causes an excessive stretch of the muscles in the low back can cause this strain. This is typically seen when people exert themselves strenuously, fall, lift heavy objects, bend, or crouch repeatedly. RISK FACTORS  Physically demanding work.  Participation in pushing or pulling sports or sports that require a sudden twist of the back (tennis, golf, baseball).  Weight lifting.  Excessive lower back curvature.  Forward-tilted pelvis.  Weak back or abdominal muscles or both.  Tight hamstrings. SIGNS AND SYMPTOMS  Lumbosacral strain may cause pain in the area of your injury or pain that moves (radiates) down your leg.  DIAGNOSIS Your health care provider can often diagnose lumbosacral strain through a physical exam. In some cases, you may need tests such as X-ray exams.  TREATMENT  Treatment for your lower back injury depends on many factors that your clinician will have to  evaluate. However, most treatment will include the use of anti-inflammatory medicines. HOME CARE INSTRUCTIONS   Avoid hard physical activities (tennis, racquetball, waterskiing) if you are not in proper physical condition for it. This may aggravate or create problems.  If you have a back problem, avoid sports requiring sudden body movements. Swimming and walking are generally safer activities.  Maintain good posture.  Maintain a healthy weight.  For acute conditions, you may put ice on the injured area.  Put ice in a plastic bag.  Place a towel between your skin and the bag.  Leave the ice on for 20 minutes, 2-3 times a day.  When the low back starts healing, stretching and strengthening exercises may be recommended. SEEK MEDICAL CARE IF:  Your back pain is getting worse.  You experience severe back pain not relieved with medicines. SEEK IMMEDIATE MEDICAL CARE IF:   You have numbness, tingling, weakness, or problems with the use of your arms or legs.  There is a change in bowel or bladder control.  You have increasing pain in any area of the body, including your belly (abdomen).  You notice shortness of breath, dizziness, or feel faint.  You feel sick to your stomach (nauseous), are throwing up (vomiting), or become sweaty.  You notice discoloration of your toes or legs, or your feet get very cold. MAKE SURE YOU:   Understand these instructions.  Will watch your condition.  Will get help right away if you are not doing well or get worse.   This information is not intended to replace advice given to you by your health care provider. Make sure you discuss any questions you have with your health care provider.   Document Released: 11/09/2004 Document Revised: 02/20/2014 Document Reviewed: 09/18/2012 Elsevier Interactive Patient Education 2016 [employer]  Muscle Cramps and Spasms Muscle cramps and spasms are when muscles tighten by themselves. They usually get  better within minutes. Muscle cramps are painful. They are usually stronger and last longer than muscle spasms. Muscle spasms may or may not be painful. They can last a few seconds or much longer. HOME CARE  Drink enough fluid to keep your pee (urine) clear or pale yellow.  Massage, stretch, and  relax the muscle.  Use a warm towel, heating pad, or warm shower water on tight muscles.  Place ice on the muscle if it is tender or in pain.  Put ice in a plastic bag.  Place a towel between your skin and the bag.  Leave the ice on for 15-20 minutes, 03-04 times a day.  Only take medicine as told by your doctor. GET HELP RIGHT AWAY IF:  Your cramps or spasms get worse, happen more often, or do not get better with time. MAKE SURE YOU:  Understand these instructions.  Will watch your condition.  Will get help right away if you are not doing well or get worse.   This information is not intended to replace advice given to you by your health care provider. Make sure you discuss any questions you have with your health care provider.   Document Released: 01/13/2008 Document Revised: 05/27/2012 Document Reviewed: 01/17/2012 Elsevier Interactive Patient Education [employer].
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
## 2015-03-03 NOTE — ED Notes (Signed)
Pt reports left lower back pain after coughing from taking a pill. He states the pain is worsened with any movement and the pain radiates down his right leg.

## 2015-03-03 NOTE — ED Notes (Signed)
Pt reports he has no allergy to percocet and can take it.[name]

## 2015-03-11 ENCOUNTER — Telehealth: Payer: Self-pay | Admitting: Interventional Cardiology

## 2015-03-11 ENCOUNTER — Telehealth: Payer: Self-pay

## 2015-03-11 NOTE — Telephone Encounter (Signed)
Walk in pt form-[facility]-Clearance form dropped off gave to [place]

## 2015-03-11 NOTE — Telephone Encounter (Signed)
Signed cardiac clearance place in MR nurse fax box to be faxed to [facility] Ip attn: Claiborne Billings fax # [phone]

## 2015-05-16 ENCOUNTER — Other Ambulatory Visit: Payer: Self-pay | Admitting: Interventional Cardiology

## 2015-05-27 DIAGNOSIS — S83221D Peripheral tear of medial meniscus, current injury, right knee, subsequent encounter: Secondary | ICD-10-CM | POA: Diagnosis not present

## 2015-05-31 ENCOUNTER — Other Ambulatory Visit: Payer: Self-pay | Admitting: Physician Assistant

## 2015-06-13 ENCOUNTER — Other Ambulatory Visit: Payer: Self-pay | Admitting: Interventional Cardiology

## 2015-06-30 ENCOUNTER — Other Ambulatory Visit: Payer: Self-pay | Admitting: Physician Assistant

## 2015-07-06 ENCOUNTER — Other Ambulatory Visit: Payer: Self-pay | Admitting: Internal Medicine

## 2015-08-06 DIAGNOSIS — E782 Mixed hyperlipidemia: Secondary | ICD-10-CM | POA: Diagnosis not present

## 2015-08-06 DIAGNOSIS — Z23 Encounter for immunization: Secondary | ICD-10-CM | POA: Diagnosis not present

## 2015-08-06 DIAGNOSIS — Z0000 Encounter for general adult medical examination without abnormal findings: Secondary | ICD-10-CM | POA: Diagnosis not present

## 2015-08-06 DIAGNOSIS — I10 Essential (primary) hypertension: Secondary | ICD-10-CM | POA: Diagnosis not present

## 2015-08-06 DIAGNOSIS — I2510 Atherosclerotic heart disease of native coronary artery without angina pectoris: Secondary | ICD-10-CM | POA: Diagnosis not present

## 2015-08-06 DIAGNOSIS — E1140 Type 2 diabetes mellitus with diabetic neuropathy, unspecified: Secondary | ICD-10-CM | POA: Diagnosis not present

## 2015-08-13 DIAGNOSIS — E1140 Type 2 diabetes mellitus with diabetic neuropathy, unspecified: Secondary | ICD-10-CM | POA: Diagnosis not present

## 2015-08-13 DIAGNOSIS — E782 Mixed hyperlipidemia: Secondary | ICD-10-CM | POA: Diagnosis not present

## 2015-08-13 DIAGNOSIS — Z79899 Other long term (current) drug therapy: Secondary | ICD-10-CM | POA: Diagnosis not present

## 2015-08-13 DIAGNOSIS — Z0000 Encounter for general adult medical examination without abnormal findings: Secondary | ICD-10-CM | POA: Diagnosis not present

## 2015-08-14 ENCOUNTER — Other Ambulatory Visit: Payer: Self-pay | Admitting: Physician Assistant

## 2015-08-16 ENCOUNTER — Other Ambulatory Visit: Payer: Self-pay | Admitting: *Deleted

## 2015-08-16 MED ORDER — CARVEDILOL 25 MG PO TABS: 25.0000 mg | ORAL_TABLET | Freq: Two times a day (BID) | ORAL | Status: DC

## 2015-09-03 ENCOUNTER — Other Ambulatory Visit: Payer: Self-pay | Admitting: Physician Assistant

## 2015-09-22 DIAGNOSIS — S91202A Unspecified open wound of left great toe with damage to nail, initial encounter: Secondary | ICD-10-CM | POA: Diagnosis not present

## 2015-09-23 ENCOUNTER — Other Ambulatory Visit: Payer: Self-pay | Admitting: Physician Assistant

## 2015-10-04 ENCOUNTER — Other Ambulatory Visit: Payer: Self-pay | Admitting: Internal Medicine

## 2015-10-12 DIAGNOSIS — I10 Essential (primary) hypertension: Secondary | ICD-10-CM | POA: Diagnosis not present

## 2015-10-12 DIAGNOSIS — R110 Nausea: Secondary | ICD-10-CM | POA: Diagnosis not present

## 2015-10-12 DIAGNOSIS — E1140 Type 2 diabetes mellitus with diabetic neuropathy, unspecified: Secondary | ICD-10-CM | POA: Diagnosis not present

## 2015-11-02 ENCOUNTER — Other Ambulatory Visit: Payer: Self-pay | Admitting: Internal Medicine

## 2015-11-05 ENCOUNTER — Other Ambulatory Visit: Payer: Self-pay

## 2015-11-05 ENCOUNTER — Encounter: Payer: Self-pay | Admitting: Interventional Cardiology

## 2015-11-05 ENCOUNTER — Ambulatory Visit (INDEPENDENT_AMBULATORY_CARE_PROVIDER_SITE_OTHER): Payer: BLUE CROSS/BLUE SHIELD | Admitting: Interventional Cardiology

## 2015-11-05 VITALS — BP 124/62 | HR 67 | Ht 67.0 in | Wt 237.0 lb

## 2015-11-05 DIAGNOSIS — I10 Essential (primary) hypertension: Secondary | ICD-10-CM

## 2015-11-05 DIAGNOSIS — I5042 Chronic combined systolic (congestive) and diastolic (congestive) heart failure: Secondary | ICD-10-CM

## 2015-11-05 DIAGNOSIS — Z951 Presence of aortocoronary bypass graft: Secondary | ICD-10-CM

## 2015-11-05 DIAGNOSIS — I447 Left bundle-branch block, unspecified: Secondary | ICD-10-CM

## 2015-11-05 NOTE — Patient Instructions (Signed)
Medication Instructions:  Your physician recommends that you continue on your current medications as directed. Please refer to the Current Medication list given to you today.  Labwork: No new orders.   Testing/Procedures: Your physician has requested that you have an echocardiogram in 11 MONTHS (August 2018). Echocardiography is a painless test that uses sound waves to create images of your heart. It provides your doctor with information about the size and shape of your heart and how well your heart's chambers and valves are working. This procedure takes approximately one hour. There are no restrictions for this procedure.  Follow-Up: Your physician wants you to follow-up in: 1 YEAR with Dr Tamala Julian.  You will receive a reminder letter in the mail two months in advance. If you don't receive a letter, please call our office to schedule the follow-up appointment.   Any Other Special Instructions Will Be Listed Below (If Applicable).     If you need a refill on your cardiac medications before your next appointment, please call your pharmacy.

## 2015-11-05 NOTE — Progress Notes (Signed)
Cardiology Office Note    Date:  11/05/2015   ID:  Kenneth, Giles Aug 18, 1950, MRN [id number]  PCP:  Kenneth Argyle, MD  Cardiologist: Kenneth Grooms, MD   Chief Complaint  Patient presents with  . Coronary Artery Disease    History of Present Illness:  Kenneth Giles is a 65 y.o. male for CAD, CABG, Hypertension, and ischemic cardiomyopathy with EF 43%:   The patient has coronary artery disease. He underwent coronary bypass grafting in 2014. He has done well since that time. His EF is been between 35 and 45% before and after surgery. He denies orthopnea and PND. There is no significant lower extremity swelling. He has not had syncope or angina. Dr. Felipa Giles discontinued spironolactone because of relatively low blood pressures. He is developed no symptoms of dyspnea swelling off that medication.    Past Medical History:  Diagnosis Date  . Allergic rhinitis    takes Zyrtec daily  . Aortic sclerosis (HCC)    a. syst murmur bilat usb->sclerosis by echo 03/2013.  Kenneth Giles Arthritis    hands   . CAD (coronary artery disease)    a. 2014 CABG x 4 (L->LAD, V->Diag, V->OM, Rad->RCA);  b. 03/2013 Cath: LM nl, LAD 80p, LCX 16m, OM1 100, RCA 70-69m, all grafts patent, EF 40%.  . Chronic low back pain    cause unknown  . Chronic systolic CHF (congestive heart failure) (Kenneth Giles)   . Constipation    Citrucel daily  . Diabetes mellitus ([place])    takes Glipizide daily  . Diverticulosis   . Enlarged prostate    slightly  . GERD (gastroesophageal reflux disease)    takes Omeprazole daily  . H/O renal calculi    no treatment needed  . Headache(784.0)    rarely   . History of blood transfusion    no abnormal reaction noted  . History of colon polyps   . Hypercholesteremia    takes Lipitor daily  . Hypertension    takes Metoprolol daily and Lisinopril   . Ischemic cardiomyopathy    a. 03/2013 Echo: EF 25-30%, diff HK, antsep/apical AK, Gr2 DD, Ao sclerosis, mild AI, PASP 36.    Kenneth Giles Left bundle branch block   . Lipoma of back   . Nephrolithiasis   . Nocturia   . OSA (obstructive sleep apnea)   . Peripheral edema    takes Lozol daily  . Peripheral neuropathy (Kenneth Giles)   . Pneumonia    hx of about 10+yrs ago  . Thrombocytopenia (Kenneth Giles)   . Urinary frequency     Past Surgical History:  Procedure Laterality Date  . CARDIAC CATHETERIZATION  2014  . COLONOSCOPY    . CORONARY ARTERY BYPASS GRAFT N/A 08/23/2012   Procedure: CORONARY ARTERY BYPASS GRAFTING times four using Right Greater Saphenous Vein Graft harvested endoscopically and Left Radail Artery Graft;  Surgeon: Kenneth Poot, MD;  Location: [facility];  Service: Open Heart Surgery;  Laterality: N/A;  . fx humurous from Kenneth Giles  2010  . INTRAOPERATIVE TRANSESOPHAGEAL ECHOCARDIOGRAM N/A 08/23/2012   Procedure: INTRAOPERATIVE TRANSESOPHAGEAL ECHOCARDIOGRAM;  Surgeon: Kenneth Poot, MD;  Location: [facility];  Service: Open Heart Surgery;  Laterality: N/A;  . LEFT HEART CATHETERIZATION WITH CORONARY ANGIOGRAM N/A 08/15/2012   Procedure: LEFT HEART CATHETERIZATION WITH CORONARY ANGIOGRAM;  Surgeon: Kenneth Grooms, MD;  Location[facility] CATH LAB;  Service: Cardiovascular;  Laterality: N/A;  . LEFT HEART CATHETERIZATION WITH CORONARY ANGIOGRAM N/A 04/04/2013   Procedure:  LEFT HEART CATHETERIZATION WITH CORONARY ANGIOGRAM;  Surgeon: Kenneth Sine, MD;  Location: [facility] CATH LAB;  Service: Cardiovascular;  Laterality: N/A;  . left tibial platcu fX  2010  . RADIAL ARTERY HARVEST Left 08/23/2012   Procedure: RADIAL ARTERY HARVEST;  Surgeon: Kenneth Poot, MD;  Location: [facility];  Service: Open Heart Surgery;  Laterality: Left;    Current Medications: Outpatient Medications Prior to Visit  Medication Sig Dispense Refill  . aspirin EC 81 MG tablet Take 1 tablet (81 mg total) by mouth daily. 30 tablet 11  . atorvastatin (LIPITOR) 40 MG tablet Take 40 mg by mouth daily at 6 PM.     . B-Complex TABS Take 1 tablet by mouth daily.    .  carvedilol (COREG) 25 MG tablet Take 1 tablet (25 mg total) by mouth 2 (two) times daily. 30 tablet 0  . carvedilol (COREG) 25 MG tablet TAKE 1 TABLET TWICE A DAY 180 tablet 0  . fenofibrate micronized (LOFIBRA) 200 MG capsule Take 200 mg by mouth daily before breakfast.    . ibuprofen (ADVIL,MOTRIN) 200 MG tablet Take 200 mg by mouth 2 (two) times daily.    Kenneth Giles lisinopril (PRINIVIL,ZESTRIL) 20 MG tablet Take 1 tablet (20 mg total) by mouth daily. 90 tablet 2  . Methylcellulose, Laxative, (CITRUCEL PO) Take 500 mg by mouth as needed (constipation).     . Misc Natural Products (GLUCOSAMINE-CHONDROITIN PLUS) TABS Take 1 tablet by mouth 2 (two) times daily.     . Misc Natural Products (LUTEIN 20) CAPS Take 20 mg by mouth every evening.     . Multiple Vitamin (MULTIVITAMIN) tablet Take 1 tablet by mouth daily.    . naproxen (NAPROSYN) 500 MG tablet Take 1 tablet (500 mg total) by mouth 2 (two) times daily as needed for mild pain, moderate pain or headache (TAKE WITH MEALS.). 20 tablet 0  . nitroGLYCERIN (NITROSTAT) 0.4 MG SL tablet Place 1 tablet (0.4 mg total) under the tongue every 5 (five) minutes as needed for chest pain. 25 tablet 3  . OMEGA 3 1000 MG CAPS Take 1 capsule by mouth daily.    Kenneth Giles omeprazole (PRILOSEC) 20 MG capsule Take 20 mg by mouth daily.    . ONE TOUCH ULTRA TEST test strip 1 each by Other route as needed.     . potassium chloride SA (K-DUR,KLOR-CON) 20 MEQ tablet Take 20 mEq by mouth 2 (two) times daily.     Kenneth Giles torsemide (DEMADEX) 10 MG tablet Take 1 tablet (10 mg total) by mouth daily. 30 tablet 0  . vitamin C (ASCORBIC ACID) 500 MG tablet Take 1,000 mg by mouth daily.    . Zinc 25 MG TABS Take 25 mg by mouth every evening.    . cyclobenzaprine (FLEXERIL) 10 MG tablet Take 1 tablet (10 mg total) by mouth 3 (three) times daily as needed for muscle spasms. (Patient not taking: Reported on 11/05/2015) 15 tablet 0  . glipiZIDE (GLUCOTROL XL) 2.5 MG 24 hr tablet Take 5 mg by mouth daily.      Kenneth Giles lisinopril (PRINIVIL,ZESTRIL) 20 MG tablet TAKE 1 TABLET BY MOUTH EVERY DAY (Patient not taking: Reported on 11/05/2015) 30 tablet 0  . oxyCODONE-acetaminophen (PERCOCET) 5-325 MG tablet Take 1 tablet by mouth every 6 (six) hours as needed for severe pain. (Patient not taking: Reported on 11/05/2015) 10 tablet 0  . spironolactone (ALDACTONE) 25 MG tablet Take 1 tablet (25 mg total) by mouth daily. (Patient not taking: Reported on  11/05/2015) 90 tablet 2   No facility-administered medications prior to visit.      Allergies:   Tylenol with codeine #3 [acetaminophen-codeine]   Social History   Social History  . Marital status: Widowed    Spouse name: N/A  . Number of children: N/A  . Years of education: N/A   Occupational History  . 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 Smoking status: Never Smoker  . Smokeless tobacco: Never Used  . Alcohol use Yes     Comment: rarely  . Drug use: No  . Sexual activity: Not Currently   Other Topics Concern  . None   Social History Narrative  . None     Family History:  The patient's family history includes Cancer in his mother; Fibromyalgia in his sister; Heart attack in his paternal uncle; Hyperlipidemia in his mother; Hypertension in his mother; Stroke in his paternal aunt.   ROS:   Please see the history of present illness.    Occasional diarrhea was made significantly worse when a switched to metformin was attempted. Occasional nausea.  All other systems reviewed and are negative.   PHYSICAL EXAM:   VS:  BP 124/62   Pulse 67   Ht 5\' 7"  (1.702 m)   Wt 237 lb (107.5 kg)   BMI 37.12 kg/m    GEN: Well nourished, well developed, in no acute distress  HEENT: normal  Neck: no JVD, carotid bruits, or masses Cardiac: RRR; no murmurs, rubs, or gallops,no edema  Respiratory:  clear to auscultation bilaterally, normal work of breathing GI: soft, nontender, nondistended, +  BS MS: no deformity or atrophy  Skin: warm and dry, no rash Neuro:  Alert and Oriented x 3, Strength and sensation are intact Psych: euthymic mood, full affect  Wt Readings from Last 3 Encounters:  11/05/15 237 lb (107.5 kg)  06/12/14 244 lb 6.4 oz (110.9 kg)  05/22/14 240 lb (108.9 kg)      Studies/Labs Reviewed:   EKG:  EKG  Sinus rhythm, left bundle branch block, no change compared to prior tracings, most recently 11/05/15.  Recent Labs: No results found for requested labs within last 8760 hours.   Lipid Panel    Component Value Date/Time   CHOL 106 04/05/2013 0320   TRIG 136 04/05/2013 0320   HDL 22 (L) 04/05/2013 0320   CHOLHDL 4.8 04/05/2013 0320   VLDL 27 04/05/2013 0320   LDLCALC 57 04/05/2013 0320    Additional studies/ records that were reviewed today include:  There is no new data.     ASSESSMENT:    1. S/P CABG x 4   2. Essential hypertension   3. Chronic combined systolic and diastolic CHF (congestive heart failure) ([place])   4. LBBB (left bundle branch block)      PLAN:  In order of problems listed above:  1. He is stable after bypass surgery in 2014. No angina. He is on secondary prevention with antihypertensive therapy and statin therapy. 2. 2 g sodium diet, weight loss, and exercise advocated for complementary non-medicinal measures to control blood pressure. 3. No evidence of volume overload. Taking medications as recommended. He is to call if dyspnea, orthopnea, or edema. We may need to consider Entresto. 4. QRS duration is 166 ms. We will check an echocardiogram prior to the next office visit. We will need to consider resynchronization therapy as some point in  the future if LV dysfunction progresses.    Medication Adjustments/Labs and Tests Ordered: Current medicines are reviewed at length with the patient today.  Concerns regarding medicines are outlined above.  Medication changes, Labs and Tests ordered today are listed in the Patient  Instructions below. There are no Patient Instructions on file for this visit.   Signed, Kenneth Grooms, MD  11/05/2015 3:53 PM    [facility] [place], [place], [place]  [address] Phone: [phone]; Fax: [phone]

## 2015-12-12 ENCOUNTER — Other Ambulatory Visit: Payer: Self-pay | Admitting: Interventional Cardiology

## 2015-12-25 ENCOUNTER — Other Ambulatory Visit: Payer: Self-pay | Admitting: Physician Assistant

## 2016-01-14 DIAGNOSIS — Z23 Encounter for immunization: Secondary | ICD-10-CM | POA: Diagnosis not present

## 2016-01-14 DIAGNOSIS — E1140 Type 2 diabetes mellitus with diabetic neuropathy, unspecified: Secondary | ICD-10-CM | POA: Diagnosis not present

## 2016-01-14 DIAGNOSIS — Z79899 Other long term (current) drug therapy: Secondary | ICD-10-CM | POA: Diagnosis not present

## 2016-03-05 ENCOUNTER — Emergency Department (HOSPITAL_BASED_OUTPATIENT_CLINIC_OR_DEPARTMENT_OTHER)
Admission: EM | Admit: 2016-03-05 | Discharge: 2016-03-05 | Disposition: A | Discharge status: Home / Self Care | Payer: BLUE CROSS/BLUE SHIELD | Attending: Emergency Medicine | Admitting: Emergency Medicine

## 2016-03-05 ENCOUNTER — Emergency Department (HOSPITAL_BASED_OUTPATIENT_CLINIC_OR_DEPARTMENT_OTHER): Payer: BLUE CROSS/BLUE SHIELD

## 2016-03-05 ENCOUNTER — Encounter (HOSPITAL_BASED_OUTPATIENT_CLINIC_OR_DEPARTMENT_OTHER): Payer: Self-pay | Admitting: Emergency Medicine

## 2016-03-05 DIAGNOSIS — Z79899 Other long term (current) drug therapy: Secondary | ICD-10-CM | POA: Diagnosis not present

## 2016-03-05 DIAGNOSIS — Z951 Presence of aortocoronary bypass graft: Secondary | ICD-10-CM | POA: Diagnosis not present

## 2016-03-05 DIAGNOSIS — M25511 Pain in right shoulder: Secondary | ICD-10-CM | POA: Diagnosis present

## 2016-03-05 DIAGNOSIS — Z7984 Long term (current) use of oral hypoglycemic drugs: Secondary | ICD-10-CM | POA: Insufficient documentation

## 2016-03-05 DIAGNOSIS — M5410 Radiculopathy, site unspecified: Secondary | ICD-10-CM | POA: Diagnosis not present

## 2016-03-05 DIAGNOSIS — E119 Type 2 diabetes mellitus without complications: Secondary | ICD-10-CM | POA: Diagnosis not present

## 2016-03-05 DIAGNOSIS — I110 Hypertensive heart disease with heart failure: Secondary | ICD-10-CM | POA: Insufficient documentation

## 2016-03-05 DIAGNOSIS — R079 Chest pain, unspecified: Secondary | ICD-10-CM | POA: Diagnosis not present

## 2016-03-05 DIAGNOSIS — I25810 Atherosclerosis of coronary artery bypass graft(s) without angina pectoris: Secondary | ICD-10-CM | POA: Diagnosis not present

## 2016-03-05 DIAGNOSIS — I5022 Chronic systolic (congestive) heart failure: Secondary | ICD-10-CM | POA: Diagnosis not present

## 2016-03-05 DIAGNOSIS — Z7982 Long term (current) use of aspirin: Secondary | ICD-10-CM | POA: Insufficient documentation

## 2016-03-05 DIAGNOSIS — M549 Dorsalgia, unspecified: Secondary | ICD-10-CM | POA: Diagnosis not present

## 2016-03-05 LAB — BASIC METABOLIC PANEL
ANION GAP: 8 (ref 5–15)
BUN: 19 mg/dL (ref 6–20)
CHLORIDE: 106 mmol/L (ref 101–111)
CO2: 23 mmol/L (ref 22–32)
CREATININE: 1.41 mg/dL — AB (ref 0.61–1.24)
Calcium: 9.1 mg/dL (ref 8.9–10.3)
GFR calc non Af Amer: 52 mL/min — ABNORMAL LOW (ref >60)
GFR, EST AFRICAN AMERICAN: 60 mL/min — AB (ref >60)
Glucose, Bld: 160 mg/dL — ABNORMAL HIGH (ref 65–99)
POTASSIUM: 3.7 mmol/L (ref 3.5–5.1)
Sodium: 137 mmol/L (ref 135–145)

## 2016-03-05 LAB — CBC
HEMATOCRIT: 44.6 % (ref 39.0–52.0)
HEMOGLOBIN: 15.1 g/dL (ref 13.0–17.0)
MCH: 27.2 pg (ref 26.0–34.0)
MCHC: 33.9 g/dL (ref 30.0–36.0)
MCV: 80.2 fL (ref 78.0–100.0)
Platelets: 99 10*3/uL — ABNORMAL LOW (ref 150–400)
RBC: 5.56 MIL/uL (ref 4.22–5.81)
RDW: 14.8 % (ref 11.5–15.5)
WBC: 4.5 10*3/uL (ref 4.0–10.5)

## 2016-03-05 LAB — CBG MONITORING, ED: Glucose-Capillary: 150 mg/dL — ABNORMAL HIGH (ref 65–99)

## 2016-03-05 LAB — TROPONIN I: Troponin I: <0.03 ng/mL (ref <0.03)

## 2016-03-05 NOTE — ED Provider Notes (Signed)
[facility] DEPT [facility] Provider Note   CSN: [id number] Arrival date & time: 03/05/16  1546  By signing my name below, I, Kenneth Giles, attest that this documentation has been prepared under the direction and in the presence of Kenneth Johns, MD. Electronically Signed: Reola Giles, ED Scribe. 03/05/16. 6:27 PM.  History   Chief Complaint Chief Complaint  Patient presents with  . Shoulder Pain    right   The history is provided by the patient. No language interpreter was used.    HPI Comments: Kenneth Giles is a 66 y.o. male with a PMHx of CAD s/p CABG x 4, CHF, LBBB, HTN, ischemic cardiomyopathy w/ an EF of 43%, DM, who presents to the Emergency Department complaining of persistent, right sided, upper back pain beginning three nights ago. He notes radiation of pain into his right shoulder and down into his right arm. Pt also notes that his pain woke him up from sleep two mornings ago. No recent trauma or injury to the area or right arm. Pt was seen and evaluated for his current pain earlier in the day at [facility] clinic. At that time he was referred into the ED for further workup d/t abnormal EKG tracing changes. His pain is mildly alleviated with sitting upright, and his pain is exacerbated with ROM of the arm and laying on his right side. No h/o similar pain. Pt has been taking OxyContin at home without noticeable relief of his pain. Per prior chart review, his last cardiac catheterization was performed on 04/04/13 (~2 years ago). No recent illness/infections. He denies focal numbness/weakness/paraesthesias, chest pain, shortness of breath, dizziness, diaphoresis, cough, congestion, rhinorrhea, fever, or any other associated symptoms.   PCP: Kenneth Argyle, MD Cardiologist: [name], MD  Past Medical History:  Diagnosis Date  . Allergic rhinitis    takes Zyrtec daily  . Aortic sclerosis    a. syst murmur bilat usb->sclerosis by echo 03/2013.  [name]  Arthritis    hands   . CAD (coronary artery disease)    a. 2014 CABG x 4 (L->LAD, V->Diag, V->OM, Rad->RCA);  b. 03/2013 Cath: LM nl, LAD 80p, LCX 82m, OM1 100, RCA 70-37m, all grafts patent, EF 40%.  . Chronic low back pain    cause unknown  . Chronic systolic CHF (congestive heart failure) ([place])   . Constipation    Citrucel daily  . Diabetes mellitus ([place])    takes Glipizide daily  . Diverticulosis   . Enlarged prostate    slightly  . GERD (gastroesophageal reflux disease)    takes Omeprazole daily  . H/O renal calculi    no treatment needed  . Headache(784.0)    rarely   . History of blood transfusion    no abnormal reaction noted  . History of colon polyps   . Hypercholesteremia    takes Lipitor daily  . Hypertension    takes Metoprolol daily and Lisinopril   . Ischemic cardiomyopathy    a. 03/2013 Echo: EF 25-30%, diff HK, antsep/apical AK, Gr2 DD, Ao sclerosis, mild AI, PASP 36.  [name] Left bundle branch block   . Lipoma of back   . Nephrolithiasis   . Nocturia   . OSA (obstructive sleep apnea)   . Peripheral edema    takes Lozol daily  . Peripheral neuropathy ([place])   . Pneumonia    hx of about 10+yrs ago  . Thrombocytopenia ([place])   . Urinary frequency    Patient Active Problem List  Diagnosis Date Noted  . Ischemic cardiomyopathy 09/01/2013  . HLD (hyperlipidemia) 09/01/2013  . S/P CABG x 4 09/16/2012  . Pleural effusion 09/16/2012  . Diabetes mellitus ([place]) 08/19/2012  . Hypertension 08/19/2012  . CAD (coronary artery disease)   . Abnormal myocardial perfusion study 08/15/2012    Class: Present on Admission  . Chronic systolic heart failure ([place]) 08/15/2012   Past Surgical History:  Procedure Laterality Date  . CARDIAC CATHETERIZATION  2014  . COLONOSCOPY    . CORONARY ARTERY BYPASS GRAFT N/A 08/23/2012   Procedure: CORONARY ARTERY BYPASS GRAFTING times four using Right Greater Saphenous Vein Graft harvested endoscopically and Left Radail Artery Graft;   Surgeon: Ivin Poot, MD;  Location: [facility];  Service: Open Heart Surgery;  Laterality: N/A;  . fx humurous from [place]  2010  . INTRAOPERATIVE TRANSESOPHAGEAL ECHOCARDIOGRAM N/A 08/23/2012   Procedure: INTRAOPERATIVE TRANSESOPHAGEAL ECHOCARDIOGRAM;  Surgeon: Ivin Poot, MD;  Location: [facility];  Service: Open Heart Surgery;  Laterality: N/A;  . LEFT HEART CATHETERIZATION WITH CORONARY ANGIOGRAM N/A 08/15/2012   Procedure: LEFT HEART CATHETERIZATION WITH CORONARY ANGIOGRAM;  Surgeon: Sinclair Grooms, MD;  Location[facility] CATH LAB;  Service: Cardiovascular;  Laterality: N/A;  . LEFT HEART CATHETERIZATION WITH CORONARY ANGIOGRAM N/A 04/04/2013   Procedure: LEFT HEART CATHETERIZATION WITH CORONARY ANGIOGRAM;  Surgeon: Troy Sine, MD;  Location: [facility] CATH LAB;  Service: Cardiovascular;  Laterality: N/A;  . left tibial platcu fX  2010  . RADIAL ARTERY HARVEST Left 08/23/2012   Procedure: RADIAL ARTERY HARVEST;  Surgeon: Ivin Poot, MD;  Location: [facility];  Service: Open Heart Surgery;  Laterality: Left;    Home Medications    Prior to Admission medications   Medication Sig Start Date End Date Taking? Authorizing Provider  aspirin EC 81 MG tablet Take 1 tablet (81 mg total) by mouth daily. 11/25/13  Yes Deboraha Sprang, MD  atorvastatin (LIPITOR) 40 MG tablet Take 40 mg by mouth daily at 6 PM.  07/08/13  Yes Historical Provider, MD  B-Complex TABS Take 1 tablet by mouth daily.   Yes Historical Provider, MD  carvedilol (COREG) 25 MG tablet Take 1 tablet (25 mg total) by mouth 2 (two) times daily. 12/27/15  Yes Scott T Kathlen Mody, PA-C  fenofibrate micronized (LOFIBRA) 200 MG capsule Take 200 mg by mouth daily before breakfast.   Yes Historical Provider, MD  glimepiride (AMARYL) 2 MG tablet Take 2 mg by mouth every morning. 09/03/15  Yes Historical Provider, MD  ibuprofen (ADVIL,MOTRIN) 200 MG tablet Take 200 mg by mouth 2 (two) times daily.   Yes Historical Provider, MD  lisinopril (PRINIVIL,ZESTRIL) 20  MG tablet Take 1 tablet (20 mg total) by mouth daily. 01/28/15  Yes Belva Crome, MD  Methylcellulose, Laxative, (CITRUCEL PO) Take 500 mg by mouth as needed (constipation).    Yes Historical Provider, MD  Misc Natural Products (GLUCOSAMINE-CHONDROITIN PLUS) TABS Take 1 tablet by mouth 2 (two) times daily.    Yes Historical Provider, MD  Misc Natural Products (LUTEIN 20) CAPS Take 20 mg by mouth every evening.    Yes Historical Provider, MD  Multiple Vitamin (MULTIVITAMIN) tablet Take 1 tablet by mouth daily.   Yes Historical Provider, MD  naproxen (NAPROSYN) 500 MG tablet Take 1 tablet (500 mg total) by mouth 2 (two) times daily as needed for mild pain, moderate pain or headache (TAKE WITH MEALS.). 03/03/15  Yes [place], PA-C  OMEGA 3 1000 MG CAPS Take  1 capsule by mouth daily.   Yes Historical Provider, MD  omeprazole (PRILOSEC) 20 MG capsule Take 20 mg by mouth daily.   Yes Historical Provider, MD  ONE TOUCH ULTRA TEST test strip 1 each by Other route as needed.  06/05/13  Yes Historical Provider, MD  potassium chloride SA (K-DUR,KLOR-CON) 20 MEQ tablet Take 20 mEq by mouth 2 (two) times daily.  08/15/12  Yes Belva Crome, MD  sitaGLIPtin (JANUVIA) 100 MG tablet Take 100 mg by mouth daily.   Yes Historical Provider, MD  torsemide (DEMADEX) 10 MG tablet TAKE 1 TABLET (10 MG TOTAL) BY MOUTH DAILY. 12/13/15  Yes Belva Crome, MD  vitamin C (ASCORBIC ACID) 500 MG tablet Take 1,000 mg by mouth daily.   Yes Historical Provider, MD  Zinc 25 MG TABS Take 25 mg by mouth every evening.   Yes Historical Provider, MD  nitroGLYCERIN (NITROSTAT) 0.4 MG SL tablet Place 1 tablet (0.4 mg total) under the tongue every 5 (five) minutes as needed for chest pain. 03/12/14   Liliane Shi, PA-C   Family History Family History  Problem Relation Age of Onset  . Hypertension Mother   . Hyperlipidemia Mother   . Cancer Mother     uterine  . Fibromyalgia Sister   . Heart attack Paternal Uncle   .  Stroke Paternal Aunt    Social History Social History  Substance Use Topics  . Smoking status: Never Smoker  . Smokeless tobacco: Never Used  . Alcohol use Yes     Comment: rarely   Allergies   Tylenol with codeine #3 [acetaminophen-codeine]  Review of Systems Review of Systems  Constitutional: Negative for diaphoresis and fever.  HENT: Negative for congestion and rhinorrhea.   Respiratory: Negative for cough and shortness of breath.   Cardiovascular: Negative for chest pain.  Musculoskeletal: Positive for arthralgias (right shoulder, 2/2 radiation), back pain (right-sided, upper) and myalgias.  Neurological: Negative for weakness and numbness.       Negative for paraesthesias.  All other systems reviewed and are negative.  Physical Exam Updated Vital Signs BP 139/68   Pulse 66   Temp 98 F (36.7 C) (Oral)   Resp 18   Ht 5\' 10"  (1.778 m)   Wt 238 lb (108 kg)   SpO2 96%   BMI 34.15 kg/m   Physical Exam  Constitutional: He is oriented to person, place, and time. He appears well-developed and well-nourished.  HENT:  Head: Normocephalic and atraumatic.  Neck: Normal range of motion. Neck supple.  Cardiovascular: Normal rate.   Pulmonary/Chest: Effort normal.  Musculoskeletal: He exhibits edema and tenderness.  Tenderness along the musculature just inferior to the right scapula. There is an adjacent, non-tender 4cm soft tissue mass that appears to be consistent with a lipoma. No spinal tenderness. No rashes. Normal motor function and sensation in the upper extremities. Radial pulses are intact.   Neurological: He is alert and oriented to person, place, and time.  Skin: Skin is warm and dry.  Psychiatric: He has a normal mood and affect.   ED Treatments / Results  DIAGNOSTIC STUDIES: Oxygen Saturation is 96% on RA, normal by my interpretation.   COORDINATION OF CARE: 6:27 PM-Discussed next steps with pt including CXR, blood work, and EKG. Pt verbalized understanding  and is agreeable with the plan.   Labs (all labs ordered are listed, but only abnormal results are displayed) Labs Reviewed  BASIC METABOLIC PANEL - Abnormal; Notable for the following:  Result Value   Glucose, Bld 160 (*)    Creatinine, Ser 1.41 (*)    GFR calc non Af Amer 52 (*)    GFR calc Af Amer 60 (*)    All other components within normal limits  CBC - Abnormal; Notable for the following:    Platelets 99 (*)    All other components within normal limits  CBG MONITORING, ED - Abnormal; Notable for the following:    Glucose-Capillary 150 (*)    All other components within normal limits  TROPONIN I   EKG  EKG Interpretation  Date/Time:  Sunday March 05 2016 15:53:24 EST Ventricular Rate:  66 PR Interval:  186 QRS Duration: 170 QT Interval:  444 QTC Calculation: 465 R Axis:   -83 Text Interpretation:  Normal sinus rhythm Left axis deviation Non-specific intra-ventricular conduction block Abnormal ECG since last tracing no significant change Confirmed by Emmalie Haigh  MD, Arelys Glassco ([id number]) on 03/05/2016 4:30:42 PM      Radiology Dg Chest 2 View  Result Date: 03/05/2016 CLINICAL DATA:  Chest pain for 2 days. EXAM: CHEST  2 VIEW COMPARISON:  April 05, 2013 FINDINGS: No pneumothorax. Cardiomegaly. The hila and mediastinum are normal. No pulmonary nodules, masses, or focal infiltrates. IMPRESSION: No active cardiopulmonary disease. Electronically Signed   By: Dorise Bullion III M.D   On: 03/05/2016 16:50   Procedures Procedures   Medications Ordered in ED Medications - No data to display  Initial Impression / Assessment and Plan / ED Course  I have reviewed the triage vital signs and the nursing notes.  Pertinent labs & imaging results that were available during my care of the patient were reviewed by me and considered in my medical decision making (see chart for details).     Patient symptoms seem to be musculoskeletal in nature. It's reproducible on palpation and is  most likely either a muscular irritation or neuropathy. It's worse with movement of the upper extremity and worse with palpation. He doesn't have other symptoms that would be more concerning for cardiac etiology. His EKG is unchanged from his prior EKGs. His labs are non-concerning.  His creatinine and platelet counts are slightly abnormal but similar to prior values. He doesn't have any symptoms that would be more concerning for aortic dissection or PE. He has no chest pain or shortness of breath. He was discharged home in good condition. He has pain medications to take at home. He was advised to follow-up with his PCP for recheck. Return precautions were given.  Final Clinical Impressions(s) / ED Diagnoses   Final diagnoses:  Radiculopathy, unspecified spinal region   New Prescriptions New Prescriptions   No medications on file   I personally performed the services described in this documentation, which was scribed in my presence.  The recorded information has been reviewed and considered.     Kenneth Johns, MD 03/05/16 [id number]

## 2016-03-05 NOTE — ED Triage Notes (Signed)
Patient c/o right shoulder pain, was seen at [facility] walk in. EKG done and was sent to the ER for eval b/c "they didn't like the way my EKG looked". Patient reports hx of CABG (08/2012).

## 2016-03-05 NOTE — ED Notes (Signed)
Pt given d/c instructions as per chart. Verbalizes understanding. No questions. 

## 2016-03-05 NOTE — ED Notes (Signed)
ED Provider at bedside. 

## 2016-03-05 NOTE — ED Notes (Signed)
Back pain under R shoulder blade with occasional radiation to R shoulder. No associated ShOB, nausea, or diaphoresis. Pain is worse with movements. Pt does have significant cardiac hx.

## 2016-03-28 DIAGNOSIS — J101 Influenza due to other identified influenza virus with other respiratory manifestations: Secondary | ICD-10-CM | POA: Diagnosis not present

## 2016-03-28 DIAGNOSIS — J309 Allergic rhinitis, unspecified: Secondary | ICD-10-CM | POA: Diagnosis not present

## 2016-03-28 DIAGNOSIS — B349 Viral infection, unspecified: Secondary | ICD-10-CM | POA: Diagnosis not present

## 2016-04-13 ENCOUNTER — Other Ambulatory Visit: Payer: Self-pay | Admitting: Interventional Cardiology

## 2016-09-29 ENCOUNTER — Other Ambulatory Visit: Payer: Self-pay

## 2016-09-29 ENCOUNTER — Ambulatory Visit (HOSPITAL_COMMUNITY): Payer: BLUE CROSS/BLUE SHIELD | Attending: Cardiology

## 2016-09-29 DIAGNOSIS — I110 Hypertensive heart disease with heart failure: Secondary | ICD-10-CM | POA: Diagnosis not present

## 2016-09-29 DIAGNOSIS — E119 Type 2 diabetes mellitus without complications: Secondary | ICD-10-CM | POA: Insufficient documentation

## 2016-09-29 DIAGNOSIS — I2510 Atherosclerotic heart disease of native coronary artery without angina pectoris: Secondary | ICD-10-CM | POA: Insufficient documentation

## 2016-09-29 DIAGNOSIS — I10 Essential (primary) hypertension: Secondary | ICD-10-CM

## 2016-09-29 DIAGNOSIS — E785 Hyperlipidemia, unspecified: Secondary | ICD-10-CM | POA: Diagnosis not present

## 2016-09-29 DIAGNOSIS — I5042 Chronic combined systolic (congestive) and diastolic (congestive) heart failure: Secondary | ICD-10-CM | POA: Diagnosis not present

## 2016-09-29 DIAGNOSIS — Z951 Presence of aortocoronary bypass graft: Secondary | ICD-10-CM | POA: Insufficient documentation

## 2016-09-29 DIAGNOSIS — I447 Left bundle-branch block, unspecified: Secondary | ICD-10-CM | POA: Insufficient documentation

## 2016-09-29 DIAGNOSIS — I255 Ischemic cardiomyopathy: Secondary | ICD-10-CM | POA: Diagnosis not present

## 2016-09-29 MED ORDER — PERFLUTREN LIPID MICROSPHERE
1.0000 mL | INTRAVENOUS | Status: AC | PRN
  Administered 2016-09-29: 2 mL via INTRAVENOUS

## 2016-10-26 ENCOUNTER — Other Ambulatory Visit: Payer: Self-pay | Admitting: Interventional Cardiology

## 2016-11-08 ENCOUNTER — Encounter: Payer: Self-pay | Admitting: Interventional Cardiology

## 2016-11-17 ENCOUNTER — Encounter: Payer: Self-pay | Admitting: Interventional Cardiology

## 2016-11-17 ENCOUNTER — Ambulatory Visit (INDEPENDENT_AMBULATORY_CARE_PROVIDER_SITE_OTHER): Payer: BLUE CROSS/BLUE SHIELD | Admitting: Interventional Cardiology

## 2016-11-17 VITALS — BP 144/66 | HR 68 | Ht 66.0 in | Wt 239.8 lb

## 2016-11-17 DIAGNOSIS — E1159 Type 2 diabetes mellitus with other circulatory complications: Secondary | ICD-10-CM | POA: Diagnosis not present

## 2016-11-17 DIAGNOSIS — I255 Ischemic cardiomyopathy: Secondary | ICD-10-CM

## 2016-11-17 DIAGNOSIS — I5022 Chronic systolic (congestive) heart failure: Secondary | ICD-10-CM

## 2016-11-17 DIAGNOSIS — I2510 Atherosclerotic heart disease of native coronary artery without angina pectoris: Secondary | ICD-10-CM

## 2016-11-17 DIAGNOSIS — I10 Essential (primary) hypertension: Secondary | ICD-10-CM | POA: Diagnosis not present

## 2016-11-17 DIAGNOSIS — E7849 Other hyperlipidemia: Secondary | ICD-10-CM

## 2016-11-17 DIAGNOSIS — G4733 Obstructive sleep apnea (adult) (pediatric): Secondary | ICD-10-CM | POA: Diagnosis not present

## 2016-11-17 DIAGNOSIS — R0683 Snoring: Secondary | ICD-10-CM

## 2016-11-17 DIAGNOSIS — Z951 Presence of aortocoronary bypass graft: Secondary | ICD-10-CM

## 2016-11-17 MED ORDER — LISINOPRIL 40 MG PO TABS: 40.0000 mg | ORAL_TABLET | Freq: Every day | ORAL | 3 refills | Status: DC

## 2016-11-17 NOTE — Progress Notes (Signed)
Cardiology Office Note    Date:  11/17/2016   ID:  Azar, [place] Aug 19, 1950, MRN [id number]  PCP:  Lajean Manes, MD  Cardiologist: Sinclair Grooms, MD   Chief Complaint  Patient presents with  . Congestive Heart Failure  . Coronary Artery Disease    History of Present Illness:  Kenneth Giles is a 66 y.o. male for CAD, CABG ( 2014 x 4 (L->LAD, V->Diag, V->OM, Rad->RCA);  03/2013 Cath: LM nl, LAD 80p, LCX 36m, OM1 100, RCA 70-56m, all grafts patent, EF 40%.) , hypertension, obesity, mild aortic stenosis, and ischemic cardiomyopathy with EF 43%:  He really does not complain very much. He is having some cough. Energy has been stable. He denies excessive shortness of breath, orthopnea, and PND. No episodes of syncope. No chest pain episodes. He does not need nitroglycerin. He does not wait daily. He does not follow a specific dietary regimen.  Past Medical History:  Diagnosis Date  . Allergic rhinitis    takes Zyrtec daily  . Aortic sclerosis    a. syst murmur bilat usb->sclerosis by echo 03/2013.  [name] Arthritis    hands   . CAD (coronary artery disease)    a. 2014 CABG x 4 (L->LAD, V->Diag, V->OM, Rad->RCA);  b. 03/2013 Cath: LM nl, LAD 80p, LCX 64m, OM1 100, RCA 70-36m, all grafts patent, EF 40%.  . Chronic low back pain    cause unknown  . Chronic systolic CHF (congestive heart failure) ([place])   . Constipation    Citrucel daily  . Diabetes mellitus ([place])    takes Glipizide daily  . Diverticulosis   . Enlarged prostate    slightly  . GERD (gastroesophageal reflux disease)    takes Omeprazole daily  . H/O renal calculi    no treatment needed  . Headache(784.0)    rarely   . History of blood transfusion    no abnormal reaction noted  . History of colon polyps   . Hypercholesteremia    takes Lipitor daily  . Hypertension    takes Metoprolol daily and Lisinopril   . Ischemic cardiomyopathy    a. 03/2013 Echo: EF 25-30%, diff HK, antsep/apical AK, Gr2 DD, Ao  sclerosis, mild AI, PASP 36.  [name] Left bundle branch block   . Lipoma of back   . Nephrolithiasis   . Nocturia   . OSA (obstructive sleep apnea)   . Peripheral edema    takes Lozol daily  . Peripheral neuropathy   . Pneumonia    hx of about 10+yrs ago  . Thrombocytopenia ([place])   . Urinary frequency     Past Surgical History:  Procedure Laterality Date  . CARDIAC CATHETERIZATION  2014  . COLONOSCOPY    . CORONARY ARTERY BYPASS GRAFT N/A 08/23/2012   Procedure: CORONARY ARTERY BYPASS GRAFTING times four using Right Greater Saphenous Vein Graft harvested endoscopically and Left Radail Artery Graft;  Surgeon: Ivin Poot, MD;  Location: [place];  Service: Open Heart Surgery;  Laterality: N/A;  . fx humurous from [place]  2010  . INTRAOPERATIVE TRANSESOPHAGEAL ECHOCARDIOGRAM N/A 08/23/2012   Procedure: INTRAOPERATIVE TRANSESOPHAGEAL ECHOCARDIOGRAM;  Surgeon: Ivin Poot, MD;  Location: [place];  Service: Open Heart Surgery;  Laterality: N/A;  . LEFT HEART CATHETERIZATION WITH CORONARY ANGIOGRAM N/A 08/15/2012   Procedure: LEFT HEART CATHETERIZATION WITH CORONARY ANGIOGRAM;  Surgeon: Sinclair Grooms, MD;  Location: [facility] CATH LAB;  Service: Cardiovascular;  Laterality: N/A;  . LEFT HEART  CATHETERIZATION WITH CORONARY ANGIOGRAM N/A 04/04/2013   Procedure: LEFT HEART CATHETERIZATION WITH CORONARY ANGIOGRAM;  Surgeon: Troy Sine, MD;  Location: [facility] CATH LAB;  Service: Cardiovascular;  Laterality: N/A;  . left tibial platcu fX  2010  . RADIAL ARTERY HARVEST Left 08/23/2012   Procedure: RADIAL ARTERY HARVEST;  Surgeon: Ivin Poot, MD;  Location: [place];  Service: Open Heart Surgery;  Laterality: Left;    Current Medications: Outpatient Medications Prior to Visit  Medication Sig Dispense Refill  . aspirin EC 81 MG tablet Take 1 tablet (81 mg total) by mouth daily. 30 tablet 11  . atorvastatin (LIPITOR) 40 MG tablet Take 40 mg by mouth daily at 6 PM.     . B-Complex TABS Take 1 tablet by mouth  daily.    . carvedilol (COREG) 25 MG tablet Take 1 tablet (25 mg total) by mouth 2 (two) times daily. 180 tablet 2  . fenofibrate micronized (LOFIBRA) 200 MG capsule Take 200 mg by mouth daily before breakfast.    . glimepiride (AMARYL) 2 MG tablet Take 2 mg by mouth every morning.    [name] ibuprofen (ADVIL,MOTRIN) 200 MG tablet Take 200 mg by mouth 2 (two) times daily.    . Methylcellulose, Laxative, (CITRUCEL PO) Take 500 mg by mouth as needed (constipation).     . Misc Natural Products (GLUCOSAMINE-CHONDROITIN PLUS) TABS Take 1 tablet by mouth 2 (two) times daily.     . Misc Natural Products (LUTEIN 20) CAPS Take 20 mg by mouth every evening.     . Multiple Vitamin (MULTIVITAMIN) tablet Take 1 tablet by mouth daily.    . naproxen (NAPROSYN) 500 MG tablet Take 1 tablet (500 mg total) by mouth 2 (two) times daily as needed for mild pain, moderate pain or headache (TAKE WITH MEALS.). 20 tablet 0  . nitroGLYCERIN (NITROSTAT) 0.4 MG SL tablet Place 1 tablet (0.4 mg total) under the tongue every 5 (five) minutes as needed for chest pain. 25 tablet 3  . OMEGA 3 1000 MG CAPS Take 1 capsule by mouth daily.    [name] omeprazole (PRILOSEC) 20 MG capsule Take 20 mg by mouth daily.    . ONE TOUCH ULTRA TEST test strip 1 each by Other route as needed.     . potassium chloride SA (K-DUR,KLOR-CON) 20 MEQ tablet Take 20 mEq by mouth 2 (two) times daily.     . sitaGLIPtin (JANUVIA) 100 MG tablet Take 100 mg by mouth daily.    [name] torsemide (DEMADEX) 10 MG tablet TAKE 1 TABLET (10 MG TOTAL) BY MOUTH DAILY. 30 tablet 10  . vitamin C (ASCORBIC ACID) 500 MG tablet Take 1,000 mg by mouth daily.    . Zinc 25 MG TABS Take 25 mg by mouth every evening.    [name] lisinopril (PRINIVIL,ZESTRIL) 20 MG tablet TAKE 1 TABLET BY MOUTH DAILY 90 tablet 3   No facility-administered medications prior to visit.      Allergies:   Tylenol with codeine #3 [acetaminophen-codeine]   Social History   Social History  . Marital status: Widowed     Spouse name: N/A  . Number of children: N/A  . Years of education: N/A   Occupational History  . 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 Smoking status: Never Smoker  . Smokeless tobacco: Never Used  . Alcohol use Yes     Comment: rarely  .  Drug use: No  . Sexual activity: Not Currently   Other Topics Concern  . None   Social History Narrative  . None     Family History:  The patient's family history includes Cancer in his mother; Fibromyalgia in his sister; Heart attack in his paternal uncle; Hyperlipidemia in his mother; Hypertension in his mother; Stroke in his paternal aunt.   ROS:   Please see the history of present illness.    As mentioned above, cough has been an issue. If it increases with medication adjustments perhaps it is related to ACE inhibitor therapy. He was diagnosed with sleep apnea nearly 2 years ago but never followed up and therefore therapy was never initiated. He has chronic recurring low back discomfort. He works as an Clinical biochemist.  All other systems reviewed and are negative.   PHYSICAL EXAM:   VS:  BP (!) 144/66 (BP Location: Right Arm)   Pulse 68   Ht 5\' 6"  (1.676 m)   Wt 239 lb 12.8 oz (108.8 kg)   BMI 38.70 kg/m    GEN: Well nourished, well developed, in no acute distress Moderate obesity. HEENT: normal  Neck: no JVD, carotid bruits, or masses Cardiac: RRR; no murmurs, rubs, or gallops,no edema  Respiratory:  clear to auscultation bilaterally, normal work of breathing GI: soft, nontender, nondistended, + BS MS: no deformity or atrophy  Skin: warm and dry, no rash Neuro:  Alert and Oriented x 3, Strength and sensation are intact Psych: euthymic mood, full affect  Wt Readings from Last 3 Encounters:  11/17/16 239 lb 12.8 oz (108.8 kg)  03/05/16 238 lb (108 kg)  11/05/15 237 lb (107.5 kg)      Studies/Labs Reviewed:   EKG:  EKG  Normal sinus rhythm with left bundle branch block,  superior axis, and when compared to prior tracings from January 2018, no change has occurred.  Recent Labs: 03/05/2016: BUN 19; Creatinine, Ser 1.41; Hemoglobin 15.1; Platelets 99; Potassium 3.7; Sodium 137   Lipid Panel    Component Value Date/Time   CHOL 106 04/05/2013 0320   TRIG 136 04/05/2013 0320   HDL 22 (L) 04/05/2013 0320   CHOLHDL 4.8 04/05/2013 0320   VLDL 27 04/05/2013 0320   LDLCALC 57 04/05/2013 0320    Additional studies/ records that were reviewed today include:   Echocardiogram performed 09/29/2016:  ------------------------------------------------------------------- Study Conclusions   - Left ventricle: The cavity size was normal. Wall thickness was   increased in a pattern of mild LVH. Systolic function was   severely reduced. The estimated ejection fraction was in the   range of 25% to 30%. Diffuse hypokinesis. Doppler parameters are   consistent with abnormal left ventricular relaxation (grade 1   diastolic dysfunction). Doppler parameters are consistent with   high ventricular filling pressure. - Aortic valve: There was trivial regurgitation. - Aortic root: The aortic root was mildly dilated.   Impressions:   - Technically difficult; definity used; severe global reduction in   LV systolic function; mild diastolic dysfunction; elevated LV   filling pressure; mild LVH; trace AI; mildly dilated aortic root.   ASSESSMENT:    1. Chronic systolic heart failure ([place])   2. Coronary artery disease involving native coronary artery of native heart without angina pectoris   3. Essential hypertension   4. Ischemic cardiomyopathy   5. Type 2 diabetes mellitus with other circulatory complication, without long-term current use of insulin ([place])   6. S/P CABG x 4   7. Other hyperlipidemia  8. Snoring   9. OSA (obstructive sleep apnea)      PLAN:  In order of problems listed above:  1. EF now 25-30%, progressive reduction in LV function despite  revascularization. Increase lisinopril to 40 mg per day with basic metabolic panel 1 week later. Needs electrophysiology evaluation to consider AICD/resynchronization in setting of decreased systolic function and left bundle branch block with QRS duration 166 ms. May need to transition vasodilator therapy to [facility]. He is not volume overloaded. He is functional at work. 2. Asymptomatic 3. Borderline control. Increasing lisinopril to 20 mg twice a day will help 4. Progressive despite heart failure therapy. Already on max beta blocker. Optimizing ACE therapy. 5. Reiterated A1c target less than 7. 6. Not addressed 7. Our LDL target is less than 70. 8. Need sleep apnea treated. Refer back to sleep specialist, Dr. Radford Pax.  Six-month follow-up with me to ensure that above recommendations are being acted upon.   Medication Adjustments/Labs and Tests Ordered: Current medicines are reviewed at length with the patient today.  Concerns regarding medicines are outlined above.  Medication changes, Labs and Tests ordered today are listed in the Patient Instructions below. Patient Instructions  Medication Instructions:  1) INCREASE Lisinopril to 40mg  once daily  Labwork: Your physician recommends that you return for lab work in: 1 week (BMET)   Testing/Procedures: Your physician has recommended that you have a sleep study. This test records several body functions during sleep, including: brain activity, eye movement, oxygen and carbon dioxide blood levels, heart rate and rhythm, breathing rate and rhythm, the flow of air through your mouth and nose, snoring, body muscle movements, and chest and belly movement.    Follow-Up: You have been referred to our Electrophysiology Dept.  Dr. Tamala Julian would like for you to see Dr. Lovena Le. (Consideration for AICD/resynchronization)  Your physician wants you to follow-up in: 6 months with Dr. Tamala Julian. You will receive a reminder letter in the mail two months in  advance. If you don't receive a letter, please call our office to schedule the follow-up appointment.    Any Other Special Instructions Will Be Listed Below (If Applicable).     If you need a refill on your cardiac medications before your next appointment, please call your pharmacy.      Signed, Sinclair Grooms, MD  11/17/2016 12:22 PM    [facility] [place], [place], [place]  [address] Phone: [phone]; Fax: [phone]

## 2016-11-17 NOTE — Patient Instructions (Signed)
Medication Instructions:  1) INCREASE Lisinopril to 40mg  once daily  Labwork: Your physician recommends that you return for lab work in: 1 week (BMET)   Testing/Procedures: Your physician has recommended that you have a sleep study. This test records several body functions during sleep, including: brain activity, eye movement, oxygen and carbon dioxide blood levels, heart rate and rhythm, breathing rate and rhythm, the flow of air through your mouth and nose, snoring, body muscle movements, and chest and belly movement.    Follow-Up: You have been referred to our Electrophysiology Dept.  Dr. Tamala Julian would like for you to see Dr. Lovena Le. (Consideration for AICD/resynchronization)  Your physician wants you to follow-up in: 6 months with Dr. Tamala Julian. You will receive a reminder letter in the mail two months in advance. If you don't receive a letter, please call our office to schedule the follow-up appointment.    Any Other Special Instructions Will Be Listed Below (If Applicable).     If you need a refill on your cardiac medications before your next appointment, please call your pharmacy.

## 2016-11-23 ENCOUNTER — Telehealth: Payer: Self-pay | Admitting: *Deleted

## 2016-11-23 NOTE — Telephone Encounter (Signed)
Informed patient of sleep study and patient understanding was verbalized. Patient understands his sleep study is scheduled for Thursday November 29. Patient understands his sleep study will be done at [facility] sleep lab. Patient understands he will receive a sleep packet in a week or so. Patient understands to call if he does not receive the sleep packet in a timely manner. Patient agrees with treatment and thanked me for call.

## 2016-11-29 ENCOUNTER — Other Ambulatory Visit: Payer: BLUE CROSS/BLUE SHIELD | Admitting: *Deleted

## 2016-11-29 DIAGNOSIS — I5022 Chronic systolic (congestive) heart failure: Secondary | ICD-10-CM | POA: Diagnosis not present

## 2016-11-29 LAB — BASIC METABOLIC PANEL
BUN/Creatinine Ratio: 16 (ref 10–24)
BUN: 18 mg/dL (ref 8–27)
CO2: 19 mmol/L — AB (ref 20–29)
CREATININE: 1.13 mg/dL (ref 0.76–1.27)
Calcium: 8.8 mg/dL (ref 8.6–10.2)
Chloride: 106 mmol/L (ref 96–106)
GFR calc Af Amer: 79 mL/min/{1.73_m2} (ref >59)
GFR, EST NON AFRICAN AMERICAN: 68 mL/min/{1.73_m2} (ref >59)
GLUCOSE: 247 mg/dL — AB (ref 65–99)
Potassium: 4.2 mmol/L (ref 3.5–5.2)
SODIUM: 137 mmol/L (ref 134–144)

## 2016-12-06 ENCOUNTER — Ambulatory Visit (INDEPENDENT_AMBULATORY_CARE_PROVIDER_SITE_OTHER): Payer: BLUE CROSS/BLUE SHIELD | Admitting: Internal Medicine

## 2016-12-06 ENCOUNTER — Encounter: Payer: Self-pay | Admitting: Internal Medicine

## 2016-12-06 VITALS — BP 162/82 | HR 76 | Ht 66.0 in | Wt 237.0 lb

## 2016-12-06 DIAGNOSIS — Z951 Presence of aortocoronary bypass graft: Secondary | ICD-10-CM

## 2016-12-06 DIAGNOSIS — I255 Ischemic cardiomyopathy: Secondary | ICD-10-CM | POA: Diagnosis not present

## 2016-12-06 DIAGNOSIS — I447 Left bundle-branch block, unspecified: Secondary | ICD-10-CM

## 2016-12-06 DIAGNOSIS — I5022 Chronic systolic (congestive) heart failure: Secondary | ICD-10-CM

## 2016-12-06 NOTE — Progress Notes (Signed)
HPI [name] is referred today by Dr. Tamala Julian to consider insertion of a biventricular ICD. He is a very pleasant 66 year old man with an ischemic cardiomyopathy, status post myocardial infarction (silent) status post bypass surgery, with a history of left bundle branch block/intraventricular conduction delay, QRS duration of 160 ms. The patient has class II heart failure symptoms. He has not had syncope. He denies anginal symptoms and is on maximal medical therapy for his congestive heart failure. He has not had peripheral edema and denies fevers and chills. Allergies  Allergen Reactions  . Tylenol With Codeine #3 [Acetaminophen-Codeine] Nausea Only     Current Outpatient Prescriptions  Medication Sig Dispense Refill  . aspirin EC 81 MG tablet Take 1 tablet (81 mg total) by mouth daily. 30 tablet 11  . atorvastatin (LIPITOR) 40 MG tablet Take 40 mg by mouth daily at 6 PM.     . B-Complex TABS Take 1 tablet by mouth daily.    . carvedilol (COREG) 25 MG tablet Take 1 tablet (25 mg total) by mouth 2 (two) times daily. 180 tablet 2  . cetirizine (ZYRTEC) 10 MG tablet Take 10 mg by mouth daily.    . fenofibrate micronized (LOFIBRA) 200 MG capsule Take 200 mg by mouth daily before breakfast.    . glimepiride (AMARYL) 2 MG tablet Take 2 mg by mouth every morning.    [name] ibuprofen (ADVIL,MOTRIN) 200 MG tablet Take 200 mg by mouth 2 (two) times daily.    [name] lisinopril (PRINIVIL,ZESTRIL) 40 MG tablet Take 1 tablet (40 mg total) by mouth daily. 90 tablet 3  . Methylcellulose, Laxative, (CITRUCEL PO) Take 500 mg by mouth as needed (constipation).     . Misc Natural Products (GLUCOSAMINE-CHONDROITIN PLUS) TABS Take 1 tablet by mouth 2 (two) times daily.     . Misc Natural Products (LUTEIN 20) CAPS Take 20 mg by mouth every evening.     . Multiple Vitamin (MULTIVITAMIN) tablet Take 1 tablet by mouth daily.    . naproxen (NAPROSYN) 500 MG tablet Take 1 tablet (500 mg total) by mouth 2 (two) times  daily as needed for mild pain, moderate pain or headache (TAKE WITH MEALS.). 20 tablet 0  . nitroGLYCERIN (NITROSTAT) 0.4 MG SL tablet Place 1 tablet (0.4 mg total) under the tongue every 5 (five) minutes as needed for chest pain. 25 tablet 3  . OMEGA 3 1000 MG CAPS Take 1 capsule by mouth daily.    [name] omeprazole (PRILOSEC) 20 MG capsule Take 20 mg by mouth daily.    . potassium chloride SA (K-DUR,KLOR-CON) 20 MEQ tablet Take 20 mEq by mouth 2 (two) times daily.     . sitaGLIPtin (JANUVIA) 100 MG tablet Take 100 mg by mouth daily.    [name] torsemide (DEMADEX) 10 MG tablet TAKE 1 TABLET (10 MG TOTAL) BY MOUTH DAILY. 30 tablet 10  . vitamin C (ASCORBIC ACID) 500 MG tablet Take 1,000 mg by mouth daily.    . Zinc 25 MG TABS Take 25 mg by mouth every evening.    . ONE TOUCH ULTRA TEST test strip 1 each by Other route as needed.      No current facility-administered medications for this visit.      Past Medical History:  Diagnosis Date  . Allergic rhinitis    takes Zyrtec daily  . Aortic sclerosis    a. syst murmur bilat usb->sclerosis by echo 03/2013.  [name] Arthritis    hands   .  CAD (coronary artery disease)    a. 2014 CABG x 4 (L->LAD, V->Diag, V->OM, Rad->RCA);  b. 03/2013 Cath: LM nl, LAD 80p, LCX 52m, OM1 100, RCA 70-28m, all grafts patent, EF 40%.  . Chronic low back pain    cause unknown  . Chronic systolic CHF (congestive heart failure) ([place])   . Constipation    Citrucel daily  . Diabetes mellitus ([place])    takes Glipizide daily  . Diverticulosis   . Enlarged prostate    slightly  . GERD (gastroesophageal reflux disease)    takes Omeprazole daily  . H/O renal calculi    no treatment needed  . Headache(784.0)    rarely   . History of blood transfusion    no abnormal reaction noted  . History of colon polyps   . Hypercholesteremia    takes Lipitor daily  . Hypertension    takes Metoprolol daily and Lisinopril   . Ischemic cardiomyopathy    a. 03/2013 Echo: EF 25-30%, diff HK,  antsep/apical AK, Gr2 DD, Ao sclerosis, mild AI, PASP 36.  [name] Left bundle branch block   . Lipoma of back   . Nephrolithiasis   . Nocturia   . OSA (obstructive sleep apnea)   . Peripheral edema    takes Lozol daily  . Peripheral neuropathy   . Pneumonia    hx of about 10+yrs ago  . Thrombocytopenia ([place])   . Urinary frequency     ROS:   All systems reviewed and negative except as noted in the HPI.   Past Surgical History:  Procedure Laterality Date  . CARDIAC CATHETERIZATION  2014  . COLONOSCOPY    . CORONARY ARTERY BYPASS GRAFT N/A 08/23/2012   Procedure: CORONARY ARTERY BYPASS GRAFTING times four using Right Greater Saphenous Vein Graft harvested endoscopically and Left Radail Artery Graft;  Surgeon: Ivin Poot, MD;  Location: [place];  Service: Open Heart Surgery;  Laterality: N/A;  . fx humurous from [place]  2010  . INTRAOPERATIVE TRANSESOPHAGEAL ECHOCARDIOGRAM N/A 08/23/2012   Procedure: INTRAOPERATIVE TRANSESOPHAGEAL ECHOCARDIOGRAM;  Surgeon: Ivin Poot, MD;  Location: [place];  Service: Open Heart Surgery;  Laterality: N/A;  . LEFT HEART CATHETERIZATION WITH CORONARY ANGIOGRAM N/A 08/15/2012   Procedure: LEFT HEART CATHETERIZATION WITH CORONARY ANGIOGRAM;  Surgeon: Sinclair Grooms, MD;  Location: [facility] CATH LAB;  Service: Cardiovascular;  Laterality: N/A;  . LEFT HEART CATHETERIZATION WITH CORONARY ANGIOGRAM N/A 04/04/2013   Procedure: LEFT HEART CATHETERIZATION WITH CORONARY ANGIOGRAM;  Surgeon: Troy Sine, MD;  Location: [facility] CATH LAB;  Service: Cardiovascular;  Laterality: N/A;  . left tibial platcu fX  2010  . RADIAL ARTERY HARVEST Left 08/23/2012   Procedure: RADIAL ARTERY HARVEST;  Surgeon: Ivin Poot, MD;  Location: [employer];  Service: Open Heart Surgery;  Laterality: Left;     Family History  Problem Relation Age of Onset  . Hypertension Mother   . Hyperlipidemia Mother   . Cancer Mother        uterine  . Fibromyalgia Sister   . Heart attack Paternal Uncle    . Stroke Paternal Aunt      Social History   Social History  . Marital status: Widowed    Spouse name: N/A  . Number of children: N/A  . Years of education: N/A   Occupational History  . 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  Smoking status: Never Smoker  . Smokeless tobacco: Never Used  . Alcohol use Yes     Comment: rarely  . Drug use: No  . Sexual activity: Not Currently   Other Topics Concern  . Not on file   Social History Narrative  . No narrative on file     BP (!) 162/82   Pulse 76   Ht 5\' 6"  (1.676 m)   Wt 237 lb (107.5 kg)   BMI 38.25 kg/m   Physical Exam:  Well appearing 66 year old man, NAD HEENT: Unremarkable Neck:  7 cm JVD, no thyromegally Lymphatics:  No adenopathy Back:  No CVA tenderness Lungs:  Clear, with no wheezes, rales, or rhonchi. HEART:  Regular rate rhythm, no murmurs, no rubs, no clicks Abd:  soft, positive bowel sounds, no organomegally, no rebound, no guarding Ext:  2 plus pulses, no edema, no cyanosis, no clubbing Skin:  No rashes no nodules Neuro:  CN II through XII intact, motor grossly intact  EKG - reviewed, normal sinus rhythm with intraventricular conduction delay/left bundle branch block, QRS duration 166 ms.  Assess/Plan: 1. Chronic systolic heart failure - he has an ejection fraction of 25%, and I discussed the treatment options with the patient in detail. The risks, goals, benefits, and expectations of biventricular ICD insertion were reviewed. He will call us if he wishes to proceed 2. Coronary artery disease - he denies anginal symptoms. He will continue his current medical therapy  Cristopher Peru, M.D.

## 2016-12-06 NOTE — H&P (View-Only) (Signed)
HPI [name] is referred today by Dr. Tamala Julian to consider insertion of a biventricular ICD. He is a very pleasant 66 year old man with an ischemic cardiomyopathy, status post myocardial infarction (silent) status post bypass surgery, with a history of left bundle branch block/intraventricular conduction delay, QRS duration of 160 ms. The patient has class II heart failure symptoms. He has not had syncope. He denies anginal symptoms and is on maximal medical therapy for his congestive heart failure. He has not had peripheral edema and denies fevers and chills. Allergies  Allergen Reactions  . Tylenol With Codeine #3 [Acetaminophen-Codeine] Nausea Only     Current Outpatient Prescriptions  Medication Sig Dispense Refill  . aspirin EC 81 MG tablet Take 1 tablet (81 mg total) by mouth daily. 30 tablet 11  . atorvastatin (LIPITOR) 40 MG tablet Take 40 mg by mouth daily at 6 PM.     . B-Complex TABS Take 1 tablet by mouth daily.    . carvedilol (COREG) 25 MG tablet Take 1 tablet (25 mg total) by mouth 2 (two) times daily. 180 tablet 2  . cetirizine (ZYRTEC) 10 MG tablet Take 10 mg by mouth daily.    . fenofibrate micronized (LOFIBRA) 200 MG capsule Take 200 mg by mouth daily before breakfast.    . glimepiride (AMARYL) 2 MG tablet Take 2 mg by mouth every morning.    [name] ibuprofen (ADVIL,MOTRIN) 200 MG tablet Take 200 mg by mouth 2 (two) times daily.    [name] lisinopril (PRINIVIL,ZESTRIL) 40 MG tablet Take 1 tablet (40 mg total) by mouth daily. 90 tablet 3  . Methylcellulose, Laxative, (CITRUCEL PO) Take 500 mg by mouth as needed (constipation).     . Misc Natural Products (GLUCOSAMINE-CHONDROITIN PLUS) TABS Take 1 tablet by mouth 2 (two) times daily.     . Misc Natural Products (LUTEIN 20) CAPS Take 20 mg by mouth every evening.     . Multiple Vitamin (MULTIVITAMIN) tablet Take 1 tablet by mouth daily.    . naproxen (NAPROSYN) 500 MG tablet Take 1 tablet (500 mg total) by mouth 2 (two) times  daily as needed for mild pain, moderate pain or headache (TAKE WITH MEALS.). 20 tablet 0  . nitroGLYCERIN (NITROSTAT) 0.4 MG SL tablet Place 1 tablet (0.4 mg total) under the tongue every 5 (five) minutes as needed for chest pain. 25 tablet 3  . OMEGA 3 1000 MG CAPS Take 1 capsule by mouth daily.    [name] omeprazole (PRILOSEC) 20 MG capsule Take 20 mg by mouth daily.    . potassium chloride SA (K-DUR,KLOR-CON) 20 MEQ tablet Take 20 mEq by mouth 2 (two) times daily.     . sitaGLIPtin (JANUVIA) 100 MG tablet Take 100 mg by mouth daily.    [name] torsemide (DEMADEX) 10 MG tablet TAKE 1 TABLET (10 MG TOTAL) BY MOUTH DAILY. 30 tablet 10  . vitamin C (ASCORBIC ACID) 500 MG tablet Take 1,000 mg by mouth daily.    . Zinc 25 MG TABS Take 25 mg by mouth every evening.    . ONE TOUCH ULTRA TEST test strip 1 each by Other route as needed.      No current facility-administered medications for this visit.      Past Medical History:  Diagnosis Date  . Allergic rhinitis    takes Zyrtec daily  . Aortic sclerosis    a. syst murmur bilat usb->sclerosis by echo 03/2013.  [name] Arthritis    hands   .  CAD (coronary artery disease)    a. 2014 CABG x 4 (L->LAD, V->Diag, V->OM, Rad->RCA);  b. 03/2013 Cath: LM nl, LAD 80p, LCX 100m, OM1 100, RCA 70-73m, all grafts patent, EF 40%.  . Chronic low back pain    cause unknown  . Chronic systolic CHF (congestive heart failure) ([place])   . Constipation    Citrucel daily  . Diabetes mellitus ([place])    takes Glipizide daily  . Diverticulosis   . Enlarged prostate    slightly  . GERD (gastroesophageal reflux disease)    takes Omeprazole daily  . H/O renal calculi    no treatment needed  . Headache(784.0)    rarely   . History of blood transfusion    no abnormal reaction noted  . History of colon polyps   . Hypercholesteremia    takes Lipitor daily  . Hypertension    takes Metoprolol daily and Lisinopril   . Ischemic cardiomyopathy    a. 03/2013 Echo: EF 25-30%, diff HK,  antsep/apical AK, Gr2 DD, Ao sclerosis, mild AI, PASP 36.  [name] Left bundle branch block   . Lipoma of back   . Nephrolithiasis   . Nocturia   . OSA (obstructive sleep apnea)   . Peripheral edema    takes Lozol daily  . Peripheral neuropathy   . Pneumonia    hx of about 10+yrs ago  . Thrombocytopenia ([place])   . Urinary frequency     ROS:   All systems reviewed and negative except as noted in the HPI.   Past Surgical History:  Procedure Laterality Date  . CARDIAC CATHETERIZATION  2014  . COLONOSCOPY    . CORONARY ARTERY BYPASS GRAFT N/A 08/23/2012   Procedure: CORONARY ARTERY BYPASS GRAFTING times four using Right Greater Saphenous Vein Graft harvested endoscopically and Left Radail Artery Graft;  Surgeon: Ivin Poot, MD;  Location: [place];  Service: Open Heart Surgery;  Laterality: N/A;  . fx humurous from [place]  2010  . INTRAOPERATIVE TRANSESOPHAGEAL ECHOCARDIOGRAM N/A 08/23/2012   Procedure: INTRAOPERATIVE TRANSESOPHAGEAL ECHOCARDIOGRAM;  Surgeon: Ivin Poot, MD;  Location: [place];  Service: Open Heart Surgery;  Laterality: N/A;  . LEFT HEART CATHETERIZATION WITH CORONARY ANGIOGRAM N/A 08/15/2012   Procedure: LEFT HEART CATHETERIZATION WITH CORONARY ANGIOGRAM;  Surgeon: Sinclair Grooms, MD;  Location: [facility] CATH LAB;  Service: Cardiovascular;  Laterality: N/A;  . LEFT HEART CATHETERIZATION WITH CORONARY ANGIOGRAM N/A 04/04/2013   Procedure: LEFT HEART CATHETERIZATION WITH CORONARY ANGIOGRAM;  Surgeon: Troy Sine, MD;  Location[facility] C[facility] LAB;  Service: Cardiovascular;  Laterality: N/A;  . left tibial platcu fX  2010  . RADIAL ARTERY HARVEST Left 08/23/2012   Procedure: RADIAL ARTERY HARVEST;  Surgeon: Ivin Poot, MD;  Location: [place];  Service: Open Heart Surgery;  Laterality: Left;     Family History  Problem Relation Age of Onset  . Hypertension Mother   . Hyperlipidemia Mother   . Cancer Mother        uterine  . Fibromyalgia Sister   . Heart attack Paternal Uncle    . Stroke Paternal Aunt      Social History   Social History  . Marital status: Widowed    Spouse name: N/A  . Number of children: N/A  . Years of education: N/A   Occupational History  . 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  Smoking status: Never Smoker  . Smokeless tobacco: Never Used  . Alcohol use Yes     Comment: rarely  . Drug use: No  . Sexual activity: Not Currently   Other Topics Concern  . Not on file   Social History Narrative  . No narrative on file     BP (!) 162/82   Pulse 76   Ht 5\' 6"  (1.676 m)   Wt 237 lb (107.5 kg)   BMI 38.25 kg/m   Physical Exam:  Well appearing 66 year old man, NAD HEENT: Unremarkable Neck:  7 cm JVD, no thyromegally Lymphatics:  No adenopathy Back:  No CVA tenderness Lungs:  Clear, with no wheezes, rales, or rhonchi. HEART:  Regular rate rhythm, no murmurs, no rubs, no clicks Abd:  soft, positive bowel sounds, no organomegally, no rebound, no guarding Ext:  2 plus pulses, no edema, no cyanosis, no clubbing Skin:  No rashes no nodules Neuro:  CN II through XII intact, motor grossly intact  EKG - reviewed, normal sinus rhythm with intraventricular conduction delay/left bundle branch block, QRS duration 166 ms.  Assess/Plan: 1. Chronic systolic heart failure - he has an ejection fraction of 25%, and I discussed the treatment options with the patient in detail. The risks, goals, benefits, and expectations of biventricular ICD insertion were reviewed. He will call us if he wishes to proceed 2. Coronary artery disease - he denies anginal symptoms. He will continue his current medical therapy  Cristopher Peru, M.D.

## 2016-12-06 NOTE — Patient Instructions (Addendum)
Medication Instructions:  Your physician recommends that you continue on your current medications as directed. Please refer to the Current Medication list given to you today.  Labwork: None ordered.  Testing/Procedures: Your physician has recommended that you have a defibrillator inserted. An implantable cardioverter defibrillator (ICD) is a small device that is placed in your chest or, in rare cases, your abdomen. This device uses electrical pulses or shocks to help control life-threatening, irregular heartbeats that could lead the heart to suddenly stop beating (sudden cardiac arrest). Leads are attached to the ICD that goes into your heart. This is done in the hospital and usually requires an overnight stay. Please see the instruction sheet given to you today for more information.  Follow-Up:  Please call us when you are ready to schedule.  Upcoming availability: November 1, 15, 20, 23 and Robert Lee [phone]    Any Other Special Instructions Will Be Listed Below (If Applicable).   If you need a refill on your cardiac medications before your next appointment, please call your pharmacy.   Biventricular Pacemaker Implantation A biventricular pacemaker implantation is a procedure to place (implant) a pacemaker into both of the lower chambers (ventricles) of the heart. A pacemaker is a small, battery-powered device that helps control the heartbeat. If the heart beats irregularly or too slowly (bradycardia), the pacemaker will pace the heart so that it beats at a normal rate or a programmed rate. The parts of a biventricular pacemaker include:  The pulse generator. The pulse generator contains a small computer and a memory system that is programmed to keep the heart beating at a certain rate. The pulse generator also produces the electrical signal that triggers the heart to beat. This is implanted under the skin of the upper chest, near the collarbone.  Wires (leads). The leads are  placed in the left and right ventricles of the heart. The leads are connected to the pulse generator. They transmit electrical pulses from the pulse generator to the heart.  This procedure may be done to treat:  Bradycardia.  Symptoms of severe heart failure, such as shortness of breath (dyspnea).  Loss of consciousness that happens repeatedly (syncope) because of an irregular heart rate.  Tell a health care provider about:  Any allergies you have.  All medicines you are taking, including vitamins, herbs, eye drops, creams, and over-the-counter medicines.  Any problems you or family members have had with anesthetic medicines.  Any blood disorders you have.  Any surgeries you have had.  Any medical conditions you have.  Whether you are pregnant or may be pregnant. What are the risks? Generally, this is a safe procedure. However, problems may occur, including:  Infection.  Bleeding.  Allergic reactions to medicines or dyes.  Damage to other structures or organs, such as your blood vessels, lungs, or heart.  Failure of the pacemaker to improve your condition.  What happens before the procedure?  Ask your health care provider about: ? Changing or stopping your regular medicines. This is especially important if you are taking diabetes medicines or blood thinners. ? Taking medicines such as aspirin and ibuprofen. These medicines can thin your blood. Do not take these medicines before your procedure if your health care provider instructs you not to.  Follow instructions from your health care provider about eating or drinking restrictions.  Do not use any tobacco products for at least 24 hours before your procedure. This includes cigarettes, chewing tobacco, or e-cigarettes.  Ask your health care  provider how your surgical site will be marked or identified.  You may be given antibiotic medicine to help prevent infection.  You may have tests, including: ? Blood  tests. ? Chest X-rays.  Plan to have someone take you home after the procedure.  If you go home right after the procedure, plan to have someone with you for 24 hours. What happens during the procedure?  To reduce your risk of infection: ? Your health care team will wash or sanitize their hands. ? Your skin will be washed with soap. ? Hair may be removed from your surgical area.  An IV tube will be inserted into one of your veins.  You will be given one or more of the following: ? A medicine to help you relax (sedative). ? A medicine to make you fall asleep (general anesthetic). ? A medicine that is injected into your spine to numb the area below and slightly above the injection site (spinal anesthetic). ? A medicine that is injected into an area of your body to numb everything below the injection site (regional anesthetic).  An incision will be made in your upper chest, near your heart.  The leads will be guided into your incision, through your blood vessels, and into your ventricles. Your surgeon will use an X-ray machine (fluoroscope) to guide the leads into your heart.  The leads will be attached to your heart muscles and to the pulse generator.  The leads will be tested to make sure that they work correctly.  The pulse generator will be implanted under your skin, near your incision.  Your incision will be closed with stitches (sutures), skin glue, or adhesive tape.  A bandage (dressing) will be placed over your incision. The procedure may vary among health care providers and hospitals. What happens after the procedure?  Your blood pressure, heart rate, breathing rate, and blood oxygen level will be monitored often until the medicines you were given have worn off.  You may continue to receive fluids and medicines through an IV tube.  You will have some pain. Pain medicines will be available to help you.  You will have a chest X-ray done. This is to make sure that your  pacemaker is in the right place.  You may have to wear compression stockings. These stockings help to prevent blood clots and reduce swelling in your legs.  You will be given a pacemaker identification card. This card lists the implant date, device model, and manufacturer of your pacemaker.  Do not drive for 24 hours if you received a sedative. This information is not intended to replace advice given to you by your health care provider. Make sure you discuss any questions you have with your health care provider. Document Released: 10/25/2011 Document Revised: 07/08/2015 Document Reviewed: 10/25/2014 Elsevier Interactive Patient Education  [employer].

## 2016-12-11 ENCOUNTER — Telehealth: Payer: Self-pay

## 2016-12-11 DIAGNOSIS — I255 Ischemic cardiomyopathy: Secondary | ICD-10-CM

## 2016-12-11 DIAGNOSIS — Z01812 Encounter for preprocedural laboratory examination: Secondary | ICD-10-CM

## 2016-12-11 NOTE — Telephone Encounter (Signed)
Call placed to Pt.  Need to schedule time for ICD on 12/28/2016.  Need to determine date Pt can come in for labs.  Left this nurse name and # for call back.

## 2016-12-14 ENCOUNTER — Other Ambulatory Visit: Payer: Self-pay | Admitting: Interventional Cardiology

## 2016-12-15 DIAGNOSIS — N400 Enlarged prostate without lower urinary tract symptoms: Secondary | ICD-10-CM | POA: Diagnosis not present

## 2016-12-15 NOTE — Telephone Encounter (Signed)
Call placed to Pt.  Notified Pt that procedure had been scheduled for November 15 @ 9:00-Pt arrival time 8:00 am.  Pt to have lab work done 12/22/2016 and pick up letter at that time.  Pt indicates understanding.

## 2016-12-22 ENCOUNTER — Other Ambulatory Visit: Payer: BLUE CROSS/BLUE SHIELD | Admitting: *Deleted

## 2016-12-22 DIAGNOSIS — I255 Ischemic cardiomyopathy: Secondary | ICD-10-CM | POA: Diagnosis not present

## 2016-12-22 DIAGNOSIS — Z01812 Encounter for preprocedural laboratory examination: Secondary | ICD-10-CM | POA: Diagnosis not present

## 2016-12-23 LAB — BASIC METABOLIC PANEL
BUN / CREAT RATIO: 14 (ref 10–24)
BUN: 19 mg/dL (ref 8–27)
CO2: 22 mmol/L (ref 20–29)
CREATININE: 1.37 mg/dL — AB (ref 0.76–1.27)
Calcium: 8.8 mg/dL (ref 8.6–10.2)
Chloride: 103 mmol/L (ref 96–106)
GFR calc non Af Amer: 54 mL/min/{1.73_m2} — ABNORMAL LOW (ref >59)
GFR, EST AFRICAN AMERICAN: 63 mL/min/{1.73_m2} (ref >59)
GLUCOSE: 151 mg/dL — AB (ref 65–99)
Potassium: 3.7 mmol/L (ref 3.5–5.2)
SODIUM: 143 mmol/L (ref 134–144)

## 2016-12-23 LAB — CBC
HEMOGLOBIN: 16.2 g/dL (ref 13.0–17.7)
Hematocrit: 47.4 % (ref 37.5–51.0)
MCH: 27.5 pg (ref 26.6–33.0)
MCHC: 34.2 g/dL (ref 31.5–35.7)
MCV: 81 fL (ref 79–97)
PLATELETS: 105 10*3/uL — AB (ref 150–379)
RBC: 5.89 x10E6/uL — ABNORMAL HIGH (ref 4.14–5.80)
RDW: 15.8 % — ABNORMAL HIGH (ref 12.3–15.4)
WBC: 5.1 10*3/uL (ref 3.4–10.8)

## 2016-12-28 ENCOUNTER — Ambulatory Visit (HOSPITAL_COMMUNITY)
Admission: RE | Admit: 2016-12-28 | Discharge: 2016-12-29 | Disposition: A | Discharge status: Home / Self Care | Payer: BLUE CROSS/BLUE SHIELD | Source: Ambulatory Visit | Attending: Internal Medicine | Admitting: Internal Medicine

## 2016-12-28 ENCOUNTER — Encounter (HOSPITAL_COMMUNITY): Payer: Self-pay | Admitting: General Practice

## 2016-12-28 ENCOUNTER — Other Ambulatory Visit: Payer: Self-pay

## 2016-12-28 ENCOUNTER — Encounter (HOSPITAL_COMMUNITY)
Admission: RE | Disposition: A | Discharge status: Home / Self Care | Payer: Self-pay | Source: Ambulatory Visit | Attending: Internal Medicine

## 2016-12-28 DIAGNOSIS — I447 Left bundle-branch block, unspecified: Secondary | ICD-10-CM | POA: Insufficient documentation

## 2016-12-28 DIAGNOSIS — Z7984 Long term (current) use of oral hypoglycemic drugs: Secondary | ICD-10-CM | POA: Diagnosis not present

## 2016-12-28 DIAGNOSIS — I2510 Atherosclerotic heart disease of native coronary artery without angina pectoris: Secondary | ICD-10-CM | POA: Insufficient documentation

## 2016-12-28 DIAGNOSIS — Z951 Presence of aortocoronary bypass graft: Secondary | ICD-10-CM | POA: Insufficient documentation

## 2016-12-28 DIAGNOSIS — Z7982 Long term (current) use of aspirin: Secondary | ICD-10-CM | POA: Diagnosis not present

## 2016-12-28 DIAGNOSIS — Z885 Allergy status to narcotic agent status: Secondary | ICD-10-CM | POA: Insufficient documentation

## 2016-12-28 DIAGNOSIS — Z79899 Other long term (current) drug therapy: Secondary | ICD-10-CM | POA: Diagnosis not present

## 2016-12-28 DIAGNOSIS — G8929 Other chronic pain: Secondary | ICD-10-CM | POA: Insufficient documentation

## 2016-12-28 DIAGNOSIS — E1140 Type 2 diabetes mellitus with diabetic neuropathy, unspecified: Secondary | ICD-10-CM | POA: Insufficient documentation

## 2016-12-28 DIAGNOSIS — E785 Hyperlipidemia, unspecified: Secondary | ICD-10-CM | POA: Diagnosis not present

## 2016-12-28 DIAGNOSIS — I5022 Chronic systolic (congestive) heart failure: Secondary | ICD-10-CM | POA: Diagnosis not present

## 2016-12-28 DIAGNOSIS — K5900 Constipation, unspecified: Secondary | ICD-10-CM | POA: Diagnosis not present

## 2016-12-28 DIAGNOSIS — I255 Ischemic cardiomyopathy: Secondary | ICD-10-CM | POA: Insufficient documentation

## 2016-12-28 DIAGNOSIS — K219 Gastro-esophageal reflux disease without esophagitis: Secondary | ICD-10-CM | POA: Insufficient documentation

## 2016-12-28 DIAGNOSIS — G4733 Obstructive sleep apnea (adult) (pediatric): Secondary | ICD-10-CM | POA: Diagnosis not present

## 2016-12-28 DIAGNOSIS — M545 Low back pain: Secondary | ICD-10-CM | POA: Diagnosis not present

## 2016-12-28 DIAGNOSIS — Z23 Encounter for immunization: Secondary | ICD-10-CM | POA: Diagnosis not present

## 2016-12-28 DIAGNOSIS — M1990 Unspecified osteoarthritis, unspecified site: Secondary | ICD-10-CM | POA: Diagnosis not present

## 2016-12-28 DIAGNOSIS — I110 Hypertensive heart disease with heart failure: Secondary | ICD-10-CM | POA: Diagnosis not present

## 2016-12-28 DIAGNOSIS — I252 Old myocardial infarction: Secondary | ICD-10-CM | POA: Diagnosis not present

## 2016-12-28 DIAGNOSIS — Z95810 Presence of automatic (implantable) cardiac defibrillator: Secondary | ICD-10-CM

## 2016-12-28 HISTORY — PX: BIV ICD INSERTION CRT-D: EP1195

## 2016-12-28 HISTORY — DX: Personal history of urinary calculi: Z87.442

## 2016-12-28 HISTORY — DX: Presence of automatic (implantable) cardiac defibrillator: Z95.810

## 2016-12-28 HISTORY — DX: Family history of other specified conditions: Z84.89

## 2016-12-28 HISTORY — PX: BI-VENTRICULAR IMPLANTABLE CARDIOVERTER DEFIBRILLATOR  (CRT-D): SHX5747

## 2016-12-28 HISTORY — DX: Type 2 diabetes mellitus without complications: E11.9

## 2016-12-28 LAB — GLUCOSE, CAPILLARY: GLUCOSE-CAPILLARY: 172 mg/dL — AB (ref 65–99)

## 2016-12-28 LAB — SURGICAL PCR SCREEN
MRSA, PCR: NEGATIVE
STAPHYLOCOCCUS AUREUS: NEGATIVE

## 2016-12-28 SURGERY — BIV ICD INSERTION CRT-D

## 2016-12-28 MED ORDER — CARVEDILOL 25 MG PO TABS
25.0000 mg | ORAL_TABLET | Freq: Two times a day (BID) | ORAL | Status: DC
  Administered 2016-12-28 – 2016-12-29 (×2): 25 mg via ORAL
  Filled 2016-12-28 (×2): qty 1

## 2016-12-28 MED ORDER — GLUCOSAMINE-CHONDROITIN PLUS PO TABS: 1.0000 | ORAL_TABLET | Freq: Two times a day (BID) | ORAL | Status: DC

## 2016-12-28 MED ORDER — NAPROXEN 500 MG PO TABS
500.0000 mg | ORAL_TABLET | Freq: Two times a day (BID) | ORAL | Status: DC | PRN
  Administered 2016-12-28: 500 mg via ORAL
  Filled 2016-12-28 (×3): qty 1

## 2016-12-28 MED ORDER — CHLORHEXIDINE GLUCONATE 4 % EX LIQD
60.0000 mL | Freq: Once | CUTANEOUS | Status: DC
  Filled 2016-12-28: qty 60

## 2016-12-28 MED ORDER — GLUCOSE BLOOD VI STRP: 1.0000 | ORAL_STRIP | Status: DC | PRN

## 2016-12-28 MED ORDER — FENTANYL CITRATE (PF) 100 MCG/2ML IJ SOLN
25.0000 ug | INTRAMUSCULAR | Status: DC | PRN
Start: 2016-12-28 — End: 2016-12-29

## 2016-12-28 MED ORDER — MIDAZOLAM HCL 5 MG/5ML IJ SOLN
INTRAMUSCULAR | Status: DC | PRN
  Administered 2016-12-28: 2 mg via INTRAVENOUS
  Administered 2016-12-28 (×5): 1 mg via INTRAVENOUS

## 2016-12-28 MED ORDER — CEFAZOLIN SODIUM-DEXTROSE 1-4 GM/50ML-% IV SOLN
1.0000 g | Freq: Four times a day (QID) | INTRAVENOUS | Status: AC
  Administered 2016-12-28 – 2016-12-29 (×3): 1 g via INTRAVENOUS
  Filled 2016-12-28 (×3): qty 50

## 2016-12-28 MED ORDER — ATORVASTATIN CALCIUM 40 MG PO TABS
40.0000 mg | ORAL_TABLET | Freq: Every day | ORAL | Status: DC
  Administered 2016-12-28: 40 mg via ORAL
  Filled 2016-12-28: qty 1

## 2016-12-28 MED ORDER — MIDAZOLAM HCL 5 MG/5ML IJ SOLN
INTRAMUSCULAR | Status: AC
  Filled 2016-12-28: qty 5

## 2016-12-28 MED ORDER — IBUPROFEN 200 MG PO TABS: 200.0000 mg | ORAL_TABLET | Freq: Two times a day (BID) | ORAL | Status: DC | PRN

## 2016-12-28 MED ORDER — SODIUM CHLORIDE 0.9 % IV SOLN
INTRAVENOUS | Status: DC
  Administered 2016-12-28: 09:00:00 via INTRAVENOUS

## 2016-12-28 MED ORDER — SODIUM CHLORIDE 0.9 % IR SOLN
80.0000 mg | Status: AC
  Administered 2016-12-28: 80 mg
  Filled 2016-12-28: qty 2

## 2016-12-28 MED ORDER — TORSEMIDE 10 MG PO TABS
10.0000 mg | ORAL_TABLET | Freq: Every day | ORAL | Status: DC
  Administered 2016-12-28 – 2016-12-29 (×2): 10 mg via ORAL
  Filled 2016-12-28 (×3): qty 1

## 2016-12-28 MED ORDER — LUTEIN 20 PO CAPS: 20.0000 mg | ORAL_CAPSULE | Freq: Every evening | ORAL | Status: DC

## 2016-12-28 MED ORDER — ONDANSETRON HCL 4 MG/2ML IJ SOLN: 4.0000 mg | Freq: Four times a day (QID) | INTRAMUSCULAR | Status: DC | PRN

## 2016-12-28 MED ORDER — ASPIRIN EC 81 MG PO TBEC
81.0000 mg | DELAYED_RELEASE_TABLET | Freq: Every day | ORAL | Status: DC
  Administered 2016-12-28 – 2016-12-29 (×2): 81 mg via ORAL
  Filled 2016-12-28 (×2): qty 1

## 2016-12-28 MED ORDER — ZINC 25 MG PO TABS: 25.0000 mg | ORAL_TABLET | Freq: Every evening | ORAL | Status: DC

## 2016-12-28 MED ORDER — SODIUM CHLORIDE 0.9 % IR SOLN
Status: AC
  Filled 2016-12-28: qty 2

## 2016-12-28 MED ORDER — HEPARIN (PORCINE) IN NACL 2-0.9 UNIT/ML-% IJ SOLN
INTRAMUSCULAR | Status: AC | PRN
  Administered 2016-12-28: 500 mL

## 2016-12-28 MED ORDER — MUPIROCIN 2 % EX OINT
TOPICAL_OINTMENT | CUTANEOUS | Status: AC
  Administered 2016-12-28: 1 via TOPICAL
  Filled 2016-12-28: qty 22

## 2016-12-28 MED ORDER — VITAMIN C 500 MG PO TABS
500.0000 mg | ORAL_TABLET | Freq: Two times a day (BID) | ORAL | Status: DC
  Administered 2016-12-28 – 2016-12-29 (×2): 500 mg via ORAL
  Filled 2016-12-28 (×2): qty 1

## 2016-12-28 MED ORDER — INFLUENZA VAC SPLIT QUAD 0.5 ML IM SUSY
0.5000 mL | PREFILLED_SYRINGE | INTRAMUSCULAR | Status: AC
  Administered 2016-12-29: 0.5 mL via INTRAMUSCULAR
  Filled 2016-12-28: qty 0.5

## 2016-12-28 MED ORDER — POTASSIUM CHLORIDE CRYS ER 20 MEQ PO TBCR
20.0000 meq | EXTENDED_RELEASE_TABLET | Freq: Two times a day (BID) | ORAL | Status: DC
  Administered 2016-12-28 – 2016-12-29 (×3): 20 meq via ORAL
  Filled 2016-12-28 (×3): qty 1

## 2016-12-28 MED ORDER — IOPAMIDOL (ISOVUE-370) INJECTION 76%
INTRAVENOUS | Status: DC | PRN
  Administered 2016-12-28: 60 mL via INTRAVENOUS

## 2016-12-28 MED ORDER — LIDOCAINE HCL (PF) 1 % IJ SOLN
INTRAMUSCULAR | Status: AC
  Filled 2016-12-28: qty 60

## 2016-12-28 MED ORDER — GLIMEPIRIDE 2 MG PO TABS
2.0000 mg | ORAL_TABLET | ORAL | Status: DC
  Administered 2016-12-29: 2 mg via ORAL
  Filled 2016-12-28: qty 1

## 2016-12-28 MED ORDER — BUPIVACAINE HCL (PF) 0.25 % IJ SOLN
INTRAMUSCULAR | Status: DC | PRN
  Administered 2016-12-28: 50 mL

## 2016-12-28 MED ORDER — ADULT MULTIVITAMIN W/MINERALS CH
1.0000 | ORAL_TABLET | Freq: Every day | ORAL | Status: DC
  Administered 2016-12-28 – 2016-12-29 (×2): 1 via ORAL
  Filled 2016-12-28 (×4): qty 1

## 2016-12-28 MED ORDER — FENTANYL CITRATE (PF) 100 MCG/2ML IJ SOLN
INTRAMUSCULAR | Status: AC
  Filled 2016-12-28: qty 2

## 2016-12-28 MED ORDER — NITROGLYCERIN 0.4 MG SL SUBL: 0.4000 mg | SUBLINGUAL_TABLET | SUBLINGUAL | Status: DC | PRN

## 2016-12-28 MED ORDER — CEFAZOLIN SODIUM-DEXTROSE 2-4 GM/100ML-% IV SOLN
INTRAVENOUS | Status: AC
  Filled 2016-12-28: qty 100

## 2016-12-28 MED ORDER — MUPIROCIN 2 % EX OINT
1.0000 "application " | TOPICAL_OINTMENT | Freq: Once | CUTANEOUS | Status: AC
  Administered 2016-12-28: 1 via TOPICAL
  Filled 2016-12-28: qty 22

## 2016-12-28 MED ORDER — CEFAZOLIN SODIUM-DEXTROSE 2-4 GM/100ML-% IV SOLN
2.0000 g | INTRAVENOUS | Status: AC
  Administered 2016-12-28: 2 g via INTRAVENOUS
  Filled 2016-12-28: qty 100

## 2016-12-28 MED ORDER — LISINOPRIL 40 MG PO TABS
40.0000 mg | ORAL_TABLET | Freq: Every day | ORAL | Status: DC
  Administered 2016-12-28 – 2016-12-29 (×2): 40 mg via ORAL
  Filled 2016-12-28 (×2): qty 1

## 2016-12-28 MED ORDER — FENTANYL CITRATE (PF) 100 MCG/2ML IJ SOLN
INTRAMUSCULAR | Status: DC | PRN
  Administered 2016-12-28 (×2): 25 ug via INTRAVENOUS
  Administered 2016-12-28 (×3): 12.5 ug via INTRAVENOUS

## 2016-12-28 MED ORDER — OMEGA-3-ACID ETHYL ESTERS 1 G PO CAPS
1.0000 | ORAL_CAPSULE | Freq: Every day | ORAL | Status: DC
Start: 2016-12-28 — End: 2016-12-29
  Administered 2016-12-28 – 2016-12-29 (×2): 1 g via ORAL
  Filled 2016-12-28 (×4): qty 1

## 2016-12-28 MED ORDER — HEPARIN (PORCINE) IN NACL 2-0.9 UNIT/ML-% IJ SOLN
INTRAMUSCULAR | Status: AC
  Filled 2016-12-28: qty 500

## 2016-12-28 MED ORDER — B-COMPLEX PO TABS: 1.0000 | ORAL_TABLET | Freq: Every evening | ORAL | Status: DC

## 2016-12-28 SURGICAL SUPPLY — 19 items
ASSURA CRTD CD3369-40C (ICD Generator) ×2 IMPLANT
BALLN COR SINUS VENO 6FR 80 (BALLOONS) ×2
BALLOON COR SINUS VENO 6FR 80 (BALLOONS) ×1 IMPLANT
CABLE SURGICAL S-101-97-12 (CABLE) ×4 IMPLANT
CATH CPS DIRECT 135 DS2C020 (CATHETERS) ×2 IMPLANT
CATH HEX JOSEPH 2-5-2 65CM 6F (CATHETERS) ×2 IMPLANT
CPS IMPLANT KIT 410190 (MISCELLANEOUS) ×2 IMPLANT
DEFIB ASSURA CRT-D (ICD Generator) ×1 IMPLANT
LEAD DURATA 7122-65CM (Lead) ×2 IMPLANT
LEAD QUARTET 1458Q-86CM (Lead) ×2 IMPLANT
LEAD TENDRIL MRI 52CM LPA1200M (Lead) ×2 IMPLANT
PAD DEFIB LIFELINK (PAD) ×2 IMPLANT
SHEATH CLASSIC 7F (SHEATH) ×2 IMPLANT
SHEATH CLASSIC 8F (SHEATH) ×4 IMPLANT
SLITTER UNIVERSAL DS2A003 (MISCELLANEOUS) ×4 IMPLANT
TRAY PACEMAKER INSERTION (PACKS) ×2 IMPLANT
WIRE ASAHI PROWATER 180CM (WIRE) ×2 IMPLANT
WIRE LUGE 182CM (WIRE) ×4 IMPLANT
WIRE MAILMAN 182CM (WIRE) ×4 IMPLANT

## 2016-12-28 NOTE — Interval H&P Note (Signed)
History and Physical Interval Note:  12/28/2016 8:37 AM  Kenneth Giles  has presented today for surgery, with the diagnosis of ischemic cardiomyopahy, chf  The various methods of treatment have been discussed with the patient and family. After consideration of risks, benefits and other options for treatment, the patient has consented to  Procedure(s): BIV ICD INSERTION CRT-D (N/A) as a surgical intervention .  The patient's history has been reviewed, patient examined, no change in status, stable for surgery.  I have reviewed the patient's chart and labs.  Questions were answered to the patient's satisfaction.     Cristopher Peru

## 2016-12-28 NOTE — Discharge Summary (Signed)
ELECTROPHYSIOLOGY PROCEDURE DISCHARGE SUMMARY    Patient ID: Kenneth Giles,  MRN: [id number], DOB/AGE: July 19, 1950 66 y.o.  Admit date: 12/28/2016 Discharge date: 12/29/2016  Primary Care Physician: Lajean Manes, MD  Primary Cardiologist: Dr. Tamala Julian Electrophysiologist: Dr. Lovena Le  Primary Discharge Diagnosis:  1. ICM 2. LBBB  Secondary Discharge Diagnosis:  1. Chronic CHF (systolic) 2. CAD 3. DM 4. HTN 5. HLD 6. OSA  Allergies  Allergen Reactions  . Tylenol With Codeine #3 [Acetaminophen-Codeine] Nausea Only     Procedures This Admission:  1.  Implantation of a SJM CRT-D on 12/28/16 by Dr Lovena Le.  The patient received St. Jude (serial # [id number] ) right atrial lead and a St. Jude (serial number [id number]) right ventricular defibrillator lead, St. Jude(serial number [id number]) lead (LV), St. Jude (serial Number [id number]) biventricular ICD DFT's were deferred at time of implant.   There were no immediate post procedure complications. 2.  CXR on 12/29/16 demonstrated no pneumothorax status post device implantation.   Brief HPI: Kenneth Giles is a 66 y.o. male was referred to electrophysiology in the outpatient setting for consideration of ICD implantation.  Past medical history is noted above.  The patient has persistent LV dysfunction despite guideline directed therapy.  Risks, benefits, and alternatives to ICD implantation were reviewed with the patient who wished to proceed.   Hospital Course:  The patient was admitted and underwent implantation of an ICD with details as outlined above. He was monitored on telemetry overnight which demonstrated SR/V paced rhythm.  Left chest was without hematoma or ecchymosis.  The device was interrogated and found to be functioning normally.  CXR was obtained and demonstrated no pneumothorax status post device implantation.  Wound care, arm mobility, and restrictions were reviewed with the patient.  The patient was examined by  Dr. Lovena Le and considered stable for discharge to home.   The patient's discharge medications include an ACE-I (lisinopril) and beta blocker (carvedilol).   Physical Exam: Vitals:   12/28/16 2034 12/29/16 0043 12/29/16 0458 12/29/16 0714  BP: (!) 169/78 (!) 174/66 (!) 154/74 (!) 158/62  Pulse: 78 71 64 63  Resp: 18 18 20 20   Temp: 98.5 F (36.9 C) 98 F (36.7 C) 98.2 F (36.8 C) 97.9 F (36.6 C)  TempSrc: Oral Oral Oral Oral  SpO2: 95% 94% 95% 94%  Weight:   229 lb 6.4 oz (104.1 kg)   Height:        GEN- The patient is well appearing, alert and oriented x 3 today.   HEENT: normocephalic, atraumatic; sclera clear, conjunctiva pink; hearing intact; oropharynx clear Lungs- CTA b/l, normal work of breathing.  No wheezes, rales, rhonchi Heart-  RRR, no murmurs, rubs or gallops, PMI not laterally displaced GI- soft, non-tender, non-distended Extremities- no clubbing, cyanosis, or edema MS- no significant deformity or atrophy Skin- warm and dry, no rash or lesion, left chest without hematoma/ecchymosis Psych- euthymic mood, full affect Neuro- no gross defecits  Labs:   Lab Results  Component Value Date   WBC 5.1 12/22/2016   HGB 16.2 12/22/2016   HCT 47.4 12/22/2016   MCV 81 12/22/2016   PLT 105 (L) 12/22/2016    Recent Labs  Lab 12/22/16 1550  NA 143  K 3.7  CL 103  CO2 22  BUN 19  CREATININE 1.37*  CALCIUM 8.8  GLUCOSE 151*    Discharge Medications:  Allergies as of 12/29/2016      Reactions   Tylenol With Codeine #3 [acetaminophen-codeine]  Nausea Only      Medication List    TAKE these medications   aspirin EC 81 MG tablet Take 1 tablet (81 mg total) by mouth daily.   atorvastatin 40 MG tablet Commonly known as:  LIPITOR Take 40 mg by mouth daily at 6 PM.   B-Complex Tabs Take 1 tablet every evening by mouth.   carvedilol 25 MG tablet Commonly known as:  COREG Take 1 tablet (25 mg total) by mouth 2 (two) times daily.   cetirizine 10 MG  tablet Commonly known as:  ZYRTEC Take 10 mg by mouth daily.   CITRUCEL PO Take 500 mg daily as needed by mouth (constipation).   fenofibrate micronized 200 MG capsule Commonly known as:  LOFIBRA Take 200 mg by mouth daily before breakfast.   glimepiride 2 MG tablet Commonly known as:  AMARYL Take 2 mg by mouth every morning.   GLUCOSAMINE-CHONDROITIN PLUS Tabs Take 1 tablet by mouth 2 (two) times daily.   LUTEIN 20 Caps Take 20 mg by mouth every evening.   ibuprofen 200 MG tablet Commonly known as:  ADVIL,MOTRIN Take 200 mg 2 (two) times daily as needed by mouth for mild pain or moderate pain.   lisinopril 40 MG tablet Commonly known as:  PRINIVIL,ZESTRIL Take 1 tablet (40 mg total) by mouth daily.   multivitamin tablet Take 1 tablet by mouth daily.   naproxen 500 MG tablet Commonly known as:  NAPROSYN Take 1 tablet (500 mg total) by mouth 2 (two) times daily as needed for mild pain, moderate pain or headache (TAKE WITH MEALS.).   nitroGLYCERIN 0.4 MG SL tablet Commonly known as:  NITROSTAT Place 1 tablet (0.4 mg total) under the tongue every 5 (five) minutes as needed for chest pain.   Omega 3 1000 MG Caps Take 1 capsule by mouth daily.   omeprazole 20 MG capsule Commonly known as:  PRILOSEC Take 20 mg by mouth daily.   ONE TOUCH ULTRA TEST test strip Generic drug:  glucose blood 1 each by Other route as needed.   potassium chloride SA 20 MEQ tablet Commonly known as:  K-DUR,KLOR-CON Take 20 mEq by mouth 2 (two) times daily.   sitaGLIPtin 100 MG tablet Commonly known as:  JANUVIA Take 100 mg every morning by mouth.   torsemide 10 MG tablet Commonly known as:  DEMADEX TAKE 1 TABLET (10 MG TOTAL) BY MOUTH DAILY.   vitamin C 500 MG tablet Commonly known as:  ASCORBIC ACID Take 500 mg 2 (two) times daily by mouth.   Zinc 25 MG Tabs Take 25 mg by mouth every evening.       Disposition:  Home   Follow-up Information    [place]  Office Follow up on 01/10/2017.   Specialty:  Cardiology Why:  11:30AM, Wound check visit Contact information: [address], [address] [place]       Evans Lance, MD Follow up on 04/05/2017.   Specialty:  Cardiology Why:  10:45AM Contact information: [address] [address] [phone]           Duration of Discharge Encounter: Greater than 30 minutes including physician time.  Venetia Night, PA-C 12/29/2016 9:20 AM  EP Attending  Patient seen and examined. Agree with above. He has undergone BiV ICD insertion and is stable. His device has been interogated and is working normally. CXR is ok. He is stable for DC home with  usual followup.  Mikle Bosworth.D.

## 2016-12-28 NOTE — Discharge Instructions (Signed)
° ° °  Supplemental Discharge Instructions for  Pacemaker/Defibrillator Patients  Activity No heavy lifting or vigorous activity with your left/right arm for 6 to 8 weeks.  Do not raise your left/right arm above your head for one week.  Gradually raise your affected arm as drawn below.             01/01/17                   01/02/17                  01/03/17                 01/04/17 __  NO DRIVING for  1 week   ; you may begin driving on   61/60/73  .  WOUND CARE - Keep the wound area clean and dry.  Do not get this area wet for one week. No showers for one week; you may shower on  01/04/17 . - The tape/steri-strips on your wound will fall off; do not pull them off.  No bandage is needed on the site.  DO  NOT apply any creams, oils, or ointments to the wound area. - If you notice any drainage or discharge from the wound, any swelling or bruising at the site, or you develop a fever > 101? F after you are discharged home, call the office at once.  Special Instructions - You are still able to use cellular telephones; use the ear opposite the side where you have your pacemaker/defibrillator.  Avoid carrying your cellular phone near your device. - When traveling through airports, show security personnel your identification card to avoid being screened in the metal detectors.  Ask the security personnel to use the hand wand. - Avoid arc welding equipment, MRI testing (magnetic resonance imaging), TENS units (transcutaneous nerve stimulators).  Call the office for questions about other devices. - Avoid electrical appliances that are in poor condition or are not properly grounded. - Microwave ovens are safe to be near or to operate.  Additional information for defibrillator patients should your device go off: - If your device goes off ONCE and you feel fine afterward, notify the device clinic nurses. - If your device goes off ONCE and you do not feel well afterward, call 911. - If your device goes  off TWICE, call 911. - If your device goes off THREE times in one day, call 911.  DO NOT DRIVE YOURSELF OR A FAMILY MEMBER WITH A DEFIBRILLATOR TO THE HOSPITAL--CALL 911.

## 2016-12-28 NOTE — Progress Notes (Signed)
Orthopedic Tech Progress Note Patient Details:  Kashaun Bebo [facility] [birth year] [id number]  Ortho Devices Ortho Device/Splint Location: Pt has arm sling at this time.    Kristopher Oppenheim 12/28/2016, 2:25 PM

## 2016-12-29 ENCOUNTER — Encounter (HOSPITAL_COMMUNITY): Payer: Self-pay | Admitting: Internal Medicine

## 2016-12-29 ENCOUNTER — Ambulatory Visit (HOSPITAL_COMMUNITY): Payer: BLUE CROSS/BLUE SHIELD

## 2016-12-29 DIAGNOSIS — I252 Old myocardial infarction: Secondary | ICD-10-CM | POA: Diagnosis not present

## 2016-12-29 DIAGNOSIS — K5900 Constipation, unspecified: Secondary | ICD-10-CM | POA: Diagnosis not present

## 2016-12-29 DIAGNOSIS — I110 Hypertensive heart disease with heart failure: Secondary | ICD-10-CM | POA: Diagnosis not present

## 2016-12-29 DIAGNOSIS — G4733 Obstructive sleep apnea (adult) (pediatric): Secondary | ICD-10-CM | POA: Diagnosis not present

## 2016-12-29 DIAGNOSIS — E1140 Type 2 diabetes mellitus with diabetic neuropathy, unspecified: Secondary | ICD-10-CM | POA: Diagnosis not present

## 2016-12-29 DIAGNOSIS — Z95810 Presence of automatic (implantable) cardiac defibrillator: Secondary | ICD-10-CM | POA: Diagnosis not present

## 2016-12-29 DIAGNOSIS — E785 Hyperlipidemia, unspecified: Secondary | ICD-10-CM | POA: Diagnosis not present

## 2016-12-29 DIAGNOSIS — G8929 Other chronic pain: Secondary | ICD-10-CM | POA: Diagnosis not present

## 2016-12-29 DIAGNOSIS — I255 Ischemic cardiomyopathy: Secondary | ICD-10-CM | POA: Diagnosis not present

## 2016-12-29 DIAGNOSIS — I2510 Atherosclerotic heart disease of native coronary artery without angina pectoris: Secondary | ICD-10-CM | POA: Diagnosis not present

## 2016-12-29 DIAGNOSIS — Z7982 Long term (current) use of aspirin: Secondary | ICD-10-CM | POA: Diagnosis not present

## 2016-12-29 DIAGNOSIS — K219 Gastro-esophageal reflux disease without esophagitis: Secondary | ICD-10-CM | POA: Diagnosis not present

## 2016-12-29 DIAGNOSIS — I447 Left bundle-branch block, unspecified: Secondary | ICD-10-CM | POA: Diagnosis not present

## 2016-12-29 DIAGNOSIS — I5022 Chronic systolic (congestive) heart failure: Secondary | ICD-10-CM | POA: Diagnosis not present

## 2016-12-29 DIAGNOSIS — M1990 Unspecified osteoarthritis, unspecified site: Secondary | ICD-10-CM | POA: Diagnosis not present

## 2016-12-29 DIAGNOSIS — M545 Low back pain: Secondary | ICD-10-CM | POA: Diagnosis not present

## 2016-12-29 DIAGNOSIS — Z885 Allergy status to narcotic agent status: Secondary | ICD-10-CM | POA: Diagnosis not present

## 2016-12-29 NOTE — Plan of Care (Signed)
  Pain Managment: General experience of comfort will improve 12/29/2016 0041 - Progressing by Tristan Schroeder, RN

## 2016-12-29 NOTE — Progress Notes (Signed)
Orders received for pt discharge.  Discharge summary printed and reviewed with pt.  Explained medication regimen, and pt had no further questions at this time.  IV removed and site remains clean, dry, intact.  Telemetry removed.  Pt in stable condition and awaiting transport. 

## 2017-01-10 ENCOUNTER — Ambulatory Visit: Payer: BLUE CROSS/BLUE SHIELD

## 2017-01-11 ENCOUNTER — Ambulatory Visit (HOSPITAL_BASED_OUTPATIENT_CLINIC_OR_DEPARTMENT_OTHER): Payer: BLUE CROSS/BLUE SHIELD | Attending: Interventional Cardiology | Admitting: Cardiology

## 2017-01-11 DIAGNOSIS — G4733 Obstructive sleep apnea (adult) (pediatric): Secondary | ICD-10-CM | POA: Diagnosis not present

## 2017-01-11 DIAGNOSIS — R0683 Snoring: Secondary | ICD-10-CM | POA: Insufficient documentation

## 2017-01-12 ENCOUNTER — Ambulatory Visit (INDEPENDENT_AMBULATORY_CARE_PROVIDER_SITE_OTHER): Payer: BLUE CROSS/BLUE SHIELD | Admitting: *Deleted

## 2017-01-12 DIAGNOSIS — I255 Ischemic cardiomyopathy: Secondary | ICD-10-CM | POA: Diagnosis not present

## 2017-01-12 LAB — CUP PACEART INCLINIC DEVICE CHECK
Date Time Interrogation Session: 20181130111406
HIGH POWER IMPEDANCE MEASURED VALUE: 59.625
Implantable Lead Implant Date: 20181115
Lead Channel Pacing Threshold Amplitude: 1 V
Lead Channel Pacing Threshold Amplitude: 1 V
Lead Channel Pacing Threshold Pulse Width: 0.5 ms
Lead Channel Pacing Threshold Pulse Width: 0.5 ms
Lead Channel Pacing Threshold Pulse Width: 0.5 ms
Lead Channel Pacing Threshold Pulse Width: 0.5 ms
Lead Channel Sensing Intrinsic Amplitude: 12 mV
Lead Channel Setting Pacing Amplitude: 1.875
Lead Channel Setting Pacing Pulse Width: 0.5 ms
Lead Channel Setting Pacing Pulse Width: 0.5 ms
MDC IDC LEAD IMPLANT DT: 20181115
MDC IDC LEAD IMPLANT DT: 20181115
MDC IDC LEAD LOCATION: 753858
MDC IDC LEAD LOCATION: 753859
MDC IDC LEAD LOCATION: 753860
MDC IDC MSMT BATTERY REMAINING LONGEVITY: 86 mo
MDC IDC MSMT LEADCHNL LV IMPEDANCE VALUE: 550 Ohm
MDC IDC MSMT LEADCHNL LV PACING THRESHOLD AMPLITUDE: 1 V
MDC IDC MSMT LEADCHNL RA IMPEDANCE VALUE: 487.5 Ohm
MDC IDC MSMT LEADCHNL RA PACING THRESHOLD AMPLITUDE: 1 V
MDC IDC MSMT LEADCHNL RA PACING THRESHOLD PULSEWIDTH: 0.5 ms
MDC IDC MSMT LEADCHNL RA SENSING INTR AMPL: 5 mV
MDC IDC MSMT LEADCHNL RV IMPEDANCE VALUE: 462.5 Ohm
MDC IDC MSMT LEADCHNL RV PACING THRESHOLD AMPLITUDE: 0.75 V
MDC IDC MSMT LEADCHNL RV PACING THRESHOLD AMPLITUDE: 0.75 V
MDC IDC MSMT LEADCHNL RV PACING THRESHOLD PULSEWIDTH: 0.5 ms
MDC IDC PG IMPLANT DT: 20181115
MDC IDC SET LEADCHNL LV PACING AMPLITUDE: 2 V
MDC IDC SET LEADCHNL RV PACING AMPLITUDE: 2 V
MDC IDC SET LEADCHNL RV SENSING SENSITIVITY: 0.5 mV
MDC IDC STAT BRADY RA PERCENT PACED: 19 %
MDC IDC STAT BRADY RV PERCENT PACED: 99.95 %
Pulse Gen Serial Number: 9780956

## 2017-01-12 NOTE — Progress Notes (Signed)
Wound check appointment. Steri-strips removed. Wound without redness or edema. Incision edges approximated, wound well healed. Normal device function. Thresholds, sensing, and impedances consistent with implant measurements. Device programmed at [employer]. Histogram distribution appropriate for patient and level of activity. No mode switches or ventricular arrhythmias noted. Patient educated about wound care, arm mobility, lifting restrictions, shock plan. ROV with [facility] in february for 56month follow up.

## 2017-01-30 NOTE — Procedures (Signed)
NAME: Kenneth Giles DATE OF BIRTH:  May 02, 1950 MEDICAL RECORD NUMBER [id number]  [facility]  PHYSICIAN: Yanil Dawe  DATE OF STUDY: 01/11/2017  CLINICAL INFORMATION Sleep Study Type: Split Night CPAP  Indication for sleep study: Diabetes, OSA, Snoring  Epworth Sleepiness Score: 8  SLEEP STUDY TECHNIQUE As per the AASM Manual for the Scoring of Sleep and Associated Events v2.3 (April 2016) with a hypopnea requiring 4% desaturations.  The channels recorded and monitored were frontal, central and occipital EEG, electrooculogram (EOG), submentalis EMG (chin), nasal and oral airflow, thoracic and abdominal wall motion, anterior tibialis EMG, snore microphone, electrocardiogram, and pulse oximetry. Continuous positive airway pressure (CPAP) was initiated when the patient met split night criteria and was titrated according to treat sleep-disordered breathing.  MEDICATIONS Medications self-administered by patient taken the night of the study : N/A  RESPIRATORY PARAMETERS Diagnostic Total AHI (/hr): 23.1  RDI (/hr):25.3  OA Index (/hr): 2.7  CA Index (/hr): 0.0 REM AHI (/hr): 54.2  NREM AHI (/hr):19.0  Supine AHI (/hr):0.0  Non-supine AHI (/hr):23.09 Min O2 Sat (%):81.00  Mean O2 (%): 92.67  Time below 88% (min):3.1   Titration Optimal Pressure (cm):14  AHI at Optimal Pressure (/hr):0.0  Min O2 at Optimal Pressure (%):90.0 Supine % at Optimal (%):70  Sleep % at Optimal (%):87    SLEEP ARCHITECTURE The recording time for the entire night was 405.4 minutes.  During a baseline period of 160.6 minutes, the patient slept for 135.2 minutes in REM and nonREM, yielding a sleep efficiency of 84.2%. Sleep onset after lights out was 12.3 minutes with a REM latency of 66.0 minutes. The patient spent 5.18% of the night in stage N1 sleep, 83.36% in stage N2 sleep, 0.00% in stage N3 and 11.46% in REM.  During the titration period of 238.4 minutes, the  patient slept for 222.1 minutes in REM and nonREM, yielding a sleep efficiency of 93.2%. Sleep onset after CPAP initiation was 3.3 minutes with a REM latency of 38.5 minutes. The patient spent 6.75% of the night in stage N1 sleep, 76.54% in stage N2 sleep, 0.00% in stage N3 and 16.71% in REM.  CARDIAC DATA The 2 lead EKG demonstrated sinus rhythm, pacemaker generated. The mean heart rate was 63.61 beats per minute. Other EKG findings include: PVCs.  LEG MOVEMENT DATA The total Periodic Limb Movements of Sleep (PLMS) were 139. The PLMS index was 23.22 .  IMPRESSIONS - Moderate obstructive sleep apnea occurred during the diagnostic portion of the study(AHI = 23.1/hour). An optimal PAP pressure was selected for this patient ( 14 cm of water) - No significant central sleep apnea occurred during the diagnostic portion of the study (CAI = 0.0/hour). - Moderate oxygen desaturation was noted during the diagnostic portion of the study (Min O2 =81.00%). - The patient snored with moderate snoring volume during the diagnostic portion of the study. - No cardiac abnormalities were noted during this study. - Moderate periodic limb movements of sleep occurred during the study.   DIAGNOSIS - Obstructive Sleep Apnea (327.23 [G47.33 ICD-10])  RECOMMENDATIONS - A trial of CPAP therapy on 14 cm H2O with a Medium size [employer]kel Full Face Mask Simplus mask and heated humidification. - Avoid alcohol, sedatives and other CNS depressants that may worsen sleep apnea and disrupt normal sleep architecture. - Sleep hygiene should be reviewed to assess factors that may improve sleep quality. - Weight management and regular exercise should be initiated or continued. - Return to [place]  for re-evaluation after 10 weeks of therapy  [place], American Board of Sleep Medicine  ELECTRONICALLY SIGNED ON:  01/30/2017, 9:18 PM [place] PH: [phone]   FX: [phone]  [phone] [place]

## 2017-01-31 ENCOUNTER — Other Ambulatory Visit: Payer: Self-pay | Admitting: Physician Assistant

## 2017-02-01 ENCOUNTER — Telehealth: Payer: Self-pay | Admitting: *Deleted

## 2017-02-01 NOTE — Telephone Encounter (Signed)
Informed patient of sleep study results and patient understanding was verbalized. Patient understands he has sleep apnea and had a successful CPAP Titration and will be set up with a CPAP unit. Patient understands he will be contacted by [facility] to set up his cpap. He understands to call if [facility] does not contact him with new setup in a timely manner. He understands he will be called once confirmation has been received from [facility] that he has received his new machine to schedule 10 week follow up appointment.  [facility] notified of new cpap order  Please add to Maryfrances Bunnell He was grateful for the call and thanked me.

## 2017-02-01 NOTE — Telephone Encounter (Signed)
-----   Message from Sueanne Margarita, MD sent at 01/30/2017  9:22 PM EST ----- Please let patient know that they have significant sleep apnea and had successful CPAP titration and will be set up with CPAP unit.  Please let DME know that order is in EPIC.  Please set patient up for OV in 10 weeks

## 2017-02-23 DIAGNOSIS — I10 Essential (primary) hypertension: Secondary | ICD-10-CM | POA: Diagnosis not present

## 2017-02-23 DIAGNOSIS — E1140 Type 2 diabetes mellitus with diabetic neuropathy, unspecified: Secondary | ICD-10-CM | POA: Diagnosis not present

## 2017-03-30 DIAGNOSIS — G4733 Obstructive sleep apnea (adult) (pediatric): Secondary | ICD-10-CM | POA: Diagnosis not present

## 2017-04-05 ENCOUNTER — Encounter: Payer: Self-pay | Admitting: Internal Medicine

## 2017-04-05 ENCOUNTER — Ambulatory Visit (INDEPENDENT_AMBULATORY_CARE_PROVIDER_SITE_OTHER): Payer: BLUE CROSS/BLUE SHIELD | Admitting: Internal Medicine

## 2017-04-05 VITALS — BP 154/80 | HR 82 | Ht 67.5 in | Wt 237.0 lb

## 2017-04-05 DIAGNOSIS — I255 Ischemic cardiomyopathy: Secondary | ICD-10-CM

## 2017-04-05 DIAGNOSIS — Z95810 Presence of automatic (implantable) cardiac defibrillator: Secondary | ICD-10-CM | POA: Diagnosis not present

## 2017-04-05 DIAGNOSIS — I5022 Chronic systolic (congestive) heart failure: Secondary | ICD-10-CM

## 2017-04-05 LAB — CUP PACEART INCLINIC DEVICE CHECK
Brady Statistic RA Percent Paced: 19 %
Brady Statistic RV Percent Paced: 99 %
HighPow Impedance: 70 Ohm
Implantable Lead Implant Date: 20181115
Implantable Lead Location: 753858
Implantable Lead Location: 753860
Implantable Lead Model: 7122
Lead Channel Pacing Threshold Amplitude: 0.5 V
Lead Channel Pacing Threshold Amplitude: 1 V
Lead Channel Pacing Threshold Pulse Width: 0.5 ms
Lead Channel Sensing Intrinsic Amplitude: 12 mV
Lead Channel Sensing Intrinsic Amplitude: 5 mV
Lead Channel Setting Pacing Amplitude: 2 V
Lead Channel Setting Pacing Amplitude: 2 V
Lead Channel Setting Pacing Pulse Width: 0.5 ms
Lead Channel Setting Sensing Sensitivity: 0.5 mV
MDC IDC LEAD IMPLANT DT: 20181115
MDC IDC LEAD IMPLANT DT: 20181115
MDC IDC LEAD LOCATION: 753859
MDC IDC MSMT LEADCHNL LV IMPEDANCE VALUE: 660 Ohm
MDC IDC MSMT LEADCHNL LV PACING THRESHOLD AMPLITUDE: 1.25 V
MDC IDC MSMT LEADCHNL LV PACING THRESHOLD PULSEWIDTH: 0.5 ms
MDC IDC MSMT LEADCHNL RA IMPEDANCE VALUE: 450 Ohm
MDC IDC MSMT LEADCHNL RV IMPEDANCE VALUE: 450 Ohm
MDC IDC MSMT LEADCHNL RV PACING THRESHOLD PULSEWIDTH: 0.5 ms
MDC IDC PG IMPLANT DT: 20181115
MDC IDC PG SERIAL: 9780956
MDC IDC SESS DTM: 20190221113111
MDC IDC SET LEADCHNL LV PACING PULSEWIDTH: 0.5 ms
MDC IDC SET LEADCHNL RA PACING AMPLITUDE: 1.875
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
## 2017-04-05 NOTE — Patient Instructions (Signed)
Medication Instructions:  Your physician recommends that you continue on your current medications as directed. Please refer to the Current Medication list given to you today.  Labwork: None ordered.  Testing/Procedures: None ordered.  Follow-Up: Your physician wants you to follow-up in: 9 months with Dr. Lovena Le.   You will receive a reminder letter in the mail two months in advance. If you don't receive a letter, please call our office to schedule the follow-up appointment.   Remote monitoring is used to monitor your ICD from home. This monitoring reduces the number of office visits required to check your device to one time per year. It allows [place] to keep an eye on the functioning of your device to ensure it is working properly. You are scheduled for a device check from home on 07/05/2017. You may send your transmission at any time that day. If you have a wireless device, the transmission will be sent automatically. After your physician reviews your transmission, you will receive a postcard with your next transmission date.    Any Other Special Instructions Will Be Listed Below (If Applicable).     If you need a refill on your cardiac medications before your next appointment, please call your pharmacy.

## 2017-04-27 DIAGNOSIS — G4733 Obstructive sleep apnea (adult) (pediatric): Secondary | ICD-10-CM | POA: Diagnosis not present

## 2017-05-01 NOTE — Telephone Encounter (Signed)
Patient has a 10 week follow up appointment scheduled for Jun 27 2017. Patient understands she needs to keep this appointment for insurance compliance. Patient was grateful for the call and thanked me.

## 2017-05-28 DIAGNOSIS — G4733 Obstructive sleep apnea (adult) (pediatric): Secondary | ICD-10-CM | POA: Diagnosis not present

## 2017-06-08 DIAGNOSIS — H2513 Age-related nuclear cataract, bilateral: Secondary | ICD-10-CM | POA: Diagnosis not present

## 2017-06-08 DIAGNOSIS — E119 Type 2 diabetes mellitus without complications: Secondary | ICD-10-CM | POA: Diagnosis not present

## 2017-06-08 DIAGNOSIS — H524 Presbyopia: Secondary | ICD-10-CM | POA: Diagnosis not present

## 2017-06-27 ENCOUNTER — Encounter: Payer: Self-pay | Admitting: Cardiology

## 2017-06-27 ENCOUNTER — Ambulatory Visit (INDEPENDENT_AMBULATORY_CARE_PROVIDER_SITE_OTHER): Payer: BLUE CROSS/BLUE SHIELD | Admitting: Cardiology

## 2017-06-27 VITALS — BP 148/80 | HR 62 | Ht 67.5 in | Wt 236.8 lb

## 2017-06-27 DIAGNOSIS — Z9989 Dependence on other enabling machines and devices: Secondary | ICD-10-CM | POA: Diagnosis not present

## 2017-06-27 DIAGNOSIS — I10 Essential (primary) hypertension: Secondary | ICD-10-CM | POA: Diagnosis not present

## 2017-06-27 DIAGNOSIS — E669 Obesity, unspecified: Secondary | ICD-10-CM

## 2017-06-27 DIAGNOSIS — G4733 Obstructive sleep apnea (adult) (pediatric): Secondary | ICD-10-CM | POA: Diagnosis not present

## 2017-06-27 HISTORY — DX: Obesity, unspecified: E66.9

## 2017-06-27 HISTORY — DX: Obstructive sleep apnea (adult) (pediatric): Z99.89

## 2017-06-27 HISTORY — DX: Obstructive sleep apnea (adult) (pediatric): G47.33

## 2017-06-27 NOTE — Patient Instructions (Signed)
Medication Instructions:  Your physician recommends that you continue on your current medications as directed. Please refer to the Current Medication list given to you today.   Labwork: none  Testing/Procedures: none  Follow-Up: Your physician wants you to follow-up in: 12 months with Dr. Radford Pax.  You will receive a reminder letter in the mail two months in advance. If you don't receive a letter, please call our office to schedule the follow-up appointment.   Any Other Special Instructions Will Be Listed Below (If Applicable).     If you need a refill on your cardiac medications before your next appointment, please call your pharmacy.
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
## 2017-07-05 ENCOUNTER — Ambulatory Visit: Payer: BLUE CROSS/BLUE SHIELD | Admitting: *Deleted

## 2017-07-05 ENCOUNTER — Telehealth: Payer: Self-pay

## 2017-07-05 NOTE — Telephone Encounter (Signed)
LMOVM reminding pt to send remote transmission.   

## 2017-07-06 ENCOUNTER — Encounter: Payer: Self-pay | Admitting: Cardiology

## 2017-07-10 ENCOUNTER — Ambulatory Visit (INDEPENDENT_AMBULATORY_CARE_PROVIDER_SITE_OTHER): Payer: BLUE CROSS/BLUE SHIELD | Admitting: *Deleted

## 2017-07-10 DIAGNOSIS — I255 Ischemic cardiomyopathy: Secondary | ICD-10-CM

## 2017-07-10 NOTE — Progress Notes (Signed)
Remote ICD transmission.   

## 2017-07-13 ENCOUNTER — Encounter: Payer: Self-pay | Admitting: Cardiology

## 2017-07-26 LAB — CUP PACEART REMOTE DEVICE CHECK
Battery Voltage: 3.13 V
Brady Statistic AP VS Percent: 1 %
Brady Statistic AS VP Percent: 69 %
Brady Statistic RA Percent Paced: 31 %
HighPow Impedance: 70 Ohm
HighPow Impedance: 70 Ohm
Implantable Lead Implant Date: 20181115
Implantable Lead Implant Date: 20181115
Implantable Lead Location: 753858
Implantable Lead Model: 7122
Lead Channel Impedance Value: 450 Ohm
Lead Channel Impedance Value: 660 Ohm
Lead Channel Pacing Threshold Amplitude: 0.625 V
Lead Channel Pacing Threshold Amplitude: 1.125 V
Lead Channel Pacing Threshold Pulse Width: 0.5 ms
Lead Channel Sensing Intrinsic Amplitude: 12 mV
Lead Channel Sensing Intrinsic Amplitude: 4.4 mV
Lead Channel Setting Pacing Amplitude: 1.625
Lead Channel Setting Pacing Amplitude: 2 V
Lead Channel Setting Pacing Pulse Width: 0.5 ms
Lead Channel Setting Pacing Pulse Width: 0.5 ms
Lead Channel Setting Sensing Sensitivity: 0.5 mV
MDC IDC LEAD IMPLANT DT: 20181115
MDC IDC LEAD LOCATION: 753859
MDC IDC LEAD LOCATION: 753860
MDC IDC MSMT BATTERY REMAINING LONGEVITY: 82 mo
MDC IDC MSMT BATTERY REMAINING PERCENTAGE: 91 %
MDC IDC MSMT LEADCHNL RA PACING THRESHOLD PULSEWIDTH: 0.5 ms
MDC IDC MSMT LEADCHNL RV IMPEDANCE VALUE: 440 Ohm
MDC IDC MSMT LEADCHNL RV PACING THRESHOLD AMPLITUDE: 0.625 V
MDC IDC MSMT LEADCHNL RV PACING THRESHOLD PULSEWIDTH: 0.5 ms
MDC IDC PG IMPLANT DT: 20181115
MDC IDC PG SERIAL: 9780956
MDC IDC SESS DTM: 20190525051504
MDC IDC SET LEADCHNL LV PACING AMPLITUDE: 2.125
MDC IDC STAT BRADY AP VP PERCENT: 31 %
MDC IDC STAT BRADY AS VS PERCENT: 1 %

## 2017-07-28 DIAGNOSIS — G4733 Obstructive sleep apnea (adult) (pediatric): Secondary | ICD-10-CM | POA: Diagnosis not present

## 2017-08-24 DIAGNOSIS — Z23 Encounter for immunization: Secondary | ICD-10-CM | POA: Diagnosis not present

## 2017-08-24 DIAGNOSIS — E1140 Type 2 diabetes mellitus with diabetic neuropathy, unspecified: Secondary | ICD-10-CM | POA: Diagnosis not present

## 2017-08-24 DIAGNOSIS — E782 Mixed hyperlipidemia: Secondary | ICD-10-CM | POA: Diagnosis not present

## 2017-08-24 DIAGNOSIS — I10 Essential (primary) hypertension: Secondary | ICD-10-CM | POA: Diagnosis not present

## 2017-08-24 DIAGNOSIS — I2510 Atherosclerotic heart disease of native coronary artery without angina pectoris: Secondary | ICD-10-CM | POA: Diagnosis not present

## 2017-08-24 DIAGNOSIS — E781 Pure hyperglyceridemia: Secondary | ICD-10-CM | POA: Diagnosis not present

## 2017-08-24 DIAGNOSIS — Z125 Encounter for screening for malignant neoplasm of prostate: Secondary | ICD-10-CM | POA: Diagnosis not present

## 2017-08-24 DIAGNOSIS — Z79899 Other long term (current) drug therapy: Secondary | ICD-10-CM | POA: Diagnosis not present

## 2017-08-24 DIAGNOSIS — Z0000 Encounter for general adult medical examination without abnormal findings: Secondary | ICD-10-CM | POA: Diagnosis not present

## 2017-08-27 DIAGNOSIS — G4733 Obstructive sleep apnea (adult) (pediatric): Secondary | ICD-10-CM | POA: Diagnosis not present

## 2017-09-27 DIAGNOSIS — G4733 Obstructive sleep apnea (adult) (pediatric): Secondary | ICD-10-CM | POA: Diagnosis not present

## 2017-10-09 ENCOUNTER — Telehealth: Payer: Self-pay | Admitting: Cardiology

## 2017-10-09 ENCOUNTER — Ambulatory Visit (INDEPENDENT_AMBULATORY_CARE_PROVIDER_SITE_OTHER): Payer: BLUE CROSS/BLUE SHIELD | Admitting: *Deleted

## 2017-10-09 DIAGNOSIS — I255 Ischemic cardiomyopathy: Secondary | ICD-10-CM | POA: Diagnosis not present

## 2017-10-09 DIAGNOSIS — I5022 Chronic systolic (congestive) heart failure: Secondary | ICD-10-CM

## 2017-10-09 NOTE — Telephone Encounter (Signed)
LMOVM reminding pt to send remote transmission.   

## 2017-10-10 NOTE — Progress Notes (Signed)
Remote ICD transmission.   

## 2017-10-11 ENCOUNTER — Encounter: Payer: Self-pay | Admitting: Cardiology

## 2017-10-28 DIAGNOSIS — G4733 Obstructive sleep apnea (adult) (pediatric): Secondary | ICD-10-CM | POA: Diagnosis not present

## 2017-11-03 ENCOUNTER — Other Ambulatory Visit: Payer: Self-pay | Admitting: Interventional Cardiology

## 2017-11-12 LAB — CUP PACEART REMOTE DEVICE CHECK
Battery Remaining Longevity: 78 mo
Battery Voltage: 3.05 V
Brady Statistic AS VS Percent: 1 %
Brady Statistic RA Percent Paced: 28 %
HIGH POWER IMPEDANCE MEASURED VALUE: 72 Ohm
HIGH POWER IMPEDANCE MEASURED VALUE: 72 Ohm
Implantable Lead Implant Date: 20181115
Implantable Lead Implant Date: 20181115
Implantable Lead Location: 753859
Implantable Lead Location: 753860
Implantable Lead Model: 7122
Lead Channel Impedance Value: 410 Ohm
Lead Channel Impedance Value: 610 Ohm
Lead Channel Pacing Threshold Amplitude: 0.625 V
Lead Channel Pacing Threshold Amplitude: 0.625 V
Lead Channel Pacing Threshold Amplitude: 1.125 V
Lead Channel Pacing Threshold Pulse Width: 0.5 ms
Lead Channel Sensing Intrinsic Amplitude: 12 mV
Lead Channel Setting Pacing Amplitude: 2 V
Lead Channel Setting Pacing Amplitude: 2.125
Lead Channel Setting Sensing Sensitivity: 0.5 mV
MDC IDC LEAD IMPLANT DT: 20181115
MDC IDC LEAD LOCATION: 753858
MDC IDC MSMT BATTERY REMAINING PERCENTAGE: 88 %
MDC IDC MSMT LEADCHNL LV PACING THRESHOLD PULSEWIDTH: 0.5 ms
MDC IDC MSMT LEADCHNL RA IMPEDANCE VALUE: 440 Ohm
MDC IDC MSMT LEADCHNL RA SENSING INTR AMPL: 4.7 mV
MDC IDC MSMT LEADCHNL RV PACING THRESHOLD PULSEWIDTH: 0.5 ms
MDC IDC PG IMPLANT DT: 20181115
MDC IDC SESS DTM: 20190828114854
MDC IDC SET LEADCHNL LV PACING PULSEWIDTH: 0.5 ms
MDC IDC SET LEADCHNL RA PACING AMPLITUDE: 1.625
MDC IDC SET LEADCHNL RV PACING PULSEWIDTH: 0.5 ms
MDC IDC STAT BRADY AP VP PERCENT: 28 %
MDC IDC STAT BRADY AP VS PERCENT: 1 %
MDC IDC STAT BRADY AS VP PERCENT: 71 %
Pulse Gen Serial Number: 9780956

## 2017-11-27 DIAGNOSIS — G4733 Obstructive sleep apnea (adult) (pediatric): Secondary | ICD-10-CM | POA: Diagnosis not present

## 2017-12-28 DIAGNOSIS — G4733 Obstructive sleep apnea (adult) (pediatric): Secondary | ICD-10-CM | POA: Diagnosis not present

## 2018-01-08 ENCOUNTER — Ambulatory Visit (INDEPENDENT_AMBULATORY_CARE_PROVIDER_SITE_OTHER): Payer: BLUE CROSS/BLUE SHIELD

## 2018-01-08 ENCOUNTER — Telehealth: Payer: Self-pay | Admitting: Cardiology

## 2018-01-08 DIAGNOSIS — I5022 Chronic systolic (congestive) heart failure: Secondary | ICD-10-CM

## 2018-01-08 DIAGNOSIS — I255 Ischemic cardiomyopathy: Secondary | ICD-10-CM

## 2018-01-08 NOTE — Telephone Encounter (Signed)
LMOVM reminding pt to send remote transmission.   

## 2018-01-09 ENCOUNTER — Encounter: Payer: Self-pay | Admitting: Cardiology

## 2018-01-09 NOTE — Progress Notes (Signed)
Remote ICD transmission.   

## 2018-01-27 DIAGNOSIS — G4733 Obstructive sleep apnea (adult) (pediatric): Secondary | ICD-10-CM | POA: Diagnosis not present

## 2018-02-05 ENCOUNTER — Other Ambulatory Visit: Payer: Self-pay | Admitting: Physician Assistant

## 2018-02-07 ENCOUNTER — Other Ambulatory Visit: Payer: Self-pay | Admitting: Physician Assistant

## 2018-02-14 ENCOUNTER — Other Ambulatory Visit: Payer: Self-pay

## 2018-02-14 MED ORDER — CARVEDILOL 25 MG PO TABS: 25.0000 mg | ORAL_TABLET | Freq: Two times a day (BID) | ORAL | 2 refills | Status: DC

## 2018-02-27 DIAGNOSIS — G4733 Obstructive sleep apnea (adult) (pediatric): Secondary | ICD-10-CM | POA: Diagnosis not present

## 2018-03-01 DIAGNOSIS — I5022 Chronic systolic (congestive) heart failure: Secondary | ICD-10-CM | POA: Diagnosis not present

## 2018-03-01 DIAGNOSIS — I10 Essential (primary) hypertension: Secondary | ICD-10-CM | POA: Diagnosis not present

## 2018-03-01 DIAGNOSIS — E1140 Type 2 diabetes mellitus with diabetic neuropathy, unspecified: Secondary | ICD-10-CM | POA: Diagnosis not present

## 2018-03-01 LAB — CUP PACEART REMOTE DEVICE CHECK
Battery Remaining Longevity: 76 mo
Battery Voltage: 3.02 V
Brady Statistic RA Percent Paced: 28 %
Date Time Interrogation Session: 20191127042736
HIGH POWER IMPEDANCE MEASURED VALUE: 69 Ohm
HighPow Impedance: 69 Ohm
Implantable Lead Implant Date: 20181115
Implantable Lead Implant Date: 20181115
Implantable Lead Implant Date: 20181115
Implantable Lead Location: 753858
Implantable Lead Location: 753860
Lead Channel Impedance Value: 480 Ohm
Lead Channel Impedance Value: 700 Ohm
Lead Channel Pacing Threshold Amplitude: 0.75 V
Lead Channel Pacing Threshold Amplitude: 1.125 V
Lead Channel Pacing Threshold Pulse Width: 0.5 ms
Lead Channel Pacing Threshold Pulse Width: 0.5 ms
Lead Channel Sensing Intrinsic Amplitude: 11.7 mV
Lead Channel Setting Pacing Amplitude: 1.625
Lead Channel Setting Pacing Amplitude: 2.125
Lead Channel Setting Pacing Pulse Width: 0.5 ms
Lead Channel Setting Sensing Sensitivity: 0.5 mV
MDC IDC LEAD LOCATION: 753859
MDC IDC MSMT BATTERY REMAINING PERCENTAGE: 85 %
MDC IDC MSMT LEADCHNL RA PACING THRESHOLD AMPLITUDE: 0.625 V
MDC IDC MSMT LEADCHNL RA SENSING INTR AMPL: 4.8 mV
MDC IDC MSMT LEADCHNL RV IMPEDANCE VALUE: 410 Ohm
MDC IDC MSMT LEADCHNL RV PACING THRESHOLD PULSEWIDTH: 0.5 ms
MDC IDC PG IMPLANT DT: 20181115
MDC IDC SET LEADCHNL LV PACING PULSEWIDTH: 0.5 ms
MDC IDC SET LEADCHNL RV PACING AMPLITUDE: 2 V
MDC IDC STAT BRADY AP VP PERCENT: 28 %
MDC IDC STAT BRADY AP VS PERCENT: 1 %
MDC IDC STAT BRADY AS VP PERCENT: 72 %
MDC IDC STAT BRADY AS VS PERCENT: 1 %
Pulse Gen Serial Number: 9780956

## 2018-03-30 DIAGNOSIS — G4733 Obstructive sleep apnea (adult) (pediatric): Secondary | ICD-10-CM | POA: Diagnosis not present

## 2018-04-09 ENCOUNTER — Ambulatory Visit (INDEPENDENT_AMBULATORY_CARE_PROVIDER_SITE_OTHER): Payer: BLUE CROSS/BLUE SHIELD | Admitting: *Deleted

## 2018-04-09 DIAGNOSIS — I255 Ischemic cardiomyopathy: Secondary | ICD-10-CM | POA: Diagnosis not present

## 2018-04-09 DIAGNOSIS — I5022 Chronic systolic (congestive) heart failure: Secondary | ICD-10-CM

## 2018-04-10 LAB — CUP PACEART REMOTE DEVICE CHECK
Battery Remaining Longevity: 74 mo
Battery Remaining Percentage: 82 %
Brady Statistic AS VP Percent: 69 %
Brady Statistic AS VS Percent: 1 %
Date Time Interrogation Session: 20200225105350
HIGH POWER IMPEDANCE MEASURED VALUE: 73 Ohm
HIGH POWER IMPEDANCE MEASURED VALUE: 73 Ohm
Implantable Lead Implant Date: 20181115
Implantable Lead Implant Date: 20181115
Implantable Lead Location: 753859
Implantable Pulse Generator Implant Date: 20181115
Lead Channel Impedance Value: 480 Ohm
Lead Channel Pacing Threshold Amplitude: 0.625 V
Lead Channel Pacing Threshold Pulse Width: 0.5 ms
Lead Channel Pacing Threshold Pulse Width: 0.5 ms
Lead Channel Sensing Intrinsic Amplitude: 4.6 mV
Lead Channel Setting Pacing Amplitude: 2 V
Lead Channel Setting Pacing Amplitude: 2 V
Lead Channel Setting Sensing Sensitivity: 0.5 mV
MDC IDC LEAD IMPLANT DT: 20181115
MDC IDC LEAD LOCATION: 753858
MDC IDC LEAD LOCATION: 753860
MDC IDC MSMT BATTERY VOLTAGE: 2.99 V
MDC IDC MSMT LEADCHNL LV IMPEDANCE VALUE: 710 Ohm
MDC IDC MSMT LEADCHNL LV PACING THRESHOLD AMPLITUDE: 1 V
MDC IDC MSMT LEADCHNL RA IMPEDANCE VALUE: 490 Ohm
MDC IDC MSMT LEADCHNL RA PACING THRESHOLD AMPLITUDE: 0.5 V
MDC IDC MSMT LEADCHNL RV PACING THRESHOLD PULSEWIDTH: 0.5 ms
MDC IDC MSMT LEADCHNL RV SENSING INTR AMPL: 11.7 mV
MDC IDC SET LEADCHNL LV PACING PULSEWIDTH: 0.5 ms
MDC IDC SET LEADCHNL RA PACING AMPLITUDE: 1.5 V
MDC IDC SET LEADCHNL RV PACING PULSEWIDTH: 0.5 ms
MDC IDC STAT BRADY AP VP PERCENT: 31 %
MDC IDC STAT BRADY AP VS PERCENT: 1 %
MDC IDC STAT BRADY RA PERCENT PACED: 31 %
Pulse Gen Serial Number: 9780956

## 2018-04-17 ENCOUNTER — Encounter: Payer: Self-pay | Admitting: Cardiology

## 2018-04-17 NOTE — Progress Notes (Signed)
Remote ICD transmission.   

## 2018-05-03 DIAGNOSIS — I10 Essential (primary) hypertension: Secondary | ICD-10-CM | POA: Diagnosis not present

## 2018-05-03 DIAGNOSIS — R05 Cough: Secondary | ICD-10-CM | POA: Diagnosis not present

## 2018-05-03 DIAGNOSIS — M7712 Lateral epicondylitis, left elbow: Secondary | ICD-10-CM | POA: Diagnosis not present

## 2018-05-03 DIAGNOSIS — M79602 Pain in left arm: Secondary | ICD-10-CM | POA: Diagnosis not present

## 2018-07-09 ENCOUNTER — Encounter: Payer: BLUE CROSS/BLUE SHIELD | Admitting: *Deleted

## 2018-07-10 ENCOUNTER — Telehealth: Payer: Self-pay

## 2018-07-10 NOTE — Telephone Encounter (Signed)
Left message for patient to remind of missed remote transmission.  

## 2018-07-13 ENCOUNTER — Other Ambulatory Visit: Payer: Self-pay | Admitting: Interventional Cardiology

## 2018-07-23 DIAGNOSIS — E119 Type 2 diabetes mellitus without complications: Secondary | ICD-10-CM | POA: Diagnosis not present

## 2018-07-23 DIAGNOSIS — H2513 Age-related nuclear cataract, bilateral: Secondary | ICD-10-CM | POA: Diagnosis not present

## 2018-08-09 ENCOUNTER — Other Ambulatory Visit: Payer: Self-pay | Admitting: Interventional Cardiology

## 2018-08-30 DIAGNOSIS — Z0000 Encounter for general adult medical examination without abnormal findings: Secondary | ICD-10-CM | POA: Diagnosis not present

## 2018-09-02 ENCOUNTER — Other Ambulatory Visit: Payer: Self-pay | Admitting: Interventional Cardiology

## 2018-09-03 DIAGNOSIS — Z79899 Other long term (current) drug therapy: Secondary | ICD-10-CM | POA: Diagnosis not present

## 2018-09-03 DIAGNOSIS — E782 Mixed hyperlipidemia: Secondary | ICD-10-CM | POA: Diagnosis not present

## 2018-09-03 DIAGNOSIS — E1140 Type 2 diabetes mellitus with diabetic neuropathy, unspecified: Secondary | ICD-10-CM | POA: Diagnosis not present

## 2018-09-03 DIAGNOSIS — Z125 Encounter for screening for malignant neoplasm of prostate: Secondary | ICD-10-CM | POA: Diagnosis not present

## 2018-09-27 DIAGNOSIS — D2262 Melanocytic nevi of left upper limb, including shoulder: Secondary | ICD-10-CM | POA: Diagnosis not present

## 2018-09-27 DIAGNOSIS — D2261 Melanocytic nevi of right upper limb, including shoulder: Secondary | ICD-10-CM | POA: Diagnosis not present

## 2018-09-27 DIAGNOSIS — L821 Other seborrheic keratosis: Secondary | ICD-10-CM | POA: Diagnosis not present

## 2018-09-27 DIAGNOSIS — D0461 Carcinoma in situ of skin of right upper limb, including shoulder: Secondary | ICD-10-CM | POA: Diagnosis not present

## 2018-09-27 DIAGNOSIS — D225 Melanocytic nevi of trunk: Secondary | ICD-10-CM | POA: Diagnosis not present

## 2018-09-29 ENCOUNTER — Other Ambulatory Visit: Payer: Self-pay | Admitting: Interventional Cardiology

## 2018-10-03 DIAGNOSIS — D0461 Carcinoma in situ of skin of right upper limb, including shoulder: Secondary | ICD-10-CM | POA: Diagnosis not present

## 2018-10-27 ENCOUNTER — Other Ambulatory Visit: Payer: Self-pay | Admitting: Interventional Cardiology

## 2018-11-13 ENCOUNTER — Other Ambulatory Visit: Payer: Self-pay | Admitting: Interventional Cardiology

## 2018-11-21 ENCOUNTER — Ambulatory Visit (INDEPENDENT_AMBULATORY_CARE_PROVIDER_SITE_OTHER): Payer: BC Managed Care – PPO | Admitting: Internal Medicine

## 2018-11-21 ENCOUNTER — Other Ambulatory Visit: Payer: Self-pay

## 2018-11-21 ENCOUNTER — Encounter: Payer: Self-pay | Admitting: Internal Medicine

## 2018-11-21 VITALS — BP 132/70 | HR 60 | Ht 67.5 in | Wt 207.0 lb

## 2018-11-21 DIAGNOSIS — I2510 Atherosclerotic heart disease of native coronary artery without angina pectoris: Secondary | ICD-10-CM | POA: Diagnosis not present

## 2018-11-21 DIAGNOSIS — I5022 Chronic systolic (congestive) heart failure: Secondary | ICD-10-CM | POA: Diagnosis not present

## 2018-11-21 DIAGNOSIS — Z95810 Presence of automatic (implantable) cardiac defibrillator: Secondary | ICD-10-CM | POA: Diagnosis not present
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
## 2018-11-21 NOTE — Patient Instructions (Addendum)
Medication Instructions:  Your physician recommends that you continue on your current medications as directed. Please refer to the Current Medication list given to you today.  Labwork: None ordered.  Testing/Procedures: None ordered.  Follow-Up: Your physician wants you to follow-up in: one year with Dr. Lovena Le.   You will receive a reminder letter in the mail two months in advance. If you don't receive a letter, please call our office to schedule the follow-up appointment.  Remote monitoring is used to monitor your ICD from home. This monitoring reduces the number of office visits required to check your device to one time per year. It allows [place] to keep an eye on the functioning of your device to ensure it is working properly. You are scheduled for a device check from home on 02/19/2018. You may send your transmission at any time that day. If you have a wireless device, the transmission will be sent automatically. After your physician reviews your transmission, you will receive a postcard with your next transmission date.  Any Other Special Instructions Will Be Listed Below (If Applicable).  If you need a refill on your cardiac medications before your next appointment, please call your pharmacy.

## 2018-11-22 ENCOUNTER — Ambulatory Visit (INDEPENDENT_AMBULATORY_CARE_PROVIDER_SITE_OTHER): Payer: BC Managed Care – PPO | Admitting: *Deleted

## 2018-11-22 DIAGNOSIS — I5022 Chronic systolic (congestive) heart failure: Secondary | ICD-10-CM

## 2018-11-22 DIAGNOSIS — I255 Ischemic cardiomyopathy: Secondary | ICD-10-CM

## 2018-11-22 LAB — CUP PACEART REMOTE DEVICE CHECK
Battery Remaining Longevity: 70 mo
Battery Remaining Percentage: 75 %
Battery Voltage: 2.98 V
Brady Statistic AP VP Percent: 11 %
Brady Statistic AP VS Percent: 1 %
Brady Statistic AS VP Percent: 88 %
Brady Statistic AS VS Percent: 1 %
Brady Statistic RA Percent Paced: 12 %
Date Time Interrogation Session: 20201009055025
HighPow Impedance: 74 Ohm
HighPow Impedance: 78 Ohm
Implantable Lead Implant Date: 20181115
Implantable Lead Implant Date: 20181115
Implantable Lead Implant Date: 20181115
Implantable Lead Location: 753858
Implantable Lead Location: 753859
Implantable Lead Location: 753860
Implantable Lead Model: 7122
Implantable Pulse Generator Implant Date: 20181115
Lead Channel Impedance Value: 450 Ohm
Lead Channel Impedance Value: 510 Ohm
Lead Channel Impedance Value: 740 Ohm
Lead Channel Pacing Threshold Amplitude: 0.5 V
Lead Channel Pacing Threshold Amplitude: 0.625 V
Lead Channel Pacing Threshold Amplitude: 0.875 V
Lead Channel Pacing Threshold Pulse Width: 0.5 ms
Lead Channel Pacing Threshold Pulse Width: 0.5 ms
Lead Channel Pacing Threshold Pulse Width: 0.5 ms
Lead Channel Sensing Intrinsic Amplitude: 11.7 mV
Lead Channel Sensing Intrinsic Amplitude: 4.3 mV
Lead Channel Setting Pacing Amplitude: 1.5 V
Lead Channel Setting Pacing Amplitude: 2 V
Lead Channel Setting Pacing Amplitude: 2 V
Lead Channel Setting Pacing Pulse Width: 0.5 ms
Lead Channel Setting Pacing Pulse Width: 0.5 ms
Lead Channel Setting Sensing Sensitivity: 0.5 mV
Pulse Gen Serial Number: 9780956

## 2018-11-28 ENCOUNTER — Other Ambulatory Visit: Payer: Self-pay | Admitting: Interventional Cardiology

## 2018-12-03 NOTE — Progress Notes (Signed)
Remote ICD transmission.   
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
## 2018-12-13 ENCOUNTER — Ambulatory Visit (INDEPENDENT_AMBULATORY_CARE_PROVIDER_SITE_OTHER): Payer: BC Managed Care – PPO | Admitting: Interventional Cardiology

## 2018-12-13 ENCOUNTER — Encounter: Payer: Self-pay | Admitting: Interventional Cardiology

## 2018-12-13 ENCOUNTER — Other Ambulatory Visit: Payer: Self-pay

## 2018-12-13 VITALS — BP 128/70 | HR 63 | Ht 67.0 in | Wt 208.0 lb

## 2018-12-13 DIAGNOSIS — Z9989 Dependence on other enabling machines and devices: Secondary | ICD-10-CM

## 2018-12-13 DIAGNOSIS — I5022 Chronic systolic (congestive) heart failure: Secondary | ICD-10-CM | POA: Diagnosis not present

## 2018-12-13 DIAGNOSIS — I2510 Atherosclerotic heart disease of native coronary artery without angina pectoris: Secondary | ICD-10-CM | POA: Diagnosis not present

## 2018-12-13 DIAGNOSIS — I359 Nonrheumatic aortic valve disorder, unspecified: Secondary | ICD-10-CM | POA: Diagnosis not present

## 2018-12-13 DIAGNOSIS — I10 Essential (primary) hypertension: Secondary | ICD-10-CM

## 2018-12-13 DIAGNOSIS — Z95810 Presence of automatic (implantable) cardiac defibrillator: Secondary | ICD-10-CM | POA: Diagnosis not present

## 2018-12-13 DIAGNOSIS — G4733 Obstructive sleep apnea (adult) (pediatric): Secondary | ICD-10-CM

## 2018-12-13 DIAGNOSIS — Z7189 Other specified counseling: Secondary | ICD-10-CM

## 2018-12-13 MED ORDER — CARVEDILOL 25 MG PO TABS: 25.0000 mg | ORAL_TABLET | Freq: Two times a day (BID) | ORAL | 3 refills | Status: DC

## 2018-12-13 NOTE — Patient Instructions (Addendum)
Medication Instructions:  1) DISCONTINUE Amlodipine 2) INCREASE Carvedilol to 25mg  twice daily  *If you need a refill on your cardiac medications before your next appointment, please call your pharmacy*  Lab Work: None If you have labs (blood work) drawn today and your tests are completely normal, you will receive your results only by: [name] MyChart Message (if you have MyChart) OR . A paper copy in the mail If you have any lab test that is abnormal or we need to change your treatment, we will call you to review the results.  Testing/Procedures: None  Follow-Up: At [facility], you and your health needs are our priority.  As part of our continuing mission to provide you with exceptional heart care, we have created designated Provider Care Teams.  These Care Teams include your primary Cardiologist (physician) and Advanced Practice Providers (APPs -  Physician Assistants and Nurse Practitioners) who all work together to provide you with the care you need, when you need it.  Your next appointment:   12 months  The format for your next appointment:   In Person  Provider:   You may see Sinclair Grooms, MD or one of the following Advanced Practice Providers on your designated Care Team:    Truitt Merle, NP  Cecilie Kicks, NP  Kathyrn Drown, NP   Other Instructions  Please monitor your blood pressure 2-3 times per week for three weeks and contact our office with those readings.  Check your blood pressure at least two hours after your morning medications. Please contact the office if your blood pressure readings are consistently over 130/80.

## 2018-12-27 ENCOUNTER — Other Ambulatory Visit: Payer: Self-pay | Admitting: Interventional Cardiology

## 2019-02-21 ENCOUNTER — Ambulatory Visit (INDEPENDENT_AMBULATORY_CARE_PROVIDER_SITE_OTHER): Payer: BC Managed Care – PPO | Admitting: *Deleted

## 2019-02-21 DIAGNOSIS — I5022 Chronic systolic (congestive) heart failure: Secondary | ICD-10-CM

## 2019-02-21 LAB — CUP PACEART REMOTE DEVICE CHECK
Battery Remaining Longevity: 65 mo
Battery Remaining Percentage: 72 %
Battery Voltage: 2.96 V
Brady Statistic AP VP Percent: 67 %
Brady Statistic AP VS Percent: 1 %
Brady Statistic AS VP Percent: 33 %
Brady Statistic AS VS Percent: 1 %
Brady Statistic RA Percent Paced: 67 %
Date Time Interrogation Session: 20210108035044
HighPow Impedance: 75 Ohm
HighPow Impedance: 75 Ohm
Implantable Lead Implant Date: 20181115
Implantable Lead Implant Date: 20181115
Implantable Lead Implant Date: 20181115
Implantable Lead Location: 753858
Implantable Lead Location: 753859
Implantable Lead Location: 753860
Implantable Lead Model: 7122
Implantable Pulse Generator Implant Date: 20181115
Lead Channel Impedance Value: 460 Ohm
Lead Channel Impedance Value: 490 Ohm
Lead Channel Impedance Value: 730 Ohm
Lead Channel Pacing Threshold Amplitude: 0.625 V
Lead Channel Pacing Threshold Amplitude: 0.75 V
Lead Channel Pacing Threshold Amplitude: 0.875 V
Lead Channel Pacing Threshold Pulse Width: 0.5 ms
Lead Channel Pacing Threshold Pulse Width: 0.5 ms
Lead Channel Pacing Threshold Pulse Width: 0.5 ms
Lead Channel Sensing Intrinsic Amplitude: 11.7 mV
Lead Channel Sensing Intrinsic Amplitude: 4.2 mV
Lead Channel Setting Pacing Amplitude: 1.625
Lead Channel Setting Pacing Amplitude: 2 V
Lead Channel Setting Pacing Amplitude: 2 V
Lead Channel Setting Pacing Pulse Width: 0.5 ms
Lead Channel Setting Pacing Pulse Width: 0.5 ms
Lead Channel Setting Sensing Sensitivity: 0.5 mV
Pulse Gen Serial Number: 9780956

## 2019-02-24 LAB — CUP PACEART INCLINIC DEVICE CHECK
Date Time Interrogation Session: 20201008134258
Implantable Lead Implant Date: 20181115
Implantable Lead Implant Date: 20181115
Implantable Lead Implant Date: 20181115
Implantable Lead Location: 753858
Implantable Lead Location: 753859
Implantable Lead Location: 753860
Implantable Lead Model: 7122
Implantable Pulse Generator Implant Date: 20181115
Pulse Gen Serial Number: 9780956

## 2019-03-04 DIAGNOSIS — E1140 Type 2 diabetes mellitus with diabetic neuropathy, unspecified: Secondary | ICD-10-CM | POA: Diagnosis not present

## 2019-03-04 DIAGNOSIS — N1832 Chronic kidney disease, stage 3b: Secondary | ICD-10-CM | POA: Diagnosis not present

## 2019-03-04 DIAGNOSIS — E1121 Type 2 diabetes mellitus with diabetic nephropathy: Secondary | ICD-10-CM | POA: Diagnosis not present

## 2019-03-04 DIAGNOSIS — I129 Hypertensive chronic kidney disease with stage 1 through stage 4 chronic kidney disease, or unspecified chronic kidney disease: Secondary | ICD-10-CM | POA: Diagnosis not present

## 2019-05-23 ENCOUNTER — Ambulatory Visit (INDEPENDENT_AMBULATORY_CARE_PROVIDER_SITE_OTHER): Payer: BC Managed Care – PPO | Admitting: *Deleted

## 2019-05-23 DIAGNOSIS — I5022 Chronic systolic (congestive) heart failure: Secondary | ICD-10-CM | POA: Diagnosis not present

## 2019-05-23 LAB — CUP PACEART REMOTE DEVICE CHECK
Battery Remaining Longevity: 61 mo
Battery Remaining Percentage: 69 %
Battery Voltage: 2.96 V
Brady Statistic AP VP Percent: 66 %
Brady Statistic AP VS Percent: 1 %
Brady Statistic AS VP Percent: 34 %
Brady Statistic AS VS Percent: 1 %
Brady Statistic RA Percent Paced: 66 %
Date Time Interrogation Session: 20210409020005
HighPow Impedance: 73 Ohm
HighPow Impedance: 73 Ohm
Implantable Lead Implant Date: 20181115
Implantable Lead Implant Date: 20181115
Implantable Lead Implant Date: 20181115
Implantable Lead Location: 753858
Implantable Lead Location: 753859
Implantable Lead Location: 753860
Implantable Lead Model: 7122
Implantable Pulse Generator Implant Date: 20181115
Lead Channel Impedance Value: 460 Ohm
Lead Channel Impedance Value: 530 Ohm
Lead Channel Impedance Value: 790 Ohm
Lead Channel Pacing Threshold Amplitude: 0.5 V
Lead Channel Pacing Threshold Amplitude: 0.75 V
Lead Channel Pacing Threshold Amplitude: 1 V
Lead Channel Pacing Threshold Pulse Width: 0.5 ms
Lead Channel Pacing Threshold Pulse Width: 0.5 ms
Lead Channel Pacing Threshold Pulse Width: 0.5 ms
Lead Channel Sensing Intrinsic Amplitude: 11.7 mV
Lead Channel Sensing Intrinsic Amplitude: 5 mV
Lead Channel Setting Pacing Amplitude: 1.5 V
Lead Channel Setting Pacing Amplitude: 2 V
Lead Channel Setting Pacing Amplitude: 2 V
Lead Channel Setting Pacing Pulse Width: 0.5 ms
Lead Channel Setting Pacing Pulse Width: 0.5 ms
Lead Channel Setting Sensing Sensitivity: 0.5 mV
Pulse Gen Serial Number: 9780956

## 2019-05-23 NOTE — Progress Notes (Signed)
ICD Remote  

## 2019-08-22 ENCOUNTER — Ambulatory Visit (INDEPENDENT_AMBULATORY_CARE_PROVIDER_SITE_OTHER): Payer: Medicare Other | Admitting: *Deleted

## 2019-08-22 DIAGNOSIS — I255 Ischemic cardiomyopathy: Secondary | ICD-10-CM | POA: Diagnosis not present

## 2019-08-22 LAB — CUP PACEART REMOTE DEVICE CHECK
Battery Remaining Longevity: 61 mo
Battery Remaining Percentage: 67 %
Battery Voltage: 2.96 V
Brady Statistic AP VP Percent: 63 %
Brady Statistic AP VS Percent: 1 %
Brady Statistic AS VP Percent: 36 %
Brady Statistic AS VS Percent: 1 %
Brady Statistic RA Percent Paced: 63 %
Date Time Interrogation Session: 20210709035356
HighPow Impedance: 79 Ohm
HighPow Impedance: 79 Ohm
Implantable Lead Implant Date: 20181115
Implantable Lead Implant Date: 20181115
Implantable Lead Implant Date: 20181115
Implantable Lead Location: 753858
Implantable Lead Location: 753859
Implantable Lead Location: 753860
Implantable Lead Model: 7122
Implantable Pulse Generator Implant Date: 20181115
Lead Channel Impedance Value: 450 Ohm
Lead Channel Impedance Value: 540 Ohm
Lead Channel Impedance Value: 810 Ohm
Lead Channel Pacing Threshold Amplitude: 0.5 V
Lead Channel Pacing Threshold Amplitude: 0.75 V
Lead Channel Pacing Threshold Amplitude: 0.875 V
Lead Channel Pacing Threshold Pulse Width: 0.5 ms
Lead Channel Pacing Threshold Pulse Width: 0.5 ms
Lead Channel Pacing Threshold Pulse Width: 0.5 ms
Lead Channel Sensing Intrinsic Amplitude: 12 mV
Lead Channel Sensing Intrinsic Amplitude: 4.7 mV
Lead Channel Setting Pacing Amplitude: 1.5 V
Lead Channel Setting Pacing Amplitude: 2 V
Lead Channel Setting Pacing Amplitude: 2 V
Lead Channel Setting Pacing Pulse Width: 0.5 ms
Lead Channel Setting Pacing Pulse Width: 0.5 ms
Lead Channel Setting Sensing Sensitivity: 0.5 mV
Pulse Gen Serial Number: 9780956

## 2019-08-26 NOTE — Progress Notes (Signed)
Remote ICD transmission.   

## 2019-09-26 ENCOUNTER — Ambulatory Visit: Payer: Medicare Other | Admitting: Cardiology

## 2019-11-18 ENCOUNTER — Ambulatory Visit: Payer: Medicare Other | Admitting: Cardiology

## 2019-11-20 ENCOUNTER — Other Ambulatory Visit: Payer: Self-pay | Admitting: Interventional Cardiology

## 2019-11-22 LAB — CUP PACEART REMOTE DEVICE CHECK
Battery Remaining Longevity: 58 mo
Battery Remaining Percentage: 64 %
Battery Voltage: 2.96 V
Brady Statistic AP VP Percent: 60 %
Brady Statistic AP VS Percent: 1 %
Brady Statistic AS VP Percent: 40 %
Brady Statistic AS VS Percent: 1 %
Brady Statistic RA Percent Paced: 60 %
Date Time Interrogation Session: 20211009042655
HighPow Impedance: 77 Ohm
HighPow Impedance: 77 Ohm
Implantable Lead Implant Date: 20181115
Implantable Lead Implant Date: 20181115
Implantable Lead Implant Date: 20181115
Implantable Lead Location: 753858
Implantable Lead Location: 753859
Implantable Lead Location: 753860
Implantable Lead Model: 7122
Implantable Pulse Generator Implant Date: 20181115
Lead Channel Impedance Value: 490 Ohm
Lead Channel Impedance Value: 530 Ohm
Lead Channel Impedance Value: 750 Ohm
Lead Channel Pacing Threshold Amplitude: 0.625 V
Lead Channel Pacing Threshold Amplitude: 0.75 V
Lead Channel Pacing Threshold Amplitude: 0.875 V
Lead Channel Pacing Threshold Pulse Width: 0.5 ms
Lead Channel Pacing Threshold Pulse Width: 0.5 ms
Lead Channel Pacing Threshold Pulse Width: 0.5 ms
Lead Channel Sensing Intrinsic Amplitude: 11.7 mV
Lead Channel Sensing Intrinsic Amplitude: 4.6 mV
Lead Channel Setting Pacing Amplitude: 1.625
Lead Channel Setting Pacing Amplitude: 2 V
Lead Channel Setting Pacing Amplitude: 2 V
Lead Channel Setting Pacing Pulse Width: 0.5 ms
Lead Channel Setting Pacing Pulse Width: 0.5 ms
Lead Channel Setting Sensing Sensitivity: 0.5 mV
Pulse Gen Serial Number: 9780956

## 2019-11-24 ENCOUNTER — Ambulatory Visit (INDEPENDENT_AMBULATORY_CARE_PROVIDER_SITE_OTHER): Payer: Medicare Other

## 2019-11-24 DIAGNOSIS — I255 Ischemic cardiomyopathy: Secondary | ICD-10-CM

## 2019-11-25 ENCOUNTER — Encounter: Payer: Self-pay | Admitting: Internal Medicine

## 2019-11-25 ENCOUNTER — Ambulatory Visit (INDEPENDENT_AMBULATORY_CARE_PROVIDER_SITE_OTHER): Payer: Medicare Other | Admitting: Internal Medicine

## 2019-11-25 ENCOUNTER — Other Ambulatory Visit: Payer: Self-pay

## 2019-11-25 VITALS — BP 144/70 | HR 62 | Ht 67.0 in | Wt 212.6 lb

## 2019-11-25 DIAGNOSIS — I2510 Atherosclerotic heart disease of native coronary artery without angina pectoris: Secondary | ICD-10-CM | POA: Diagnosis not present

## 2019-11-25 DIAGNOSIS — I255 Ischemic cardiomyopathy: Secondary | ICD-10-CM

## 2019-11-25 DIAGNOSIS — I5022 Chronic systolic (congestive) heart failure: Secondary | ICD-10-CM | POA: Diagnosis not present

## 2019-11-25 DIAGNOSIS — Z95810 Presence of automatic (implantable) cardiac defibrillator: Secondary | ICD-10-CM | POA: Diagnosis not present
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
## 2019-11-25 NOTE — Patient Instructions (Signed)
Medication Instructions:  Your physician recommends that you continue on your current medications as directed. Please refer to the Current Medication list given to you today.  Labwork: None ordered.  Testing/Procedures: None ordered.  Follow-Up: Your physician wants you to follow-up in: one year with Dr. Lovena Le.   You will receive a reminder letter in the mail two months in advance. If you don't receive a letter, please call our office to schedule the follow-up appointment.  Remote monitoring is used to monitor your ICD from home. This monitoring reduces the number of office visits required to check your device to one time per year. It allows [place] to keep an eye on the functioning of your device to ensure it is working properly. You are scheduled for a device check from home on 02/23/2020. You may send your transmission at any time that day. If you have a wireless device, the transmission will be sent automatically. After your physician reviews your transmission, you will receive a postcard with your next transmission date.  Any Other Special Instructions Will Be Listed Below (If Applicable).  If you need a refill on your cardiac medications before your next appointment, please call your pharmacy.

## 2019-11-26 NOTE — Progress Notes (Signed)
Remote ICD transmission.   

## 2019-12-01 ENCOUNTER — Telehealth: Payer: Self-pay | Admitting: *Deleted

## 2019-12-01 ENCOUNTER — Other Ambulatory Visit: Payer: Self-pay

## 2019-12-01 ENCOUNTER — Encounter: Payer: Self-pay | Admitting: Cardiology

## 2019-12-01 ENCOUNTER — Ambulatory Visit (INDEPENDENT_AMBULATORY_CARE_PROVIDER_SITE_OTHER): Payer: Medicare Other | Admitting: Cardiology

## 2019-12-01 ENCOUNTER — Telehealth: Payer: Self-pay

## 2019-12-01 VITALS — BP 130/60 | HR 60 | Ht 67.0 in | Wt 214.0 lb

## 2019-12-01 DIAGNOSIS — I255 Ischemic cardiomyopathy: Secondary | ICD-10-CM | POA: Diagnosis not present

## 2019-12-01 DIAGNOSIS — G4733 Obstructive sleep apnea (adult) (pediatric): Secondary | ICD-10-CM

## 2019-12-01 DIAGNOSIS — E669 Obesity, unspecified: Secondary | ICD-10-CM | POA: Diagnosis not present

## 2019-12-01 DIAGNOSIS — Z9989 Dependence on other enabling machines and devices: Secondary | ICD-10-CM

## 2019-12-01 DIAGNOSIS — I10 Essential (primary) hypertension: Secondary | ICD-10-CM | POA: Diagnosis not present

## 2019-12-01 NOTE — Patient Instructions (Signed)
Medication Instructions:  Your physician recommends that you continue on your current medications as directed. Please refer to the Current Medication list given to you today.  *If you need a refill on your cardiac medications before your next appointment, please call your pharmacy*  Follow-Up: At [facility], you and your health needs are our priority.  As part of our continuing mission to provide you with exceptional heart care, we have created designated Provider Care Teams.  These Care Teams include your primary Cardiologist (physician) and Advanced Practice Providers (APPs -  Physician Assistants and Nurse Practitioners) who all work together to provide you with the care you need, when you need it.  Your next appointment:   2 month(s)  The format for your next appointment:   Virtual Visit   Provider:   Fransico Him, MD

## 2019-12-01 NOTE — Telephone Encounter (Signed)
-----   Message from Antonieta Iba, RN sent at 12/01/2019  9:57 AM EDT ----- Dr. Radford Pax would like this patient referred to their DME for a formal mask fitting.  Thanks!

## 2019-12-01 NOTE — Telephone Encounter (Signed)
°  Patient Consent for Virtual Visit         Kenneth Giles has provided verbal consent on 12/01/2019 for a virtual visit (video or telephone).   CONSENT FOR VIRTUAL VISIT FOR:  Kenneth Giles  By participating in this virtual visit I agree to the following:  I hereby voluntarily request, consent and authorize [facility] and its employed or contracted physicians, physician assistants, nurse practitioners or other licensed health care professionals (the Practitioner), to provide me with telemedicine health care services (the Services") as deemed necessary by the treating Practitioner. I acknowledge and consent to receive the Services by the Practitioner via telemedicine. I understand that the telemedicine visit will involve communicating with the Practitioner through live audiovisual communication technology and the disclosure of certain medical information by electronic transmission. I acknowledge that I have been given the opportunity to request an in-person assessment or other available alternative prior to the telemedicine visit and am voluntarily participating in the telemedicine visit.  I understand that I have the right to withhold or withdraw my consent to the use of telemedicine in the course of my care at any time, without affecting my right to future care or treatment, and that the Practitioner or I may terminate the telemedicine visit at any time. I understand that I have the right to inspect all information obtained and/or recorded in the course of the telemedicine visit and may receive copies of available information for a reasonable fee.  I understand that some of the potential risks of receiving the Services via telemedicine include:   Delay or interruption in medical evaluation due to technological equipment failure or disruption;  Information transmitted may not be sufficient (e.g. poor resolution of images) to allow for appropriate medical decision making by the  Practitioner; and/or   In rare instances, security protocols could fail, causing a breach of personal health information.  Furthermore, I acknowledge that it is my responsibility to provide information about my medical history, conditions and care that is complete and accurate to the best of my ability. I acknowledge that Practitioner's advice, recommendations, and/or decision may be based on factors not within their control, such as incomplete or inaccurate data provided by me or distortions of diagnostic images or specimens that may result from electronic transmissions. I understand that the practice of medicine is not an exact science and that Practitioner makes no warranties or guarantees regarding treatment outcomes. I acknowledge that a copy of this consent can be made available to me via my patient portal ([facility]), or I can request a printed copy by calling the office of [facility].    I understand that my insurance will be billed for this visit.   I have read or had this consent read to me.  I understand the contents of this consent, which adequately explains the benefits and risks of the Services being provided via telemedicine.   I have been provided ample opportunity to ask questions regarding this consent and the Services and have had my questions answered to my satisfaction.  I give my informed consent for the services to be provided through the use of telemedicine in my medical care
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
## 2019-12-02 NOTE — Telephone Encounter (Signed)
[facility] does not perform mask fitting so patient has an appointment at the sleep lab for a mask fit 12/16/19 at 3p.

## 2019-12-08 ENCOUNTER — Other Ambulatory Visit: Payer: Self-pay | Admitting: Interventional Cardiology

## 2019-12-16 ENCOUNTER — Other Ambulatory Visit: Payer: Self-pay

## 2019-12-16 ENCOUNTER — Ambulatory Visit (HOSPITAL_BASED_OUTPATIENT_CLINIC_OR_DEPARTMENT_OTHER): Payer: Medicare Other | Attending: Cardiology | Admitting: Cardiology

## 2019-12-16 DIAGNOSIS — Z9989 Dependence on other enabling machines and devices: Secondary | ICD-10-CM

## 2019-12-16 DIAGNOSIS — G4733 Obstructive sleep apnea (adult) (pediatric): Secondary | ICD-10-CM

## 2020-01-05 ENCOUNTER — Ambulatory Visit (INDEPENDENT_AMBULATORY_CARE_PROVIDER_SITE_OTHER): Payer: Medicare Other | Admitting: Interventional Cardiology

## 2020-01-05 ENCOUNTER — Encounter: Payer: Self-pay | Admitting: Interventional Cardiology

## 2020-01-05 ENCOUNTER — Other Ambulatory Visit: Payer: Self-pay

## 2020-01-05 VITALS — BP 140/80 | HR 66 | Ht 66.0 in | Wt 210.0 lb

## 2020-01-05 DIAGNOSIS — Z95810 Presence of automatic (implantable) cardiac defibrillator: Secondary | ICD-10-CM | POA: Diagnosis not present

## 2020-01-05 DIAGNOSIS — I2510 Atherosclerotic heart disease of native coronary artery without angina pectoris: Secondary | ICD-10-CM

## 2020-01-05 DIAGNOSIS — G4733 Obstructive sleep apnea (adult) (pediatric): Secondary | ICD-10-CM

## 2020-01-05 DIAGNOSIS — I10 Essential (primary) hypertension: Secondary | ICD-10-CM

## 2020-01-05 DIAGNOSIS — I5022 Chronic systolic (congestive) heart failure: Secondary | ICD-10-CM

## 2020-01-05 DIAGNOSIS — Z7189 Other specified counseling: Secondary | ICD-10-CM

## 2020-01-05 DIAGNOSIS — Z9989 Dependence on other enabling machines and devices: Secondary | ICD-10-CM

## 2020-01-05 DIAGNOSIS — I359 Nonrheumatic aortic valve disorder, unspecified: Secondary | ICD-10-CM

## 2020-01-05 DIAGNOSIS — I255 Ischemic cardiomyopathy: Secondary | ICD-10-CM | POA: Diagnosis not present

## 2020-01-05 MED ORDER — NITROGLYCERIN 0.4 MG SL SUBL: 0.4000 mg | SUBLINGUAL_TABLET | SUBLINGUAL | 3 refills | Status: AC | PRN

## 2020-01-05 NOTE — Patient Instructions (Signed)
Medication Instructions:  Your physician recommends that you continue on your current medications as directed. Please refer to the Current Medication list given to you today.  *If you need a refill on your cardiac medications before your next appointment, please call your pharmacy*   Lab Work: None If you have labs (blood work) drawn today and your tests are completely normal, you will receive your results only by: [name] MyChart Message (if you have MyChart) OR . A paper copy in the mail If you have any lab test that is abnormal or we need to change your treatment, we will call you to review the results.   Testing/Procedures: Your physician has requested that you have an echocardiogram. Echocardiography is a painless test that uses sound waves to create images of your heart. It provides your doctor with information about the size and shape of your heart and how well your heart's chambers and valves are working. This procedure takes approximately one hour. There are no restrictions for this procedure.   Follow-Up: At [facility], you and your health needs are our priority.  As part of our continuing mission to provide you with exceptional heart care, we have created designated Provider Care Teams.  These Care Teams include your primary Cardiologist (physician) and Advanced Practice Providers (APPs -  Physician Assistants and Nurse Practitioners) who all work together to provide you with the care you need, when you need it.  We recommend signing up for the patient portal called "MyChart".  Sign up information is provided on this After Visit Summary.  MyChart is used to connect with patients for Virtual Visits (Telemedicine).  Patients are able to view lab/test results, encounter notes, upcoming appointments, etc.  Non-urgent messages can be sent to your provider as well.   To learn more about what you can do with MyChart, go to [email or web address].    Your next appointment:   12  month(s)  The format for your next appointment:   In Person  Provider:   You may see Sinclair Grooms, MD or one of the following Advanced Practice Providers on your designated Care Team:    Truitt Merle, NP  Cecilie Kicks, NP  Kathyrn Drown, NP    Other Instructions
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
## 2020-01-26 ENCOUNTER — Telehealth: Payer: Self-pay | Admitting: Hematology and Oncology

## 2020-01-26 NOTE — Telephone Encounter (Signed)
Received a new hem referral from Dr. Felipa Eth for polycythemia. Kenneth Giles has been cld and scheduled to see Dr. Chryl Heck on 12/28 at 1pm. Pt aware to arrive 30 minutes early.

## 2020-01-28 ENCOUNTER — Telehealth (INDEPENDENT_AMBULATORY_CARE_PROVIDER_SITE_OTHER): Payer: Medicare Other | Admitting: Cardiology

## 2020-01-28 ENCOUNTER — Telehealth: Payer: Self-pay | Admitting: *Deleted

## 2020-01-28 ENCOUNTER — Encounter: Payer: Self-pay | Admitting: Cardiology

## 2020-01-28 ENCOUNTER — Other Ambulatory Visit: Payer: Self-pay

## 2020-01-28 VITALS — HR 63 | Ht 66.0 in | Wt 219.0 lb

## 2020-01-28 DIAGNOSIS — I10 Essential (primary) hypertension: Secondary | ICD-10-CM | POA: Diagnosis not present

## 2020-01-28 DIAGNOSIS — G4733 Obstructive sleep apnea (adult) (pediatric): Secondary | ICD-10-CM

## 2020-01-28 DIAGNOSIS — J310 Chronic rhinitis: Secondary | ICD-10-CM | POA: Diagnosis not present

## 2020-01-28 DIAGNOSIS — Z9989 Dependence on other enabling machines and devices: Secondary | ICD-10-CM

## 2020-01-28 DIAGNOSIS — I255 Ischemic cardiomyopathy: Secondary | ICD-10-CM

## 2020-01-28 DIAGNOSIS — E669 Obesity, unspecified: Secondary | ICD-10-CM

## 2020-01-28 MED ORDER — SALINE SPRAY 0.65 % NA SOLN: 2.0000 | Freq: Two times a day (BID) | NASAL | 0 refills | Status: AC

## 2020-01-28 NOTE — Telephone Encounter (Signed)
-----   Message from Antonieta Iba, RN sent at 01/28/2020  9:40 AM EST ----- Please order this patient a new chin strap per Dr. Radford Pax.  Thanks!!

## 2020-01-28 NOTE — Telephone Encounter (Signed)
Chin strap order placed to [facility] via fax.
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
## 2020-01-28 NOTE — Patient Instructions (Signed)
Medication Instructions:  Your physician has recommended you make the following change in your medication:  1) START using nasal saline spray twice daily  *If you need a refill on your cardiac medications before your next appointment, please call your pharmacy*  Follow-Up: At [facility], you and your health needs are our priority.  As part of our continuing mission to provide you with exceptional heart care, we have created designated Provider Care Teams.  These Care Teams include your primary Cardiologist (physician) and Advanced Practice Providers (APPs -  Physician Assistants and Nurse Practitioners) who all work together to provide you with the care you need, when you need it.    Your next appointment:   3 month(s)  The format for your next appointment:   In Person  Provider:   Fransico Him, MD  Other Instructions You have been referred to Dr. Wilburn Cornelia, an [facility] [place] [facility] Thro[facility] Specialist. Someone will be in contact with you to schedule an appointment.

## 2020-01-28 NOTE — Addendum Note (Signed)
Addended by: Antonieta Iba on: 01/28/2020 09:40 AM   Modules accepted: Orders

## 2020-02-02 ENCOUNTER — Other Ambulatory Visit: Payer: Self-pay

## 2020-02-02 ENCOUNTER — Ambulatory Visit (HOSPITAL_COMMUNITY): Payer: Medicare Other | Attending: Cardiovascular Disease

## 2020-02-02 DIAGNOSIS — I5022 Chronic systolic (congestive) heart failure: Secondary | ICD-10-CM

## 2020-02-02 LAB — ECHOCARDIOGRAM COMPLETE
Area-P 1/2: 3.85 cm2
S' Lateral: 3.3 cm

## 2020-02-02 MED ORDER — PERFLUTREN LIPID MICROSPHERE
1.0000 mL | INTRAVENOUS | Status: AC | PRN
  Administered 2020-02-02: 1 mL via INTRAVENOUS

## 2020-02-10 ENCOUNTER — Inpatient Hospital Stay: Payer: Medicare Other | Attending: Hematology and Oncology | Admitting: Hematology and Oncology

## 2020-02-10 ENCOUNTER — Inpatient Hospital Stay: Payer: Medicare Other

## 2020-02-10 ENCOUNTER — Other Ambulatory Visit: Payer: Self-pay

## 2020-02-10 ENCOUNTER — Encounter: Payer: Self-pay | Admitting: Hematology and Oncology

## 2020-02-10 VITALS — BP 142/76 | HR 60 | Temp 98.1°F | Resp 18 | Ht 66.0 in | Wt 211.7 lb

## 2020-02-10 DIAGNOSIS — Z9989 Dependence on other enabling machines and devices: Secondary | ICD-10-CM | POA: Insufficient documentation

## 2020-02-10 DIAGNOSIS — N400 Enlarged prostate without lower urinary tract symptoms: Secondary | ICD-10-CM | POA: Diagnosis not present

## 2020-02-10 DIAGNOSIS — D751 Secondary polycythemia: Secondary | ICD-10-CM

## 2020-02-10 DIAGNOSIS — G4733 Obstructive sleep apnea (adult) (pediatric): Secondary | ICD-10-CM | POA: Diagnosis not present

## 2020-02-10 DIAGNOSIS — K219 Gastro-esophageal reflux disease without esophagitis: Secondary | ICD-10-CM | POA: Insufficient documentation

## 2020-02-10 DIAGNOSIS — Z7984 Long term (current) use of oral hypoglycemic drugs: Secondary | ICD-10-CM | POA: Diagnosis not present

## 2020-02-10 DIAGNOSIS — E119 Type 2 diabetes mellitus without complications: Secondary | ICD-10-CM | POA: Diagnosis not present

## 2020-02-10 DIAGNOSIS — I2510 Atherosclerotic heart disease of native coronary artery without angina pectoris: Secondary | ICD-10-CM | POA: Insufficient documentation

## 2020-02-10 DIAGNOSIS — Z951 Presence of aortocoronary bypass graft: Secondary | ICD-10-CM | POA: Insufficient documentation

## 2020-02-10 DIAGNOSIS — Z95810 Presence of automatic (implantable) cardiac defibrillator: Secondary | ICD-10-CM | POA: Diagnosis not present

## 2020-02-10 DIAGNOSIS — Z7982 Long term (current) use of aspirin: Secondary | ICD-10-CM | POA: Diagnosis not present

## 2020-02-10 DIAGNOSIS — M1990 Unspecified osteoarthritis, unspecified site: Secondary | ICD-10-CM | POA: Diagnosis not present

## 2020-02-10 DIAGNOSIS — Z79899 Other long term (current) drug therapy: Secondary | ICD-10-CM | POA: Insufficient documentation

## 2020-02-10 DIAGNOSIS — I5022 Chronic systolic (congestive) heart failure: Secondary | ICD-10-CM | POA: Diagnosis not present

## 2020-02-10 DIAGNOSIS — I110 Hypertensive heart disease with heart failure: Secondary | ICD-10-CM | POA: Insufficient documentation

## 2020-02-10 DIAGNOSIS — E785 Hyperlipidemia, unspecified: Secondary | ICD-10-CM | POA: Insufficient documentation

## 2020-02-10 LAB — COMPREHENSIVE METABOLIC PANEL
ALT: 35 U/L (ref 0–44)
AST: 29 U/L (ref 15–41)
Albumin: 4 g/dL (ref 3.5–5.0)
Alkaline Phosphatase: 45 U/L (ref 38–126)
Anion gap: 7 (ref 5–15)
BUN: 18 mg/dL (ref 8–23)
CO2: 26 mmol/L (ref 22–32)
Calcium: 9.7 mg/dL (ref 8.9–10.3)
Chloride: 109 mmol/L (ref 98–111)
Creatinine, Ser: 1.52 mg/dL — ABNORMAL HIGH (ref 0.61–1.24)
GFR, Estimated: 50 mL/min — ABNORMAL LOW (ref >60)
Glucose, Bld: 132 mg/dL — ABNORMAL HIGH (ref 70–99)
Potassium: 3.9 mmol/L (ref 3.5–5.1)
Sodium: 142 mmol/L (ref 135–145)
Total Bilirubin: 0.8 mg/dL (ref 0.3–1.2)
Total Protein: 7.3 g/dL (ref 6.5–8.1)

## 2020-02-10 LAB — CBC WITH DIFFERENTIAL/PLATELET
Abs Immature Granulocytes: 0.01 10*3/uL (ref 0.00–0.07)
Basophils Absolute: 0 10*3/uL (ref 0.0–0.1)
Basophils Relative: 1 %
Eosinophils Absolute: 0.2 10*3/uL (ref 0.0–0.5)
Eosinophils Relative: 3 %
HCT: 53.5 % — ABNORMAL HIGH (ref 39.0–52.0)
Hemoglobin: 17.9 g/dL — ABNORMAL HIGH (ref 13.0–17.0)
Immature Granulocytes: 0 %
Lymphocytes Relative: 29 %
Lymphs Abs: 1.5 10*3/uL (ref 0.7–4.0)
MCH: 28.3 pg (ref 26.0–34.0)
MCHC: 33.5 g/dL (ref 30.0–36.0)
MCV: 84.5 fL (ref 80.0–100.0)
Monocytes Absolute: 0.6 10*3/uL (ref 0.1–1.0)
Monocytes Relative: 11 %
Neutro Abs: 2.8 10*3/uL (ref 1.7–7.7)
Neutrophils Relative %: 56 %
Platelets: 100 10*3/uL — ABNORMAL LOW (ref 150–400)
RBC: 6.33 MIL/uL — ABNORMAL HIGH (ref 4.22–5.81)
RDW: 14.1 % (ref 11.5–15.5)
WBC: 5 10*3/uL (ref 4.0–10.5)
nRBC: 0 % (ref 0.0–0.2)
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
## 2020-02-19 ENCOUNTER — Other Ambulatory Visit: Payer: Self-pay | Admitting: Interventional Cardiology

## 2020-02-20 ENCOUNTER — Other Ambulatory Visit: Payer: Self-pay

## 2020-02-20 ENCOUNTER — Inpatient Hospital Stay: Payer: Medicare Other | Attending: Hematology and Oncology

## 2020-02-20 VITALS — BP 146/80 | HR 61 | Temp 97.9°F | Resp 18

## 2020-02-20 DIAGNOSIS — D751 Secondary polycythemia: Secondary | ICD-10-CM | POA: Diagnosis not present

## 2020-02-20 NOTE — Patient Instructions (Addendum)

## 2020-02-20 NOTE — Progress Notes (Signed)
First time phlebotomy to R forearm. 18g used. Pt. Tolerated well. 513g removed. Beverage and snack provided.

## 2020-02-23 ENCOUNTER — Ambulatory Visit (INDEPENDENT_AMBULATORY_CARE_PROVIDER_SITE_OTHER): Payer: Medicare Other

## 2020-02-23 DIAGNOSIS — I255 Ischemic cardiomyopathy: Secondary | ICD-10-CM | POA: Diagnosis not present

## 2020-02-23 LAB — CUP PACEART REMOTE DEVICE CHECK
Battery Remaining Longevity: 55 mo
Battery Remaining Percentage: 60 %
Battery Voltage: 2.96 V
Brady Statistic AP VP Percent: 47 %
Brady Statistic AP VS Percent: 1 %
Brady Statistic AS VP Percent: 53 %
Brady Statistic AS VS Percent: 1 %
Brady Statistic RA Percent Paced: 47 %
Date Time Interrogation Session: 20220110040334
HighPow Impedance: 75 Ohm
HighPow Impedance: 75 Ohm
Implantable Lead Implant Date: 20181115
Implantable Lead Implant Date: 20181115
Implantable Lead Implant Date: 20181115
Implantable Lead Location: 753858
Implantable Lead Location: 753859
Implantable Lead Location: 753860
Implantable Lead Model: 7122
Implantable Pulse Generator Implant Date: 20181115
Lead Channel Impedance Value: 480 Ohm
Lead Channel Impedance Value: 490 Ohm
Lead Channel Impedance Value: 750 Ohm
Lead Channel Pacing Threshold Amplitude: 0.625 V
Lead Channel Pacing Threshold Amplitude: 0.75 V
Lead Channel Pacing Threshold Amplitude: 0.875 V
Lead Channel Pacing Threshold Pulse Width: 0.5 ms
Lead Channel Pacing Threshold Pulse Width: 0.5 ms
Lead Channel Pacing Threshold Pulse Width: 0.5 ms
Lead Channel Sensing Intrinsic Amplitude: 11.7 mV
Lead Channel Sensing Intrinsic Amplitude: 4.1 mV
Lead Channel Setting Pacing Amplitude: 1.625
Lead Channel Setting Pacing Amplitude: 2 V
Lead Channel Setting Pacing Amplitude: 2 V
Lead Channel Setting Pacing Pulse Width: 0.5 ms
Lead Channel Setting Pacing Pulse Width: 0.5 ms
Lead Channel Setting Sensing Sensitivity: 0.5 mV
Pulse Gen Serial Number: 9780956

## 2020-03-03 ENCOUNTER — Other Ambulatory Visit: Payer: Self-pay | Admitting: Interventional Cardiology

## 2020-03-08 NOTE — Progress Notes (Signed)
Remote ICD transmission.   

## 2020-03-24 ENCOUNTER — Telehealth: Payer: Self-pay | Admitting: Cardiology

## 2020-03-24 NOTE — Telephone Encounter (Signed)
FYI--  Myrlene Broker with [facility] is calling to make Dr. Radford Pax aware that the paitent's referral was received. The patient has been scheduled for 04/05/20 at 2:00 PM with Dr. Wilburn Cornelia. If additional questions, please return call to [facility] at [phone].

## 2020-04-05 DIAGNOSIS — G4733 Obstructive sleep apnea (adult) (pediatric): Secondary | ICD-10-CM | POA: Insufficient documentation

## 2020-05-06 ENCOUNTER — Ambulatory Visit: Payer: Medicare Other | Admitting: Cardiology

## 2020-05-24 ENCOUNTER — Ambulatory Visit (INDEPENDENT_AMBULATORY_CARE_PROVIDER_SITE_OTHER): Payer: Medicare Other

## 2020-05-24 DIAGNOSIS — I255 Ischemic cardiomyopathy: Secondary | ICD-10-CM

## 2020-05-26 LAB — CUP PACEART REMOTE DEVICE CHECK
Battery Remaining Longevity: 52 mo
Battery Remaining Percentage: 57 %
Battery Voltage: 2.95 V
Brady Statistic AP VP Percent: 53 %
Brady Statistic AP VS Percent: 1 %
Brady Statistic AS VP Percent: 47 %
Brady Statistic AS VS Percent: 1 %
Brady Statistic RA Percent Paced: 53 %
Date Time Interrogation Session: 20220411020027
HighPow Impedance: 77 Ohm
HighPow Impedance: 77 Ohm
Implantable Lead Implant Date: 20181115
Implantable Lead Implant Date: 20181115
Implantable Lead Implant Date: 20181115
Implantable Lead Location: 753858
Implantable Lead Location: 753859
Implantable Lead Location: 753860
Implantable Lead Model: 7122
Implantable Pulse Generator Implant Date: 20181115
Lead Channel Impedance Value: 460 Ohm
Lead Channel Impedance Value: 530 Ohm
Lead Channel Impedance Value: 750 Ohm
Lead Channel Pacing Threshold Amplitude: 0.5 V
Lead Channel Pacing Threshold Amplitude: 0.75 V
Lead Channel Pacing Threshold Amplitude: 0.875 V
Lead Channel Pacing Threshold Pulse Width: 0.5 ms
Lead Channel Pacing Threshold Pulse Width: 0.5 ms
Lead Channel Pacing Threshold Pulse Width: 0.5 ms
Lead Channel Sensing Intrinsic Amplitude: 11.7 mV
Lead Channel Sensing Intrinsic Amplitude: 4 mV
Lead Channel Setting Pacing Amplitude: 1.5 V
Lead Channel Setting Pacing Amplitude: 2 V
Lead Channel Setting Pacing Amplitude: 2 V
Lead Channel Setting Pacing Pulse Width: 0.5 ms
Lead Channel Setting Pacing Pulse Width: 0.5 ms
Lead Channel Setting Sensing Sensitivity: 0.5 mV
Pulse Gen Serial Number: 9780956

## 2020-06-08 ENCOUNTER — Telehealth: Payer: Self-pay | Admitting: Hematology and Oncology

## 2020-06-08 ENCOUNTER — Other Ambulatory Visit: Payer: Self-pay

## 2020-06-08 ENCOUNTER — Encounter: Payer: Self-pay | Admitting: Hematology and Oncology

## 2020-06-08 ENCOUNTER — Inpatient Hospital Stay: Payer: Medicare Other

## 2020-06-08 ENCOUNTER — Inpatient Hospital Stay: Payer: Medicare Other | Attending: Hematology and Oncology | Admitting: Hematology and Oncology

## 2020-06-08 VITALS — BP 134/58 | HR 64 | Temp 97.8°F | Resp 18 | Wt 210.8 lb

## 2020-06-08 DIAGNOSIS — G4733 Obstructive sleep apnea (adult) (pediatric): Secondary | ICD-10-CM | POA: Diagnosis not present

## 2020-06-08 DIAGNOSIS — D751 Secondary polycythemia: Secondary | ICD-10-CM | POA: Insufficient documentation

## 2020-06-08 DIAGNOSIS — J342 Deviated nasal septum: Secondary | ICD-10-CM | POA: Insufficient documentation

## 2020-06-08 DIAGNOSIS — I110 Hypertensive heart disease with heart failure: Secondary | ICD-10-CM | POA: Diagnosis not present

## 2020-06-08 DIAGNOSIS — D696 Thrombocytopenia, unspecified: Secondary | ICD-10-CM | POA: Diagnosis not present

## 2020-06-08 DIAGNOSIS — Z951 Presence of aortocoronary bypass graft: Secondary | ICD-10-CM | POA: Diagnosis not present

## 2020-06-08 DIAGNOSIS — I2510 Atherosclerotic heart disease of native coronary artery without angina pectoris: Secondary | ICD-10-CM | POA: Diagnosis not present

## 2020-06-08 DIAGNOSIS — E7800 Pure hypercholesterolemia, unspecified: Secondary | ICD-10-CM | POA: Diagnosis not present

## 2020-06-08 DIAGNOSIS — Z79899 Other long term (current) drug therapy: Secondary | ICD-10-CM | POA: Insufficient documentation

## 2020-06-08 DIAGNOSIS — Z7982 Long term (current) use of aspirin: Secondary | ICD-10-CM | POA: Diagnosis not present

## 2020-06-08 DIAGNOSIS — Z7984 Long term (current) use of oral hypoglycemic drugs: Secondary | ICD-10-CM | POA: Insufficient documentation

## 2020-06-08 DIAGNOSIS — I5022 Chronic systolic (congestive) heart failure: Secondary | ICD-10-CM | POA: Diagnosis not present

## 2020-06-08 LAB — CMP (CANCER CENTER ONLY)
ALT: 27 U/L (ref 0–44)
AST: 25 U/L (ref 15–41)
Albumin: 4 g/dL (ref 3.5–5.0)
Alkaline Phosphatase: 44 U/L (ref 38–126)
Anion gap: 10 (ref 5–15)
BUN: 19 mg/dL (ref 8–23)
CO2: 25 mmol/L (ref 22–32)
Calcium: 9.3 mg/dL (ref 8.9–10.3)
Chloride: 107 mmol/L (ref 98–111)
Creatinine: 1.41 mg/dL — ABNORMAL HIGH (ref 0.61–1.24)
GFR, Estimated: 55 mL/min — ABNORMAL LOW (ref >60)
Glucose, Bld: 138 mg/dL — ABNORMAL HIGH (ref 70–99)
Potassium: 4 mmol/L (ref 3.5–5.1)
Sodium: 142 mmol/L (ref 135–145)
Total Bilirubin: 0.8 mg/dL (ref 0.3–1.2)
Total Protein: 7.1 g/dL (ref 6.5–8.1)

## 2020-06-08 LAB — CBC WITH DIFFERENTIAL/PLATELET
Abs Immature Granulocytes: 0.01 10*3/uL (ref 0.00–0.07)
Basophils Absolute: 0 10*3/uL (ref 0.0–0.1)
Basophils Relative: 1 %
Eosinophils Absolute: 0.1 10*3/uL (ref 0.0–0.5)
Eosinophils Relative: 2 %
HCT: 52 % (ref 39.0–52.0)
Hemoglobin: 17.1 g/dL — ABNORMAL HIGH (ref 13.0–17.0)
Immature Granulocytes: 0 %
Lymphocytes Relative: 27 %
Lymphs Abs: 1.4 10*3/uL (ref 0.7–4.0)
MCH: 27.6 pg (ref 26.0–34.0)
MCHC: 32.9 g/dL (ref 30.0–36.0)
MCV: 84 fL (ref 80.0–100.0)
Monocytes Absolute: 0.6 10*3/uL (ref 0.1–1.0)
Monocytes Relative: 11 %
Neutro Abs: 3.1 10*3/uL (ref 1.7–7.7)
Neutrophils Relative %: 59 %
Platelets: 98 10*3/uL — ABNORMAL LOW (ref 150–400)
RBC: 6.19 MIL/uL — ABNORMAL HIGH (ref 4.22–5.81)
RDW: 14.5 % (ref 11.5–15.5)
WBC: 5.3 10*3/uL (ref 4.0–10.5)
nRBC: 0 % (ref 0.0–0.2)

## 2020-06-08 LAB — TYPE AND SCREEN
ABO/RH(D): A POS
Antibody Screen: NEGATIVE

## 2020-06-08 NOTE — Telephone Encounter (Signed)
Scheduled follow-up appointments per 4/26 los. Patient is aware. 
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
## 2020-06-08 NOTE — Progress Notes (Signed)
Remote ICD transmission.   

## 2020-06-09 ENCOUNTER — Telehealth: Payer: Self-pay | Admitting: *Deleted

## 2020-06-09 NOTE — Telephone Encounter (Signed)
-----   Message from Benay Pike, MD sent at 06/08/2020 11:15 PM EDT ----- He wanted to know his blood type,A positive. Please let him know. No need for immediate phlebotomy based on labs today. He can RTC for follow up as scheduled or let us know if he has any new symptoms.

## 2020-06-09 NOTE — Telephone Encounter (Signed)
LM with note below 

## 2020-06-30 ENCOUNTER — Ambulatory Visit: Payer: 59 | Admitting: Cardiology

## 2020-08-19 ENCOUNTER — Ambulatory Visit: Payer: 59 | Admitting: Cardiology

## 2020-08-23 ENCOUNTER — Ambulatory Visit (INDEPENDENT_AMBULATORY_CARE_PROVIDER_SITE_OTHER): Payer: Medicare Other

## 2020-08-23 DIAGNOSIS — I255 Ischemic cardiomyopathy: Secondary | ICD-10-CM

## 2020-08-23 LAB — CUP PACEART REMOTE DEVICE CHECK
Battery Remaining Longevity: 50 mo
Battery Remaining Percentage: 55 %
Battery Voltage: 2.95 V
Brady Statistic AP VP Percent: 50 %
Brady Statistic AP VS Percent: 1 %
Brady Statistic AS VP Percent: 50 %
Brady Statistic AS VS Percent: 1 %
Brady Statistic RA Percent Paced: 50 %
Date Time Interrogation Session: 20220711020025
HighPow Impedance: 79 Ohm
HighPow Impedance: 79 Ohm
Implantable Lead Implant Date: 20181115
Implantable Lead Implant Date: 20181115
Implantable Lead Implant Date: 20181115
Implantable Lead Location: 753858
Implantable Lead Location: 753859
Implantable Lead Location: 753860
Implantable Lead Model: 7122
Implantable Pulse Generator Implant Date: 20181115
Lead Channel Impedance Value: 460 Ohm
Lead Channel Impedance Value: 530 Ohm
Lead Channel Impedance Value: 740 Ohm
Lead Channel Pacing Threshold Amplitude: 0.5 V
Lead Channel Pacing Threshold Amplitude: 0.75 V
Lead Channel Pacing Threshold Amplitude: 0.75 V
Lead Channel Pacing Threshold Pulse Width: 0.5 ms
Lead Channel Pacing Threshold Pulse Width: 0.5 ms
Lead Channel Pacing Threshold Pulse Width: 0.5 ms
Lead Channel Sensing Intrinsic Amplitude: 11.7 mV
Lead Channel Sensing Intrinsic Amplitude: 4.1 mV
Lead Channel Setting Pacing Amplitude: 1.5 V
Lead Channel Setting Pacing Amplitude: 2 V
Lead Channel Setting Pacing Amplitude: 2 V
Lead Channel Setting Pacing Pulse Width: 0.5 ms
Lead Channel Setting Pacing Pulse Width: 0.5 ms
Lead Channel Setting Sensing Sensitivity: 0.5 mV
Pulse Gen Serial Number: 9780956

## 2020-09-16 NOTE — Progress Notes (Signed)
Remote ICD transmission.   

## 2020-10-07 ENCOUNTER — Inpatient Hospital Stay: Payer: Medicare Other

## 2020-10-07 ENCOUNTER — Encounter: Payer: Self-pay | Admitting: Hematology and Oncology

## 2020-10-07 ENCOUNTER — Other Ambulatory Visit: Payer: Self-pay

## 2020-10-07 ENCOUNTER — Inpatient Hospital Stay: Payer: Medicare Other | Attending: Hematology and Oncology | Admitting: Hematology and Oncology

## 2020-10-07 VITALS — BP 129/55 | HR 63 | Temp 98.2°F | Resp 18 | Ht 66.0 in | Wt 205.9 lb

## 2020-10-07 DIAGNOSIS — D751 Secondary polycythemia: Secondary | ICD-10-CM | POA: Diagnosis present

## 2020-10-07 DIAGNOSIS — E7800 Pure hypercholesterolemia, unspecified: Secondary | ICD-10-CM | POA: Insufficient documentation

## 2020-10-07 DIAGNOSIS — Z79899 Other long term (current) drug therapy: Secondary | ICD-10-CM | POA: Diagnosis not present

## 2020-10-07 DIAGNOSIS — Z7984 Long term (current) use of oral hypoglycemic drugs: Secondary | ICD-10-CM | POA: Insufficient documentation

## 2020-10-07 DIAGNOSIS — E119 Type 2 diabetes mellitus without complications: Secondary | ICD-10-CM | POA: Diagnosis not present

## 2020-10-07 DIAGNOSIS — I110 Hypertensive heart disease with heart failure: Secondary | ICD-10-CM | POA: Diagnosis not present

## 2020-10-07 DIAGNOSIS — D696 Thrombocytopenia, unspecified: Secondary | ICD-10-CM | POA: Diagnosis not present

## 2020-10-07 DIAGNOSIS — N400 Enlarged prostate without lower urinary tract symptoms: Secondary | ICD-10-CM | POA: Diagnosis not present

## 2020-10-07 DIAGNOSIS — I5022 Chronic systolic (congestive) heart failure: Secondary | ICD-10-CM | POA: Insufficient documentation

## 2020-10-07 DIAGNOSIS — Z7982 Long term (current) use of aspirin: Secondary | ICD-10-CM | POA: Insufficient documentation

## 2020-10-07 DIAGNOSIS — G4733 Obstructive sleep apnea (adult) (pediatric): Secondary | ICD-10-CM | POA: Insufficient documentation

## 2020-10-07 LAB — CBC WITH DIFFERENTIAL/PLATELET
Abs Immature Granulocytes: 0.01 10*3/uL (ref 0.00–0.07)
Basophils Absolute: 0.1 10*3/uL (ref 0.0–0.1)
Basophils Relative: 1 %
Eosinophils Absolute: 0.2 10*3/uL (ref 0.0–0.5)
Eosinophils Relative: 4 %
HCT: 52.7 % — ABNORMAL HIGH (ref 39.0–52.0)
Hemoglobin: 17.7 g/dL — ABNORMAL HIGH (ref 13.0–17.0)
Immature Granulocytes: 0 %
Lymphocytes Relative: 28 %
Lymphs Abs: 1.2 10*3/uL (ref 0.7–4.0)
MCH: 28.2 pg (ref 26.0–34.0)
MCHC: 33.6 g/dL (ref 30.0–36.0)
MCV: 84.1 fL (ref 80.0–100.0)
Monocytes Absolute: 0.5 10*3/uL (ref 0.1–1.0)
Monocytes Relative: 11 %
Neutro Abs: 2.4 10*3/uL (ref 1.7–7.7)
Neutrophils Relative %: 56 %
Platelets: 91 10*3/uL — ABNORMAL LOW (ref 150–400)
RBC: 6.27 MIL/uL — ABNORMAL HIGH (ref 4.22–5.81)
RDW: 14.7 % (ref 11.5–15.5)
WBC: 4.4 10*3/uL (ref 4.0–10.5)
nRBC: 0 % (ref 0.0–0.2)

## 2020-10-07 LAB — COMPREHENSIVE METABOLIC PANEL
ALT: 33 U/L (ref 0–44)
AST: 31 U/L (ref 15–41)
Albumin: 3.9 g/dL (ref 3.5–5.0)
Alkaline Phosphatase: 48 U/L (ref 38–126)
Anion gap: 8 (ref 5–15)
BUN: 18 mg/dL (ref 8–23)
CO2: 23 mmol/L (ref 22–32)
Calcium: 9.3 mg/dL (ref 8.9–10.3)
Chloride: 109 mmol/L (ref 98–111)
Creatinine, Ser: 1.44 mg/dL — ABNORMAL HIGH (ref 0.61–1.24)
GFR, Estimated: 53 mL/min — ABNORMAL LOW (ref >60)
Glucose, Bld: 193 mg/dL — ABNORMAL HIGH (ref 70–99)
Potassium: 4 mmol/L (ref 3.5–5.1)
Sodium: 140 mmol/L (ref 135–145)
Total Bilirubin: 0.7 mg/dL (ref 0.3–1.2)
Total Protein: 7.1 g/dL (ref 6.5–8.1)
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
## 2020-11-22 ENCOUNTER — Ambulatory Visit (INDEPENDENT_AMBULATORY_CARE_PROVIDER_SITE_OTHER): Payer: Medicare Other

## 2020-11-22 ENCOUNTER — Telehealth: Payer: Self-pay | Admitting: *Deleted

## 2020-11-22 DIAGNOSIS — I255 Ischemic cardiomyopathy: Secondary | ICD-10-CM

## 2020-11-22 NOTE — Telephone Encounter (Addendum)
Cancelled patients appointment because patient states he has not used his cpap in a while because of sinus problems do you want him to reschedule?  Per Dr Radford Pax: yes - refer to ENT and then followup with me in 2 months  He has been to the ENT and was told his left septum is heavily deviated and was recommended to have it straightened but he has not made that appointment yet. Patient says he will call our office and make an appointment once he is able to get back on his sleep therapy.

## 2020-11-24 ENCOUNTER — Ambulatory Visit: Payer: 59 | Admitting: Cardiology

## 2020-11-24 LAB — CUP PACEART REMOTE DEVICE CHECK
Battery Remaining Longevity: 48 mo
Battery Remaining Percentage: 52 %
Battery Voltage: 2.95 V
Brady Statistic AP VP Percent: 49 %
Brady Statistic AP VS Percent: 1 %
Brady Statistic AS VP Percent: 51 %
Brady Statistic AS VS Percent: 1 %
Brady Statistic RA Percent Paced: 49 %
Date Time Interrogation Session: 20221010020016
HighPow Impedance: 77 Ohm
HighPow Impedance: 77 Ohm
Implantable Lead Implant Date: 20181115
Implantable Lead Implant Date: 20181115
Implantable Lead Implant Date: 20181115
Implantable Lead Location: 753858
Implantable Lead Location: 753859
Implantable Lead Location: 753860
Implantable Lead Model: 7122
Implantable Pulse Generator Implant Date: 20181115
Lead Channel Impedance Value: 460 Ohm
Lead Channel Impedance Value: 490 Ohm
Lead Channel Impedance Value: 710 Ohm
Lead Channel Pacing Threshold Amplitude: 0.5 V
Lead Channel Pacing Threshold Amplitude: 0.75 V
Lead Channel Pacing Threshold Amplitude: 0.875 V
Lead Channel Pacing Threshold Pulse Width: 0.5 ms
Lead Channel Pacing Threshold Pulse Width: 0.5 ms
Lead Channel Pacing Threshold Pulse Width: 0.5 ms
Lead Channel Sensing Intrinsic Amplitude: 11.7 mV
Lead Channel Sensing Intrinsic Amplitude: 2.7 mV
Lead Channel Setting Pacing Amplitude: 1.5 V
Lead Channel Setting Pacing Amplitude: 2 V
Lead Channel Setting Pacing Amplitude: 2 V
Lead Channel Setting Pacing Pulse Width: 0.5 ms
Lead Channel Setting Pacing Pulse Width: 0.5 ms
Lead Channel Setting Sensing Sensitivity: 0.5 mV
Pulse Gen Serial Number: 9780956

## 2020-12-01 NOTE — Progress Notes (Signed)
Remote ICD transmission.   

## 2020-12-07 ENCOUNTER — Other Ambulatory Visit (HOSPITAL_COMMUNITY): Payer: Self-pay | Admitting: Urology

## 2020-12-07 DIAGNOSIS — C61 Malignant neoplasm of prostate: Secondary | ICD-10-CM

## 2020-12-13 ENCOUNTER — Ambulatory Visit (HOSPITAL_COMMUNITY)
Admission: RE | Admit: 2020-12-13 | Discharge: 2020-12-13 | Disposition: A | Discharge status: Home / Self Care | Payer: Medicare Other | Source: Ambulatory Visit | Attending: Urology | Admitting: Urology

## 2020-12-13 ENCOUNTER — Other Ambulatory Visit: Payer: Self-pay

## 2020-12-13 DIAGNOSIS — C61 Malignant neoplasm of prostate: Secondary | ICD-10-CM | POA: Insufficient documentation

## 2020-12-13 MED ORDER — PIFLIFOLASTAT F 18 (PYLARIFY) INJECTION
9.0000 | Freq: Once | INTRAVENOUS | Status: AC
  Administered 2020-12-13: 9.25 via INTRAVENOUS

## 2021-01-03 ENCOUNTER — Other Ambulatory Visit: Payer: Self-pay

## 2021-01-03 ENCOUNTER — Encounter: Payer: Self-pay | Admitting: Internal Medicine

## 2021-01-03 ENCOUNTER — Telehealth: Payer: Self-pay | Admitting: *Deleted

## 2021-01-03 ENCOUNTER — Ambulatory Visit (INDEPENDENT_AMBULATORY_CARE_PROVIDER_SITE_OTHER): Payer: Medicare Other | Admitting: Internal Medicine

## 2021-01-03 VITALS — BP 132/84 | HR 60 | Ht 66.0 in | Wt 206.8 lb

## 2021-01-03 DIAGNOSIS — I2510 Atherosclerotic heart disease of native coronary artery without angina pectoris: Secondary | ICD-10-CM

## 2021-01-03 DIAGNOSIS — I255 Ischemic cardiomyopathy: Secondary | ICD-10-CM | POA: Diagnosis not present

## 2021-01-03 DIAGNOSIS — Z95810 Presence of automatic (implantable) cardiac defibrillator: Secondary | ICD-10-CM

## 2021-01-03 NOTE — Telephone Encounter (Signed)
error 
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
## 2021-01-03 NOTE — Patient Instructions (Signed)
Medication Instructions:  Your physician recommends that you continue on your current medications as directed. Please refer to the Current Medication list given to you today.  Labwork: None ordered.  Testing/Procedures: None ordered.  Follow-Up: Your physician wants you to follow-up in: one year with Cristopher Peru, MD or one of the following Advanced Practice Providers on your designated Care Team:   Tommye Standard, [place] Legrand Como "Jonni Sanger" Chalmers Cater, [place]  Remote monitoring is used to monitor your ICD from home. This monitoring reduces the number of office visits required to check your device to one time per year. It allows [place] to keep an eye on the functioning of your device to ensure it is working properly. You are scheduled for a device check from home on 02/21/2021. You may send your transmission at any time that day. If you have a wireless device, the transmission will be sent automatically. After your physician reviews your transmission, you will receive a postcard with your next transmission date.  Any Other Special Instructions Will Be Listed Below (If Applicable).  If you need a refill on your cardiac medications before your next appointment, please call your pharmacy.

## 2021-01-11 ENCOUNTER — Telehealth: Payer: Self-pay | Admitting: Radiation Oncology

## 2021-01-11 NOTE — Telephone Encounter (Signed)
LVM to schedule consult with Dr. Manning.  ?

## 2021-01-13 NOTE — Progress Notes (Signed)
GU Location of Tumor / Histology: Prostate Ca  If Prostate Cancer, Gleason Score is (5 + 4 ) and PSA is (7.84 as of 5/22)  Biopsies  Dr. Jeffie Pollock        Past/Anticipated interventions by urology, if any:        Weight changes, if any:  No not at this time.  IPSS:  5 SHIM:  1  Bowel/Bladder complaints, if any:  No bowel or bladder frequency depends of fluid intake.  Nausea/Vomiting, if any: No   Pain issues, if any:  0/10  SAFETY ISSUES: Prior radiation?  No Pacemaker/ICD?  Bi-ventricular ICD, it's being managed by Dr. Cristopher Peru. Possible current pregnancy?  Male Is the patient on methotrexate? No  Current Complaints / other details:  Need more information on treatment.

## 2021-01-18 ENCOUNTER — Ambulatory Visit
Admission: RE | Admit: 2021-01-18 | Discharge: 2021-01-18 | Disposition: A | Discharge status: Home / Self Care | Payer: Medicare Other | Source: Ambulatory Visit | Attending: Radiation Oncology | Admitting: Radiation Oncology

## 2021-01-18 ENCOUNTER — Other Ambulatory Visit: Payer: Self-pay

## 2021-01-18 ENCOUNTER — Telehealth: Payer: Self-pay | Admitting: *Deleted

## 2021-01-18 ENCOUNTER — Encounter: Payer: Self-pay | Admitting: Radiation Oncology

## 2021-01-18 VITALS — BP 142/70 | HR 63 | Temp 97.7°F | Resp 20 | Ht 66.0 in | Wt 209.2 lb

## 2021-01-18 DIAGNOSIS — E119 Type 2 diabetes mellitus without complications: Secondary | ICD-10-CM | POA: Insufficient documentation

## 2021-01-18 DIAGNOSIS — I700 Atherosclerosis of aorta: Secondary | ICD-10-CM | POA: Diagnosis not present

## 2021-01-18 DIAGNOSIS — G629 Polyneuropathy, unspecified: Secondary | ICD-10-CM | POA: Insufficient documentation

## 2021-01-18 DIAGNOSIS — I110 Hypertensive heart disease with heart failure: Secondary | ICD-10-CM | POA: Diagnosis not present

## 2021-01-18 DIAGNOSIS — N400 Enlarged prostate without lower urinary tract symptoms: Secondary | ICD-10-CM | POA: Diagnosis not present

## 2021-01-18 DIAGNOSIS — Z87442 Personal history of urinary calculi: Secondary | ICD-10-CM | POA: Insufficient documentation

## 2021-01-18 DIAGNOSIS — K219 Gastro-esophageal reflux disease without esophagitis: Secondary | ICD-10-CM | POA: Insufficient documentation

## 2021-01-18 DIAGNOSIS — C61 Malignant neoplasm of prostate: Secondary | ICD-10-CM | POA: Insufficient documentation

## 2021-01-18 DIAGNOSIS — G4700 Insomnia, unspecified: Secondary | ICD-10-CM | POA: Diagnosis not present

## 2021-01-18 DIAGNOSIS — G4730 Sleep apnea, unspecified: Secondary | ICD-10-CM | POA: Diagnosis not present

## 2021-01-18 DIAGNOSIS — I255 Ischemic cardiomyopathy: Secondary | ICD-10-CM | POA: Diagnosis not present

## 2021-01-18 DIAGNOSIS — Z7982 Long term (current) use of aspirin: Secondary | ICD-10-CM | POA: Diagnosis not present

## 2021-01-18 DIAGNOSIS — I5022 Chronic systolic (congestive) heart failure: Secondary | ICD-10-CM | POA: Diagnosis not present

## 2021-01-18 DIAGNOSIS — E782 Mixed hyperlipidemia: Secondary | ICD-10-CM | POA: Insufficient documentation

## 2021-01-18 DIAGNOSIS — Z6837 Body mass index (BMI) 37.0-37.9, adult: Secondary | ICD-10-CM | POA: Insufficient documentation

## 2021-01-18 DIAGNOSIS — Z79899 Other long term (current) drug therapy: Secondary | ICD-10-CM | POA: Insufficient documentation

## 2021-01-18 DIAGNOSIS — E7800 Pure hypercholesterolemia, unspecified: Secondary | ICD-10-CM | POA: Insufficient documentation

## 2021-01-18 DIAGNOSIS — I2510 Atherosclerotic heart disease of native coronary artery without angina pectoris: Secondary | ICD-10-CM | POA: Insufficient documentation

## 2021-01-18 DIAGNOSIS — Z86010 Personal history of colonic polyps: Secondary | ICD-10-CM | POA: Insufficient documentation
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
## 2021-01-18 NOTE — Telephone Encounter (Signed)
CALLED PATIENT TO INFORM OF ADT APPT. ON 01-24-21- ARRIVAL TIME- 1:45 PM @ [facility], SPOKE WITH PATIENT AND HE IS AWARE OF THIS APPT.

## 2021-01-18 NOTE — Progress Notes (Signed)
Introduced myself to patient as the prostate nurse navigator and discussed my role.  No barriers to care identified at this time. He recently saw Dr. Alinda Money to discuss surgical intervention.   He is here to discuss his radiation treatment options.  I gave him my business card and asked him to call me with questions or concerns.  Verbalized understanding.

## 2021-02-01 ENCOUNTER — Other Ambulatory Visit: Payer: Self-pay | Admitting: Interventional Cardiology
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
## 2021-02-03 ENCOUNTER — Encounter: Payer: Self-pay | Admitting: Hematology and Oncology

## 2021-02-03 ENCOUNTER — Other Ambulatory Visit: Payer: Self-pay

## 2021-02-03 ENCOUNTER — Inpatient Hospital Stay: Payer: Medicare Other

## 2021-02-03 ENCOUNTER — Inpatient Hospital Stay: Payer: Medicare Other | Attending: Hematology and Oncology | Admitting: Hematology and Oncology

## 2021-02-03 VITALS — BP 152/59 | HR 63 | Temp 97.8°F | Resp 18 | Wt 204.4 lb

## 2021-02-03 DIAGNOSIS — I5022 Chronic systolic (congestive) heart failure: Secondary | ICD-10-CM | POA: Diagnosis not present

## 2021-02-03 DIAGNOSIS — C61 Malignant neoplasm of prostate: Secondary | ICD-10-CM | POA: Diagnosis present

## 2021-02-03 DIAGNOSIS — I110 Hypertensive heart disease with heart failure: Secondary | ICD-10-CM | POA: Diagnosis not present

## 2021-02-03 DIAGNOSIS — D751 Secondary polycythemia: Secondary | ICD-10-CM

## 2021-02-03 DIAGNOSIS — D696 Thrombocytopenia, unspecified: Secondary | ICD-10-CM

## 2021-02-03 LAB — COMPREHENSIVE METABOLIC PANEL
ALT: 26 U/L (ref 0–44)
AST: 24 U/L (ref 15–41)
Albumin: 4.2 g/dL (ref 3.5–5.0)
Alkaline Phosphatase: 51 U/L (ref 38–126)
Anion gap: 8 (ref 5–15)
BUN: 22 mg/dL (ref 8–23)
CO2: 26 mmol/L (ref 22–32)
Calcium: 9.8 mg/dL (ref 8.9–10.3)
Chloride: 107 mmol/L (ref 98–111)
Creatinine, Ser: 1.39 mg/dL — ABNORMAL HIGH (ref 0.61–1.24)
GFR, Estimated: 55 mL/min — ABNORMAL LOW (ref >60)
Glucose, Bld: 177 mg/dL — ABNORMAL HIGH (ref 70–99)
Potassium: 3.9 mmol/L (ref 3.5–5.1)
Sodium: 141 mmol/L (ref 135–145)
Total Bilirubin: 0.6 mg/dL (ref 0.3–1.2)
Total Protein: 7.2 g/dL (ref 6.5–8.1)

## 2021-02-03 LAB — CBC WITH DIFFERENTIAL/PLATELET
Abs Immature Granulocytes: 0.01 10*3/uL (ref 0.00–0.07)
Basophils Absolute: 0 10*3/uL (ref 0.0–0.1)
Basophils Relative: 0 %
Eosinophils Absolute: 0.1 10*3/uL (ref 0.0–0.5)
Eosinophils Relative: 3 %
HCT: 52.8 % — ABNORMAL HIGH (ref 39.0–52.0)
Hemoglobin: 17.7 g/dL — ABNORMAL HIGH (ref 13.0–17.0)
Immature Granulocytes: 0 %
Lymphocytes Relative: 26 %
Lymphs Abs: 1.3 10*3/uL (ref 0.7–4.0)
MCH: 27.7 pg (ref 26.0–34.0)
MCHC: 33.5 g/dL (ref 30.0–36.0)
MCV: 82.6 fL (ref 80.0–100.0)
Monocytes Absolute: 0.5 10*3/uL (ref 0.1–1.0)
Monocytes Relative: 10 %
Neutro Abs: 3 10*3/uL (ref 1.7–7.7)
Neutrophils Relative %: 61 %
Platelets: 109 10*3/uL — ABNORMAL LOW (ref 150–400)
RBC: 6.39 MIL/uL — ABNORMAL HIGH (ref 4.22–5.81)
RDW: 13.6 % (ref 11.5–15.5)
WBC: 4.9 10*3/uL (ref 4.0–10.5)
nRBC: 0 % (ref 0.0–0.2)

## 2021-02-03 NOTE — Addendum Note (Signed)
Addended by: Adaline Sill on: 02/03/2021 01:58 PM   Modules accepted: Orders

## 2021-02-21 ENCOUNTER — Ambulatory Visit (INDEPENDENT_AMBULATORY_CARE_PROVIDER_SITE_OTHER): Payer: Medicare Other

## 2021-02-21 DIAGNOSIS — I255 Ischemic cardiomyopathy: Secondary | ICD-10-CM | POA: Diagnosis not present

## 2021-02-21 LAB — CUP PACEART REMOTE DEVICE CHECK
Battery Remaining Longevity: 44 mo
Battery Remaining Percentage: 49 %
Battery Voltage: 2.93 V
Brady Statistic AP VP Percent: 56 %
Brady Statistic AP VS Percent: 1 %
Brady Statistic AS VP Percent: 43 %
Brady Statistic AS VS Percent: 1 %
Brady Statistic RA Percent Paced: 57 %
Date Time Interrogation Session: 20230109020018
HighPow Impedance: 78 Ohm
HighPow Impedance: 78 Ohm
Implantable Lead Implant Date: 20181115
Implantable Lead Implant Date: 20181115
Implantable Lead Implant Date: 20181115
Implantable Lead Location: 753858
Implantable Lead Location: 753859
Implantable Lead Location: 753860
Implantable Lead Model: 7122
Implantable Pulse Generator Implant Date: 20181115
Lead Channel Impedance Value: 460 Ohm
Lead Channel Impedance Value: 530 Ohm
Lead Channel Impedance Value: 710 Ohm
Lead Channel Pacing Threshold Amplitude: 0.5 V
Lead Channel Pacing Threshold Amplitude: 0.75 V
Lead Channel Pacing Threshold Amplitude: 0.75 V
Lead Channel Pacing Threshold Pulse Width: 0.5 ms
Lead Channel Pacing Threshold Pulse Width: 0.5 ms
Lead Channel Pacing Threshold Pulse Width: 0.5 ms
Lead Channel Sensing Intrinsic Amplitude: 11.7 mV
Lead Channel Sensing Intrinsic Amplitude: 4.4 mV
Lead Channel Setting Pacing Amplitude: 1.5 V
Lead Channel Setting Pacing Amplitude: 2 V
Lead Channel Setting Pacing Amplitude: 2 V
Lead Channel Setting Pacing Pulse Width: 0.5 ms
Lead Channel Setting Pacing Pulse Width: 0.5 ms
Lead Channel Setting Sensing Sensitivity: 0.5 mV
Pulse Gen Serial Number: 9780956

## 2021-03-01 NOTE — Progress Notes (Signed)
Remote ICD transmission.   

## 2021-03-17 ENCOUNTER — Ambulatory Visit: Payer: Medicare Other | Admitting: Interventional Cardiology

## 2021-03-21 ENCOUNTER — Other Ambulatory Visit: Payer: Self-pay | Admitting: Urology

## 2021-03-21 ENCOUNTER — Telehealth: Payer: Self-pay | Admitting: *Deleted

## 2021-03-21 NOTE — Telephone Encounter (Signed)
CALLED PATIENT TO INFORM OF FID. MARKER AND SPACE OAR PLACEMENT ON 04-19-21 AND HIS SIM ON 04-25-21- ARRIVAL TIME- 10:45 AM @ [facility], LVM FOR A RETURN CALL

## 2021-03-30 NOTE — Progress Notes (Signed)
Reviewed pt history with dr c Jenkins [place], pt ok for 04-19-2021 wurgery at [facility] per dr c Glennon Mac mda
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
## 2021-04-08 ENCOUNTER — Ambulatory Visit (INDEPENDENT_AMBULATORY_CARE_PROVIDER_SITE_OTHER): Payer: Medicare Other | Admitting: Interventional Cardiology

## 2021-04-08 ENCOUNTER — Other Ambulatory Visit: Payer: Self-pay

## 2021-04-08 ENCOUNTER — Encounter: Payer: Self-pay | Admitting: Hematology and Oncology

## 2021-04-08 ENCOUNTER — Encounter: Payer: Self-pay | Admitting: Interventional Cardiology

## 2021-04-08 VITALS — BP 110/62 | HR 64 | Ht 66.0 in | Wt 204.4 lb

## 2021-04-08 DIAGNOSIS — Z95810 Presence of automatic (implantable) cardiac defibrillator: Secondary | ICD-10-CM

## 2021-04-08 DIAGNOSIS — E7849 Other hyperlipidemia: Secondary | ICD-10-CM

## 2021-04-08 DIAGNOSIS — G4733 Obstructive sleep apnea (adult) (pediatric): Secondary | ICD-10-CM | POA: Diagnosis not present

## 2021-04-08 DIAGNOSIS — Z9989 Dependence on other enabling machines and devices: Secondary | ICD-10-CM

## 2021-04-08 DIAGNOSIS — I2510 Atherosclerotic heart disease of native coronary artery without angina pectoris: Secondary | ICD-10-CM | POA: Diagnosis not present

## 2021-04-08 DIAGNOSIS — I5022 Chronic systolic (congestive) heart failure: Secondary | ICD-10-CM | POA: Diagnosis not present

## 2021-04-08 DIAGNOSIS — I359 Nonrheumatic aortic valve disorder, unspecified: Secondary | ICD-10-CM

## 2021-04-08 DIAGNOSIS — I255 Ischemic cardiomyopathy: Secondary | ICD-10-CM | POA: Diagnosis not present

## 2021-04-08 DIAGNOSIS — I10 Essential (primary) hypertension: Secondary | ICD-10-CM

## 2021-04-08 NOTE — Patient Instructions (Signed)

## 2021-04-12 ENCOUNTER — Other Ambulatory Visit: Payer: Self-pay

## 2021-04-12 ENCOUNTER — Ambulatory Visit (INDEPENDENT_AMBULATORY_CARE_PROVIDER_SITE_OTHER): Payer: Medicare Other | Admitting: Podiatry

## 2021-04-12 DIAGNOSIS — B351 Tinea unguium: Secondary | ICD-10-CM | POA: Diagnosis not present

## 2021-04-12 DIAGNOSIS — M79675 Pain in left toe(s): Secondary | ICD-10-CM

## 2021-04-12 DIAGNOSIS — L600 Ingrowing nail: Secondary | ICD-10-CM

## 2021-04-12 DIAGNOSIS — M79674 Pain in right toe(s): Secondary | ICD-10-CM

## 2021-04-12 DIAGNOSIS — L089 Local infection of the skin and subcutaneous tissue, unspecified: Secondary | ICD-10-CM

## 2021-04-12 HISTORY — DX: Local infection of the skin and subcutaneous tissue, unspecified: L08.9

## 2021-04-12 MED ORDER — CEPHALEXIN 500 MG PO CAPS: 500.0000 mg | ORAL_CAPSULE | Freq: Three times a day (TID) | ORAL | 0 refills | Status: AC

## 2021-04-12 NOTE — Patient Instructions (Signed)

## 2021-04-12 NOTE — Progress Notes (Signed)
°  Subjective:  Patient ID: Kenneth Giles, male    DOB: 1950/09/06,  MRN: [id number]  Chief Complaint  Patient presents with   Toe Pain    Painful left great toe Referring Provider: Lajean Manes     71 y.o. male presents with the above complaint. History confirmed with patient.  He has pain in both great toenails.  There are thickened elongated brown and he is difficulty cutting them.  They curved and impinge on the skin.  Objective:  Physical Exam: warm, good capillary refill, no trophic changes or ulcerative lesions, normal DP and PT pulses, normal sensory exam, and there is onychomycosis and incurvation of the bilateral hallux nail plate.  This is painful for him Assessment:   1. Ingrowing left great toenail   2. Ingrowing right great toenail   3. Pain due to onychomycosis of toenails of both feet      Plan:  Patient was evaluated and treated and all questions answered.  Discussed etiology and treatment options of the condition in detail.  I recommended slant back debridement today which I performed that required local anesthesia of the left hallux.  Remainder of nails were debrided in length and thickness today.  I discussed with him if the ingrown nails continue to recur then a partial permanent nail avulsion matricectomy may be beneficial.  He will return to see me as needed for this.  I did prescribe him cephalexin as a precaution.  He will return for regular routine foot care in the future with Dr. Prudence Davidson.  Return if symptoms worsen or fail to improve.

## 2021-04-14 ENCOUNTER — Other Ambulatory Visit: Payer: Self-pay

## 2021-04-14 ENCOUNTER — Encounter (HOSPITAL_BASED_OUTPATIENT_CLINIC_OR_DEPARTMENT_OTHER): Payer: Self-pay | Admitting: Urology

## 2021-04-14 NOTE — Progress Notes (Addendum)
Spoke w/ via phone for pre-op interview--- pt ?Lab needs dos---- istat              ?Lab results------ current ekg in epic/ chart ?COVID test -----patient states asymptomatic no test needed ?Arrive at ------- 0930 on 04-19-2021 ?NPO after MN NO Solid Food.  Clear liquids from MN until--- 0830 ?Med rec completed ?Medications to take morning of surgery ----- coreg, fenofibrate, saline nasal spray ?Diabetic medication ----- do not take jardiance morning of surgery ?Patient instructed no nail polish to be worn day of surgery ?Patient instructed to bring photo id and insurance card day of surgery ?Patient aware to have Driver (ride ) / caregiver  for 24 hours after surgery --sister, sybil kunkel ?Patient Special Instructions ----- will do one fleet enema night before surgery ?Pre-Op special Istructions ----- sent / requested device orders via inbox message to [facility] for ICD/  Dr Jenita Seashore MDA gave ok to proceed at [facility] on 03-30-2021 (refer to [facility] RN progress note) ?Patient verbalized understanding of instructions that were given at this phone interview. ?Patient denies shortness of breath, chest pain, fever, cough at this phone interview.  ? ? ?Anesthesia Review: HTN; CAD s/p CABG x4 07/ 2014, per cath 02/ 2015 3V disease, all grafts patent;  chronic systolic CHF;  ICM per last echo , ef 60-65%;  Thrombocytopenia/ secondary polycythemia;  OSA non-compliant with cpap, intolerant;  CKD 3;  DM 2;  peripheral edema lower extremities;  Pt started antibiotic 04-12-2021 for left great toe infection ? ?Pt denies cardaic s&s, stated edema stable, and sob with stairs/ long distance walk..  pt stated last taken a nitro 2015. ? ?PCP: Dr Particia Nearing ?Cardiologist : primary Dr Linard Millers (lov 04-08-2021 epic)  EP, Dr Darnell Level. Lovena Le [facility] 01-03-2021 epic ?Oncology/ hematology:  Dr Mamie Nick. Iruku (lov 02-03-2021 epic) ?OSA followed by Dr T. Turner-- lov 01-28-2020 ? ?Last pacer/ icd check  02-21-2021 epic ?Chest x-ray : 2018 ?EKG  : 01-03-2021 epic ?Echo : 02-02-2020 epic ?Stress test:  07/ 2014 epic ?Cardiac Cath :  04-04-2013 epic ?Activity level: see above ?Sleep Study/ CPAP : Yes/  No ?Fasting Blood Sugar :   90--150   / Checks Blood Sugar -- times a day:  daily am fasting ? ?Blood Thinner/ Instructions /Last Dose: NO ?ASA / Instructions/ Last Dose :  ASA 81mg /  pt stated given instructions to stop prior to surgery by dr Milford Cage office, last dose 04-12-2021 ?

## 2021-04-15 ENCOUNTER — Encounter: Payer: Self-pay | Admitting: Internal Medicine

## 2021-04-15 NOTE — Progress Notes (Signed)
PERIOPERATIVE PRESCRIPTION FOR IMPLANTED CARDIAC DEVICE PROGRAMMING ? ?Patient Information: ?Name:  Kenneth Giles  ?DOB:  [birth year]  ?MRN:  [id number]  ?  ?Planned Procedure:  Gold seed prostate implants/ Space OAR instillation for prostate cancer  ?Surgeon:  Dr Harold Barban  ?Date of Procedure:  04-19-2021  ?Cautery will be used.  ?Position during surgery:  lithotomy  ? ?Please send documentation back to:  ?Day (Fax # [phone])   ?Device Information: ? ?Clinic EP Physician:  Cristopher Peru, MD  ? ?Device Type:  Defibrillator ?Armed forces logistics/support/administrative officer #:  [employer]: [phone] ?Pacemaker Dependent?:  No. ?Date of Last Device Check:  02/21/2021 Remote Normal Device Function?:  Yes.   ? ?Electrophysiologist's Recommendations: ? ?Have magnet available. ?Provide continuous ECG monitoring when magnet is used or reprogramming is to be performed.  ?Procedure may interfere with device function.  Magnet should be placed over device during procedure. ? ?Per Device Clinic Standing Orders, ?Wanda Plump, RN  ?1:52 PM 04/15/2021  ?

## 2021-04-18 NOTE — H&P (Signed)
I have prostate cancer.  ?HPI: Kenneth Giles is a 71 year-old male established patient who is here evaluation for treatment of prostate cancer. ? ? ? ?01/25/21: Kenneth Giles returns today in f/u to discuss ADT in preparation for EXRT for his stage T2a N0 M0, GG5, high risk prostate cancer. He has seen Dr. Alinda Money and Dr. Tammi Klippel.  ? ? ?12/31/20: Kenneth Giles returns today to discuss his prostate biopsy and staging results.  ? ?PSA 7.84.  ? ?Prostate Volume is 82m  ? ?Path: GG1 LBL, LBM, LMM, LAL  ?GG2 LML, RBL  ?GG5 RBM  ? ?PET negative.  ? ?Stage T2a N0 M0 high risk prostate cancer.  ? ?IPSS is 2.  ? ?  ?AUA Symptom Score: He never has the sensation of not emptying his bladder completely after finishing urinating. Less than 20% of the time he has to urinate again fewer than two hours after he has finished urinating. He does not have to stop and start again several times when he urinates. Less than 20% of the time he finds it difficult to postpone urination. He never has a weak urinary stream. He never has to push or strain to begin urination. He never has to get up to urinate from the time he goes to bed until the time he gets up in the morning.  ? ?Calculated AUA Symptom Score: 2 ? ?  ?ALLERGIES: metformin ?Tylenol with Codeine #4 TABS ?  ? ?MEDICATIONS: Lisinopril 20 mg tablet  ?Aspirin Ec 81 mg tablet, delayed release  ?Atorvastatin Calcium 40 mg tablet  ?Carvedilol 25 mg tablet  ?Citrucel  ?Claritin  ?Famotidine 40 mg tablet  ?Fenofibrate  ?Fish Oil  ?Fluticasone Propionate 50 mcg/actuation spray, suspension  ?Jardiance 10 mg tablet  ?Klor-Con 20 meq packet  ?Lutein 20 mg tablet  ?Men's Multivitamin  ?Nitroglycerin  ?One Daily For Men 50+ Advanced  ?Torsemide 10 mg tablet  ?Vitamin B Complex  ?Vitamin C 500 mg capsule  ?Vitamin D3  ?  ? ?GU PSH: Prostate Needle Biopsy - 11/29/2020 ? ?  ?   ?PSH Notes: arm surgery, Intraoperative transesophageal echocardiogram  ?Radial artery harvest  ?Humerus fracture surgery  ?Tibia  Fracture Surgery  ?CRT-D  ?Fracture Repair  ?Cardiac catherization  ?coronary Artery Bypass Graft  ? ?  ? ?NON-GU PSH: CABG (coronary artery bypass grafting) ?Colonoscopy ?Knee Arthroscopy ?Surgical Pathology, Gross And Microscopic Examination For Prostate Needle - 11/29/2020 ? ?  ? ?GU PMH: Prostate Cancer - 01/11/2021, T2a N0 M0 GG5 prostate cancer in a 534mprostate. 1 core with GG5 and 6 with GG2 and GG1. He has mild/mod LUTS with preexisting ED. He is not a good candidate for AS. I discussed RALP and EXRT w/wo seeds with SpaceOAR and ADT x 1842moe has DM and CAD so I would use firmagon or orgovyx for the ADT. I have reviewed the risks and benefits of these options in detail and lean toward ADT and radiation but [facility] treatment considerations. , - 12/31/2020 ?ED due to arterial insufficiency - 12/31/2020, He has probable vascular ED but doesn't have a partner at this time. , - 2018 ?Elevated PSA - 12/31/2020, - 11/29/2020, His PSA has been rising slowly since in fell to 2.81 after his visit with me in 2018. He has a small right prostate nodule now and needs a biopsy. I have reviewed the risks of bleeding, infection and diffiiculty voiding. Levaquin sent for the prep. , - 08/23/2020, He has  a slowly rising PSA that is up to 4.02. His exam is benign and he has multiple comorbidities. I am going to repeat a free and total PSA and if still elevated will get him set up for a biopsy. I have reviewed the risks of bleeding, infection and voiding difficulty. , - 2018 ?Prostate nodule w/ LUTS - 12/31/2020, - 11/29/2020, He has moderate LUTS with irritative symptoms. , - 08/23/2020 ?Nocturia - 08/23/2020 ?Urinary Urgency - 08/23/2020 ?BPH w/o LUTS - 2018 ?  ? ?NON-GU PMH: Arthritis ?Coronary Artery Disease ?Diabetes Type 2 ?Diverticulosis ?GERD ?Hypercholesterolemia ?Hypertension ?Skin Cancer, History ?Sleep Apnea ?  ? ?FAMILY HISTORY: Cancer - Mother  ? ?SOCIAL HISTORY: Marital Status:  Widowed ?Preferred Language: English; Race: White ?Current Smoking Status: Patient has never smoked.  ? ?Tobacco Use Assessment Completed: Used Tobacco in last 30 days? ?Drinks 1 drink per day.  ?Drinks 3 caffeinated drinks per day. ?  ?  Notes: Fire alarm maintenance. He lives alone and has minimal social support.   ? ?REVIEW OF SYSTEMS:    ?GU Review Male:   Patient reports hard to postpone urination and get up at night to urinate. Patient denies frequent urination, burning/ pain with urination, leakage of urine, stream starts and stops, trouble starting your stream, have to strain to urinate , erection problems, and penile pain.  ?Gastrointestinal (Upper):   Patient denies nausea, vomiting, and indigestion/ heartburn.  ?Gastrointestinal (Lower):   Patient denies diarrhea and constipation.  ?Constitutional:   Patient denies fever, night sweats, weight loss, and fatigue.  ?Skin:   Patient denies skin rash/ lesion and itching.  ?Eyes:   Patient denies blurred vision and double vision.  ?Ears/ Nose/ Throat:   Patient denies sore throat and sinus problems.  ?Hematologic/Lymphatic:   Patient denies swollen glands and easy bruising.  ?Cardiovascular:   Patient denies leg swelling and chest pains.  ?Respiratory:   Patient denies cough and shortness of breath.  ?Endocrine:   Patient denies excessive thirst.  ?Musculoskeletal:   Patient denies back pain and joint pain.  ?Neurological:   Patient denies headaches and dizziness.  ?Psychologic:   Patient denies depression and anxiety.  ? ?VITAL SIGNS: None  ? ?Complexity of Data:  ?Records Review:   Previous Doctor Records, Previous Patient Records  ?Urine Test Review:   Urinalysis  ? 07/02/20 01/06/20 09/09/19 09/03/18 08/24/17 12/15/16 09/13/16  ?PSA  ?Total PSA 7.84 ng/ml 4.6 ng/ml 4.67 ng/ml 3.58 ng/ml 3.42 ng/ml 2.81 ng/mL 3.12 ng/mL  ? ?Notes:                     I have reviewed records from Dr. Tammi Klippel and Dr. Alinda Money.   ? ?PROCEDURES:    ? ?     Urinalysis ?Dipstick  Dipstick Cont'd  ?Color: Yellow Bilirubin: Neg mg/dL  ?Appearance: Clear Ketones: Neg mg/dL  ?Specific Gravity: 1.015 Blood: Neg ery/uL  ?pH: <=5.0 Protein: Neg mg/dL  ?Glucose: 3+ mg/dL Urobilinogen: 0.2 mg/dL  ?  Nitrites: Neg  ?  Leukocyte Esterase: Neg leu/uL  ? ? ?     Firmagon '240mg'$  - [id number], [phone] ?120 mg on the left and 120 mg on the right side of the stomach   ? ?Qty: 240 Adm. By: Jairo Ben Dezantil  ?Unit: mg Lot No [id number]  ?Route: SQ Exp. Date 03/17/2023  ?Freq: None Mfgr.:   ?Site: None  ? ?ASSESSMENT:  ?    ICD-10 Details  ?1 GU:   Prostate Cancer - C61 Chronic, Life Threatening -  He has elected to proceed with ADT and EXRT. I reviewed the options for ADT including firmagon, Lupron and Orgovyx. I will start him on Firmagon and continue with that since he has a history of CAD. I have reviewed the side effects in detail and will have him start calcium and Vit D. He will return monthly for firmagon for 48moand will be scheduled for Gold seeds and SpaceOAR appropriately for timed for the EXRT. I have reviewed the risks of the SpaceOAR and fiducials including bleeding, infection, rectal complications and thrombosis. He will otherwise see me in 6 months with labs. I will repeat a PSA today since his last was in 5/22.   ?  ? ?PLAN:    ? ? ?      Medications ?Stop Meds: Levofloxacin 750 mg tablet 1 po 1 hour prior to the procedure  Start: 08/23/2020  ?Discontinue: 01/25/2021  - Reason: The medication cycle was completed.  ?  ? ? ?      Orders ?Labs PSA  ? ? ?      Schedule ?Labs: 1 Month - PSA  ?  1 Month - Total Testosterone  ?  6 Months - Total Testosterone  ?  6 Months - PSA  ?Return Visit/Planned Activity: 1 Month - Nurse Visit  ?           Note: firmagon monthly.   ?Return Visit/Planned Activity: 6 Months - Office Visit  ?[facility]: [facility] [address]240mg'$  (Chemo Hormon Antineopl Sq/im) -[phone] ? ? ?      Document ?Letter(s):  Created for Patient:  Clinical Summary  ? ? ?     Notes:   CC: Dr. HLajean Manesand Dr. MLedon Snare   ? ? ?

## 2021-04-19 ENCOUNTER — Ambulatory Visit (HOSPITAL_BASED_OUTPATIENT_CLINIC_OR_DEPARTMENT_OTHER): Payer: Medicare Other | Admitting: Anesthesiology

## 2021-04-19 ENCOUNTER — Ambulatory Visit (HOSPITAL_BASED_OUTPATIENT_CLINIC_OR_DEPARTMENT_OTHER)
Admission: RE | Admit: 2021-04-19 | Discharge: 2021-04-19 | Disposition: A | Discharge status: Home / Self Care | Payer: Medicare Other | Attending: Urology | Admitting: Urology

## 2021-04-19 ENCOUNTER — Encounter (HOSPITAL_BASED_OUTPATIENT_CLINIC_OR_DEPARTMENT_OTHER): Payer: Self-pay | Admitting: Urology

## 2021-04-19 ENCOUNTER — Encounter (HOSPITAL_BASED_OUTPATIENT_CLINIC_OR_DEPARTMENT_OTHER)
Admission: RE | Disposition: A | Discharge status: Home / Self Care | Payer: Self-pay | Source: Home / Self Care | Attending: Urology

## 2021-04-19 DIAGNOSIS — G4730 Sleep apnea, unspecified: Secondary | ICD-10-CM | POA: Diagnosis not present

## 2021-04-19 DIAGNOSIS — I509 Heart failure, unspecified: Secondary | ICD-10-CM | POA: Diagnosis not present

## 2021-04-19 DIAGNOSIS — I2510 Atherosclerotic heart disease of native coronary artery without angina pectoris: Secondary | ICD-10-CM | POA: Insufficient documentation

## 2021-04-19 DIAGNOSIS — I499 Cardiac arrhythmia, unspecified: Secondary | ICD-10-CM | POA: Insufficient documentation

## 2021-04-19 DIAGNOSIS — Z95810 Presence of automatic (implantable) cardiac defibrillator: Secondary | ICD-10-CM | POA: Diagnosis not present

## 2021-04-19 DIAGNOSIS — Z951 Presence of aortocoronary bypass graft: Secondary | ICD-10-CM | POA: Insufficient documentation

## 2021-04-19 DIAGNOSIS — C61 Malignant neoplasm of prostate: Secondary | ICD-10-CM

## 2021-04-19 DIAGNOSIS — E119 Type 2 diabetes mellitus without complications: Secondary | ICD-10-CM | POA: Diagnosis not present

## 2021-04-19 DIAGNOSIS — Z7984 Long term (current) use of oral hypoglycemic drugs: Secondary | ICD-10-CM | POA: Insufficient documentation

## 2021-04-19 DIAGNOSIS — Z79899 Other long term (current) drug therapy: Secondary | ICD-10-CM | POA: Diagnosis not present

## 2021-04-19 DIAGNOSIS — I110 Hypertensive heart disease with heart failure: Secondary | ICD-10-CM | POA: Insufficient documentation

## 2021-04-19 DIAGNOSIS — K219 Gastro-esophageal reflux disease without esophagitis: Secondary | ICD-10-CM | POA: Insufficient documentation

## 2021-04-19 HISTORY — DX: Diverticulosis of large intestine without perforation or abscess without bleeding: K57.30

## 2021-04-19 HISTORY — DX: Chronic kidney disease, stage 3 unspecified: N18.30

## 2021-04-19 HISTORY — PX: SPACE OAR INSTILLATION: SHX6769

## 2021-04-19 HISTORY — DX: Other constipation: K59.09

## 2021-04-19 HISTORY — DX: Secondary polycythemia: D75.1

## 2021-04-19 HISTORY — DX: Unspecified osteoarthritis, unspecified site: M19.90

## 2021-04-19 HISTORY — DX: Nonrheumatic aortic (valve) insufficiency: I35.1

## 2021-04-19 HISTORY — DX: Mixed hyperlipidemia: E78.2

## 2021-04-19 HISTORY — DX: Other seasonal allergic rhinitis: J30.2

## 2021-04-19 HISTORY — DX: Presence of external hearing-aid: Z97.4

## 2021-04-19 HISTORY — PX: GOLD SEED IMPLANT: SHX6343

## 2021-04-19 HISTORY — DX: Deviated nasal septum: J34.2

## 2021-04-19 HISTORY — DX: Benign prostatic hyperplasia with lower urinary tract symptoms: N40.1

## 2021-04-19 HISTORY — DX: Malignant neoplasm of prostate: C61

## 2021-04-19 LAB — GLUCOSE, CAPILLARY: Glucose-Capillary: 116 mg/dL — ABNORMAL HIGH (ref 70–99)

## 2021-04-19 LAB — POCT I-STAT, CHEM 8
BUN: 22 mg/dL (ref 8–23)
Calcium, Ion: 1.21 mmol/L (ref 1.15–1.40)
Chloride: 106 mmol/L (ref 98–111)
Creatinine, Ser: 1.1 mg/dL (ref 0.61–1.24)
Glucose, Bld: 138 mg/dL — ABNORMAL HIGH (ref 70–99)
HCT: 44 % (ref 39.0–52.0)
Hemoglobin: 15 g/dL (ref 13.0–17.0)
Potassium: 3.9 mmol/L (ref 3.5–5.1)
Sodium: 142 mmol/L (ref 135–145)
TCO2: 23 mmol/L (ref 22–32)

## 2021-04-19 SURGERY — INSERTION, GOLD SEEDS
Anesthesia: Monitor Anesthesia Care

## 2021-04-19 MED ORDER — LIDOCAINE HCL 1 % IJ SOLN
INTRAMUSCULAR | Status: DC | PRN
  Administered 2021-04-19: 15 mL

## 2021-04-19 MED ORDER — ACETAMINOPHEN 500 MG PO TABS
1000.0000 mg | ORAL_TABLET | Freq: Once | ORAL | Status: AC
  Administered 2021-04-19: 1000 mg via ORAL

## 2021-04-19 MED ORDER — PROPOFOL 500 MG/50ML IV EMUL
INTRAVENOUS | Status: DC | PRN
  Administered 2021-04-19: 125 ug/kg/min via INTRAVENOUS

## 2021-04-19 MED ORDER — MIDAZOLAM HCL 5 MG/5ML IJ SOLN
INTRAMUSCULAR | Status: DC | PRN
  Administered 2021-04-19: 1 mg via INTRAVENOUS

## 2021-04-19 MED ORDER — PHENYLEPHRINE 40 MCG/ML (10ML) SYRINGE FOR IV PUSH (FOR BLOOD PRESSURE SUPPORT)
PREFILLED_SYRINGE | INTRAVENOUS | Status: DC | PRN
Start: 2021-04-19 — End: 2021-04-19
  Administered 2021-04-19 (×3): 80 ug via INTRAVENOUS
  Administered 2021-04-19: 40 ug via INTRAVENOUS
  Administered 2021-04-19: 80 ug via INTRAVENOUS

## 2021-04-19 MED ORDER — SODIUM CHLORIDE (PF) 0.9 % IJ SOLN
INTRAMUSCULAR | Status: DC | PRN
  Administered 2021-04-19: 10 mL

## 2021-04-19 MED ORDER — MIDAZOLAM HCL 2 MG/2ML IJ SOLN
INTRAMUSCULAR | Status: AC
  Filled 2021-04-19: qty 2

## 2021-04-19 MED ORDER — FLEET ENEMA 7-19 GM/118ML RE ENEM: 1.0000 | ENEMA | Freq: Once | RECTAL | Status: DC

## 2021-04-19 MED ORDER — FENTANYL CITRATE (PF) 100 MCG/2ML IJ SOLN
INTRAMUSCULAR | Status: DC | PRN
  Administered 2021-04-19: 50 ug via INTRAVENOUS

## 2021-04-19 MED ORDER — FENTANYL CITRATE (PF) 100 MCG/2ML IJ SOLN
INTRAMUSCULAR | Status: AC
  Filled 2021-04-19: qty 2

## 2021-04-19 MED ORDER — CEFAZOLIN SODIUM-DEXTROSE 2-4 GM/100ML-% IV SOLN
INTRAVENOUS | Status: AC
Start: 2021-04-19 — End: ?
  Filled 2021-04-19: qty 100

## 2021-04-19 MED ORDER — PHENYLEPHRINE 40 MCG/ML (10ML) SYRINGE FOR IV PUSH (FOR BLOOD PRESSURE SUPPORT)
PREFILLED_SYRINGE | INTRAVENOUS | Status: AC
  Filled 2021-04-19: qty 10

## 2021-04-19 MED ORDER — CEFAZOLIN SODIUM-DEXTROSE 2-4 GM/100ML-% IV SOLN
2.0000 g | INTRAVENOUS | Status: AC
  Administered 2021-04-19: 2 g via INTRAVENOUS

## 2021-04-19 MED ORDER — SODIUM CHLORIDE 0.9 % IV SOLN: INTRAVENOUS | Status: DC

## 2021-04-19 MED ORDER — ACETAMINOPHEN 500 MG PO TABS
ORAL_TABLET | ORAL | Status: AC
  Filled 2021-04-19: qty 2

## 2021-04-19 MED ORDER — FENTANYL CITRATE (PF) 100 MCG/2ML IJ SOLN: 25.0000 ug | INTRAMUSCULAR | Status: DC | PRN

## 2021-04-19 SURGICAL SUPPLY — 19 items
COVER BACK TABLE 60X90IN (DRAPES) ×2 IMPLANT
DRSG TEGADERM 4X4.75 (GAUZE/BANDAGES/DRESSINGS) ×3 IMPLANT
DRSG TEGADERM 8X12 (GAUZE/BANDAGES/DRESSINGS) ×4 IMPLANT
GAUZE SPONGE 4X4 12PLY STRL LF (GAUZE/BANDAGES/DRESSINGS) ×1 IMPLANT
GLOVE SURG ENC MOIS LTX SZ7.5 (GLOVE) ×2 IMPLANT
GLOVE SURG LTX SZ8 (GLOVE) ×2 IMPLANT
GLOVE SURG ORTHO LTX SZ8.5 (GLOVE) ×2 IMPLANT
GOWN STRL REUS W/TWL XL LVL3 (GOWN DISPOSABLE) ×2 IMPLANT
IMPL SPACEOAR VUE SYSTEM (Spacer) ×1 IMPLANT
IMPLANT SPACEOAR VUE SYSTEM (Spacer) ×2 IMPLANT
KIT TURNOVER CYSTO (KITS) ×2 IMPLANT
MARKER GOLD PRELOAD 1.2X3 (Urological Implant) ×1 IMPLANT
NDL SPNL 22GX7 QUINCKE BK (NEEDLE) IMPLANT
NEEDLE SPNL 22GX7 QUINCKE BK (NEEDLE) ×2 IMPLANT
PACK CYSTO (CUSTOM PROCEDURE TRAY) ×2 IMPLANT
SEED GOLD PRELOAD 1.2X3 (Urological Implant) ×2 IMPLANT
SURGILUBE 2OZ TUBE FLIPTOP (MISCELLANEOUS) ×2 IMPLANT
SYR 20ML LL LF (SYRINGE) IMPLANT
UNDERPAD 30X36 HEAVY ABSORB (UNDERPADS AND DIAPERS) ×4 IMPLANT

## 2021-04-19 NOTE — Anesthesia Preprocedure Evaluation (Addendum)
Anesthesia Evaluation  ?Patient identified by MRN, date of birth, ID band ?Patient awake ? ? ? ?Reviewed: ?Allergy & Precautions, H&P , NPO status , Patient's Chart, lab work & pertinent test results ? ?Airway ?Mallampati: III ? ?TM Distance: >3 FB ?Neck ROM: Full ? ? ? Dental ?no notable dental hx. ?(+) Teeth Intact, Dental Advisory Given ?  ?Pulmonary ?sleep apnea ,  ?  ?Pulmonary exam normal ?breath sounds clear to auscultation ? ? ? ? ? ? Cardiovascular ?hypertension, Pt. on medications ?+ CAD, + CABG and +CHF  ?+ dysrhythmias + Cardiac Defibrillator ? ?Rhythm:Regular Rate:Normal ? ? ?  ?Neuro/Psych ?negative neurological ROS ? negative psych ROS  ? GI/Hepatic ?Neg liver ROS, GERD  Medicated,  ?Endo/Other  ?diabetes, Type 2, Oral Hypoglycemic Agents ? Renal/GU ?Renal disease  ?negative genitourinary ?  ?Musculoskeletal ? ?(+) Arthritis , Osteoarthritis,   ? Abdominal ?  ?Peds ? Hematology ?negative hematology ROS ?(+)   ?Anesthesia Other Findings ? ? Reproductive/Obstetrics ?negative OB ROS ? ?  ? ? ? ? ? ? ? ? ? ? ? ? ? ?  ?  ? ? ? ? ? ? ? ?Anesthesia Physical ?Anesthesia Plan ? ?ASA: 3 ? ?Anesthesia Plan: MAC  ? ?Post-op Pain Management: Tylenol PO (pre-op)* and Minimal or no pain anticipated  ? ?Induction: Intravenous ? ?PONV Risk Score and Plan: 2 and Propofol infusion ? ?Airway Management Planned: Natural Airway and Simple Face Mask ? ?Additional Equipment:  ? ?Intra-op Plan:  ? ?Post-operative Plan:  ? ?Informed Consent: I have reviewed the patients History and Physical, chart, labs and discussed the procedure including the risks, benefits and alternatives for the proposed anesthesia with the patient or authorized representative who has indicated his/her understanding and acceptance.  ? ? ? ?Dental advisory given ? ?Plan Discussed with: CRNA ? ?Anesthesia Plan Comments:   ? ? ? ? ? ? ?Anesthesia Quick Evaluation ? ?

## 2021-04-19 NOTE — Anesthesia Postprocedure Evaluation (Signed)
Anesthesia Post Note ? ?Patient: DARRAN GABAY ? ?Procedure(s) Performed: GOLD SEED IMPLANT ?SPACE OAR INSTILLATION ? ?  ? ?Patient location during evaluation: PACU ?Anesthesia Type: MAC ?Level of consciousness: awake and alert ?Pain management: pain level controlled ?Vital Signs Assessment: post-procedure vital signs reviewed and stable ?Respiratory status: spontaneous breathing, nonlabored ventilation and respiratory function stable ?Cardiovascular status: stable and blood pressure returned to baseline ?Postop Assessment: no apparent nausea or vomiting ?Anesthetic complications: no ? ? ?No notable events documented. ? ?Last Vitals:  ?Vitals:  ? 04/19/21 1200 04/19/21 1209  ?BP: (!) 102/47 126/68  ?Pulse: (!) 59 60  ?Resp: 15 15  ?Temp:    ?SpO2: 97% 96%  ?  ?Last Pain:  ?Vitals:  ? 04/19/21 1200  ?TempSrc:   ?PainSc: 0-No pain  ? ? ?  ?  ?  ?  ?  ?  ? ?Saharra Santo,W. EDMOND ? ? ? ? ?

## 2021-04-19 NOTE — Interval H&P Note (Signed)
History and Physical Interval Note: ? ?04/19/2021 ?9:38 AM ? ?Kenneth Giles  has presented today for surgery, with the diagnosis of PROSTATE CANCER.  The various methods of treatment have been discussed with the patient and family. After consideration of risks, benefits and other options for treatment, the patient has consented to  Procedure(s) with comments: ?GOLD SEED IMPLANT (N/A) - 30 MINS FOR THIS CASE ?SPACE OAR INSTILLATION (N/A) as a surgical intervention.  The patient's history has been reviewed, patient examined, no change in status, stable for surgery.  I have reviewed the patient's chart and labs.  Questions were answered to the patient's satisfaction.   ? ? ?Remi Haggard ? ? ?

## 2021-04-19 NOTE — Transfer of Care (Signed)
Immediate Anesthesia Transfer of Care Note ? ?Patient: Kenneth Giles ? ?Procedure(s) Performed: GOLD SEED IMPLANT ?SPACE OAR INSTILLATION ? ?Patient Location: PACU ? ?Anesthesia Type:MAC ? ?Level of Consciousness: awake, drowsy and patient cooperative ? ?Airway & Oxygen Therapy: Patient Spontanous Breathing and Patient connected to face mask oxygen ? ?Post-op Assessment: Report given to RN and Post -op Vital signs reviewed and stable ? ?Post vital signs: Reviewed and stable ? ?Last Vitals:  ?Vitals Value Taken Time  ?BP    ?Temp    ?Pulse 60 04/19/21 1135  ?Resp    ?SpO2 98 % 04/19/21 1135  ?Vitals shown include unvalidated device data. ? ?Last Pain:  ?Vitals:  ? 04/19/21 1014  ?TempSrc: Oral  ?PainSc: 0-No pain  ?   ? ?Patients Stated Pain Goal: 5 (04/19/21 1014) ? ?Complications: No notable events documented. ?

## 2021-04-19 NOTE — Discharge Instructions (Signed)
?  Post Anesthesia Home Care Instructions ? ?Activity: ?Get plenty of rest for the remainder of the day. A responsible individual must stay with you for 24 hours following the procedure.  ?For the next 24 hours, DO NOT: ?-Drive a car ?-Paediatric nurse ?-Drink alcoholic beverages ?-Take any medication unless instructed by your physician ?-Make any legal decisions or sign important papers. ? ?Meals: ?Start with liquid foods such as gelatin or soup. Progress to regular foods as tolerated. Avoid greasy, spicy, heavy foods. If nausea and/or vomiting occur, drink only clear liquids until the nausea and/or vomiting subsides. Call your physician if vomiting continues. ? ?Special Instructions/Symptoms: ?Your throat may feel dry or sore from the anesthesia or the breathing tube placed in your throat during surgery. If this causes discomfort, gargle with warm salt water. The discomfort should disappear within 24 hours. ? ?No tylenol/acetaminophen until after 4:15 pm today if needed.  ?   ? ?

## 2021-04-19 NOTE — Op Note (Signed)
Preoperative diagnosis: Clinically localized adenocarcinoma of the prostate (T1c) ? ?Postoperative diagnosis: Clinically localized adenocarcinoma of the prostate ? ?Procedure: 1) Placement of fiducial markers into prostate ?                   2) Insertion of SpaceOAR hydrogel  ? ?Surgeon: Remi Haggard, MD ? ?Radiation oncologist: Tammi Klippel ? ?Anesthesia: MAC ? ?EBL: Minimal ? ?Complications: None ? ?Indication: Kenneth Giles is a 71 y.o. gentleman with clinically localized prostate cancer. After discussing management options for treatment, he elected to proceed with radiotherapy. He presents today for the above procedures. The potential risks, complications, alternative options, and expected recovery course have been discussed in detail with the patient and he has provided informed consent to proceed. ? ?Description of procedure: The patient was administered preoperative antibiotics, placed in the dorsal lithotomy position, and prepped and draped in the usual sterile fashion. Next, transrectal ultrasonography was utilized to visualize the prostate. Three gold fiducial markers were then placed into the prostate via transperineal needles under ultrasound guidance at the left apex, left base, and right mid gland under direct ultrasound guidance. A site in the midline was then selected on the perineum for placement of an 18 g needle with saline. The needle was advanced above the rectum and below Denonvillier's fascia to the mid gland and confirmed to be in the midline on transverse imaging. One cc of saline was injected confirming appropriate expansion of this space. A total of 5 cc of saline was then injected to open the space further bilaterally. The saline syringe was then removed and the SpaceOAR hydrogel was injected with good distribution bilaterally. He tolerated the procedure well and without complications. He was given a voiding trial prior to discharge from the PACU.  ?

## 2021-04-20 ENCOUNTER — Encounter (HOSPITAL_BASED_OUTPATIENT_CLINIC_OR_DEPARTMENT_OTHER): Payer: Self-pay | Admitting: Urology

## 2021-04-21 ENCOUNTER — Telehealth: Payer: Self-pay | Admitting: *Deleted

## 2021-04-21 NOTE — Telephone Encounter (Signed)
CALLED PATIENT TO REMIND OF SIM APPT. ON 04-25-21- ARRIVAL TIME- 10:45 AM @ [facility], PATIENT INFORMED TO ARRIVE WITH A FULL BLADDER AND AN EMPTY BOWEL, SPOKE WITH PATIENT AND HE IS AWARE OF THIS APPT. ?

## 2021-04-24 NOTE — Progress Notes (Signed)
?  Radiation Oncology         [phone] ?________________________________ ? ?Name: Kenneth Giles MRN: [id number]  ?Date: 04/25/2021  DOB: 1950-08-02 ? ?SIMULATION AND TREATMENT PLANNING NOTE ? ?  ICD-10-CM   ?1. Malignant neoplasm of prostate ([place])  C61   ?  ? ? ?DIAGNOSIS:  72 y.o. gentleman with Stage T2a adenocarcinoma of the prostate with Gleason score of 4+5, and PSA of 7.84. ? ?NARRATIVE:  The patient was brought to the [facility].  Identity was confirmed.  All relevant records and images related to the planned course of therapy were reviewed.  The patient freely provided informed written consent to proceed with treatment after reviewing the details related to the planned course of therapy. The consent form was witnessed and verified by the simulation staff.  Then, the patient was set-up in a stable reproducible supine position for radiation therapy.  A vacuum lock pillow device was custom fabricated to position his legs in a reproducible immobilized position.  Then, I performed a urethrogram under sterile conditions to identify the prostatic bed.  CT images were obtained.  Surface markings were placed.  The CT images were loaded into the planning software.  Then the prostate bed target, pelvic lymph node target and avoidance structures including the rectum, bladder, bowel and hips were contoured.  Treatment planning then occurred.  The radiation prescription was entered and confirmed.  A total of one complex treatment devices were fabricated. I have requested : Intensity Modulated Radiotherapy (IMRT) is medically necessary for this case for the following reason:  Rectal sparing.. ? ?PLAN:  The patient will receive 45 Gy in 25 fractions of 1.8 Gy, followed by a boost to the prostate to a total dose of 75 Gy with 15 additional fractions of 2 Gy. ? ? ?________________________________ ? ?Sheral Apley Tammi Klippel, M.D. ? ? ?

## 2021-04-25 ENCOUNTER — Encounter: Payer: Self-pay | Admitting: Internal Medicine

## 2021-04-25 ENCOUNTER — Other Ambulatory Visit: Payer: Self-pay

## 2021-04-25 ENCOUNTER — Ambulatory Visit
Admission: RE | Admit: 2021-04-25 | Discharge: 2021-04-25 | Disposition: A | Discharge status: Home / Self Care | Payer: Medicare Other | Source: Ambulatory Visit | Attending: Radiation Oncology | Admitting: Radiation Oncology

## 2021-04-25 DIAGNOSIS — Z510 Encounter for antineoplastic radiation therapy: Secondary | ICD-10-CM | POA: Diagnosis not present

## 2021-04-25 DIAGNOSIS — C61 Malignant neoplasm of prostate: Secondary | ICD-10-CM | POA: Insufficient documentation

## 2021-04-25 NOTE — Progress Notes (Signed)
TO BE COMPLETED BY RADIATION ONCOLOGIST OFFICE:  ? ?Patient Name: Kenneth Giles  ? ?Date of Birth: [birth year]  ? ? Patient Name: [name], [name].  ? ?Date of Birth: Oct 23, 1950  ? ?Radiation Oncologist: Dr. Tammi Klippel  ? ?Site to be Treated: Prostate  ? ?Will x-rays >10 MV be used? No  ? ?Will the radiation be >10 cm from the device? Yes  ? ?Planned Treatment Start Date: 1-2 weeks ? ?TO BE COMPLETED BY CARDIOLOGIST OFFICE:  ? ?Device Information:  Pacemaker '[]'$      ICD '[x]'$   ? ?Brand: St. Jude/Abbott: [id number] Model #: 3369-40C Barnie Mort MP ? ?Serial Number: [id number]     Date of Placement: 12/29/2011 ? ?Site of Placement: L. Chest  ? ?Remote Device Check--Frequency: 91 days   Last Check: 02/21/2021 ? ?Is the Patient Pacer Dependent?:  Yes '[]'$   No '[x]'$  ? ?Does cardiologist request Radiation Oncology to schedule device testing by vendor for the following:  ?Prior to the Initiation of Treatments?  Yes '[]'$  No '[x]'$  ?During Treatments?  Yes '[]'$  No '[x]'$  ?Post Radiation Treatments?  Yes '[]'$  No '[x]'$  ? ?Is device monitoring necessary by vendor/cardiologist team during treatments?  ?Yes '[]'$   No '[x]'$  ? ?Is cardiac monitoring by Radiation Oncology nursing necessary during treatments? ?Yes '[x]'$   No '[]'$  ? ?Do you recommend device be relocated prior to Radiation Treatment? ?Yes '[]'$   No '[x]'$  ? ?**PLEASE LIST ANY NOTES OR SPECIAL REQUESTS: ? ? ? ?  ? ?CARDIOLOGIST SIGNATURE:  Dr. Cristopher Peru ?Per Device Clinic Standing Orders, ?Wanda Plump  ?04/25/2021 ?2:54 PM ? ?**Please route completed form back to Radiation Oncology Nursing and "P [facility] RAD ONC ADMIN", OR send an update if there will be a delay in having form completed by expected start date.  ?**Call [phone] if you have any questions or do not get an in-basket response from a Radiation Oncology staff member  ? ? ? ? ? ? ? ? ? ? ? ?  ?

## 2021-04-27 DIAGNOSIS — Z510 Encounter for antineoplastic radiation therapy: Secondary | ICD-10-CM | POA: Diagnosis not present

## 2021-04-28 ENCOUNTER — Other Ambulatory Visit: Payer: Self-pay | Admitting: Interventional Cardiology

## 2021-05-02 ENCOUNTER — Ambulatory Visit: Payer: Medicare Other

## 2021-05-03 ENCOUNTER — Ambulatory Visit: Payer: Medicare Other

## 2021-05-04 ENCOUNTER — Other Ambulatory Visit: Payer: Self-pay

## 2021-05-04 ENCOUNTER — Ambulatory Visit
Admission: RE | Admit: 2021-05-04 | Discharge: 2021-05-04 | Disposition: A | Discharge status: Home / Self Care | Payer: Medicare Other | Source: Ambulatory Visit | Attending: Radiation Oncology | Admitting: Radiation Oncology

## 2021-05-04 DIAGNOSIS — Z510 Encounter for antineoplastic radiation therapy: Secondary | ICD-10-CM | POA: Diagnosis not present

## 2021-05-05 ENCOUNTER — Ambulatory Visit
Admission: RE | Admit: 2021-05-05 | Discharge: 2021-05-05 | Disposition: A | Discharge status: Home / Self Care | Payer: Medicare Other | Source: Ambulatory Visit | Attending: Radiation Oncology | Admitting: Radiation Oncology

## 2021-05-05 DIAGNOSIS — Z510 Encounter for antineoplastic radiation therapy: Secondary | ICD-10-CM | POA: Diagnosis not present

## 2021-05-06 ENCOUNTER — Other Ambulatory Visit: Payer: Self-pay

## 2021-05-06 ENCOUNTER — Ambulatory Visit
Admission: RE | Admit: 2021-05-06 | Discharge: 2021-05-06 | Disposition: A | Discharge status: Home / Self Care | Payer: Medicare Other | Source: Ambulatory Visit | Attending: Radiation Oncology | Admitting: Radiation Oncology

## 2021-05-06 DIAGNOSIS — Z510 Encounter for antineoplastic radiation therapy: Secondary | ICD-10-CM | POA: Diagnosis not present

## 2021-05-09 ENCOUNTER — Other Ambulatory Visit: Payer: Self-pay

## 2021-05-09 ENCOUNTER — Ambulatory Visit
Admission: RE | Admit: 2021-05-09 | Discharge: 2021-05-09 | Disposition: A | Discharge status: Home / Self Care | Payer: Medicare Other | Source: Ambulatory Visit | Attending: Radiation Oncology | Admitting: Radiation Oncology

## 2021-05-09 DIAGNOSIS — Z510 Encounter for antineoplastic radiation therapy: Secondary | ICD-10-CM | POA: Diagnosis not present

## 2021-05-10 ENCOUNTER — Ambulatory Visit
Admission: RE | Admit: 2021-05-10 | Discharge: 2021-05-10 | Disposition: A | Discharge status: Home / Self Care | Payer: Medicare Other | Source: Ambulatory Visit | Attending: Radiation Oncology | Admitting: Radiation Oncology

## 2021-05-10 DIAGNOSIS — C61 Malignant neoplasm of prostate: Secondary | ICD-10-CM

## 2021-05-10 DIAGNOSIS — Z510 Encounter for antineoplastic radiation therapy: Secondary | ICD-10-CM | POA: Diagnosis not present

## 2021-05-11 ENCOUNTER — Other Ambulatory Visit: Payer: Self-pay

## 2021-05-11 ENCOUNTER — Ambulatory Visit
Admission: RE | Admit: 2021-05-11 | Discharge: 2021-05-11 | Disposition: A | Discharge status: Home / Self Care | Payer: Medicare Other | Source: Ambulatory Visit | Attending: Radiation Oncology | Admitting: Radiation Oncology

## 2021-05-11 DIAGNOSIS — Z510 Encounter for antineoplastic radiation therapy: Secondary | ICD-10-CM | POA: Diagnosis not present

## 2021-05-12 ENCOUNTER — Ambulatory Visit
Admission: RE | Admit: 2021-05-12 | Discharge: 2021-05-12 | Disposition: A | Discharge status: Home / Self Care | Payer: Medicare Other | Source: Ambulatory Visit | Attending: Radiation Oncology | Admitting: Radiation Oncology

## 2021-05-12 DIAGNOSIS — Z510 Encounter for antineoplastic radiation therapy: Secondary | ICD-10-CM | POA: Diagnosis not present

## 2021-05-13 ENCOUNTER — Ambulatory Visit
Admission: RE | Admit: 2021-05-13 | Discharge: 2021-05-13 | Disposition: A | Discharge status: Home / Self Care | Payer: Medicare Other | Source: Ambulatory Visit | Attending: Radiation Oncology | Admitting: Radiation Oncology

## 2021-05-13 ENCOUNTER — Other Ambulatory Visit: Payer: Self-pay

## 2021-05-13 DIAGNOSIS — Z510 Encounter for antineoplastic radiation therapy: Secondary | ICD-10-CM | POA: Diagnosis not present

## 2021-05-16 ENCOUNTER — Other Ambulatory Visit: Payer: Self-pay

## 2021-05-16 ENCOUNTER — Ambulatory Visit
Admission: RE | Admit: 2021-05-16 | Discharge: 2021-05-16 | Disposition: A | Discharge status: Home / Self Care | Payer: Medicare Other | Source: Ambulatory Visit | Attending: Radiation Oncology | Admitting: Radiation Oncology

## 2021-05-16 DIAGNOSIS — Z510 Encounter for antineoplastic radiation therapy: Secondary | ICD-10-CM | POA: Diagnosis present

## 2021-05-16 DIAGNOSIS — C61 Malignant neoplasm of prostate: Secondary | ICD-10-CM | POA: Insufficient documentation

## 2021-05-17 ENCOUNTER — Ambulatory Visit
Admission: RE | Admit: 2021-05-17 | Discharge: 2021-05-17 | Disposition: A | Discharge status: Home / Self Care | Payer: Medicare Other | Source: Ambulatory Visit | Attending: Radiation Oncology | Admitting: Radiation Oncology

## 2021-05-17 DIAGNOSIS — Z510 Encounter for antineoplastic radiation therapy: Secondary | ICD-10-CM | POA: Diagnosis not present

## 2021-05-18 ENCOUNTER — Other Ambulatory Visit: Payer: Self-pay

## 2021-05-18 ENCOUNTER — Ambulatory Visit
Admission: RE | Admit: 2021-05-18 | Discharge: 2021-05-18 | Disposition: A | Discharge status: Home / Self Care | Payer: Medicare Other | Source: Ambulatory Visit | Attending: Radiation Oncology | Admitting: Radiation Oncology

## 2021-05-18 DIAGNOSIS — Z510 Encounter for antineoplastic radiation therapy: Secondary | ICD-10-CM | POA: Diagnosis not present

## 2021-05-19 ENCOUNTER — Ambulatory Visit
Admission: RE | Admit: 2021-05-19 | Discharge: 2021-05-19 | Disposition: A | Discharge status: Home / Self Care | Payer: Medicare Other | Source: Ambulatory Visit | Attending: Radiation Oncology | Admitting: Radiation Oncology

## 2021-05-19 DIAGNOSIS — Z510 Encounter for antineoplastic radiation therapy: Secondary | ICD-10-CM | POA: Diagnosis not present

## 2021-05-20 ENCOUNTER — Other Ambulatory Visit: Payer: Self-pay

## 2021-05-20 ENCOUNTER — Ambulatory Visit
Admission: RE | Admit: 2021-05-20 | Discharge: 2021-05-20 | Disposition: A | Discharge status: Home / Self Care | Payer: Medicare Other | Source: Ambulatory Visit | Attending: Radiation Oncology | Admitting: Radiation Oncology

## 2021-05-20 DIAGNOSIS — Z510 Encounter for antineoplastic radiation therapy: Secondary | ICD-10-CM | POA: Diagnosis not present

## 2021-05-23 ENCOUNTER — Ambulatory Visit (INDEPENDENT_AMBULATORY_CARE_PROVIDER_SITE_OTHER): Payer: Medicare Other

## 2021-05-23 ENCOUNTER — Other Ambulatory Visit: Payer: Self-pay

## 2021-05-23 ENCOUNTER — Ambulatory Visit
Admission: RE | Admit: 2021-05-23 | Discharge: 2021-05-23 | Disposition: A | Discharge status: Home / Self Care | Payer: Medicare Other | Source: Ambulatory Visit | Attending: Radiation Oncology | Admitting: Radiation Oncology

## 2021-05-23 DIAGNOSIS — I255 Ischemic cardiomyopathy: Secondary | ICD-10-CM | POA: Diagnosis not present

## 2021-05-23 DIAGNOSIS — Z510 Encounter for antineoplastic radiation therapy: Secondary | ICD-10-CM | POA: Diagnosis not present

## 2021-05-24 ENCOUNTER — Ambulatory Visit
Admission: RE | Admit: 2021-05-24 | Discharge: 2021-05-24 | Disposition: A | Discharge status: Home / Self Care | Payer: Medicare Other | Source: Ambulatory Visit | Attending: Radiation Oncology | Admitting: Radiation Oncology

## 2021-05-24 DIAGNOSIS — Z510 Encounter for antineoplastic radiation therapy: Secondary | ICD-10-CM | POA: Diagnosis not present

## 2021-05-24 LAB — CUP PACEART REMOTE DEVICE CHECK
Battery Remaining Longevity: 41 mo
Battery Remaining Percentage: 46 %
Battery Voltage: 2.93 V
Brady Statistic AP VP Percent: 54 %
Brady Statistic AP VS Percent: 1 %
Brady Statistic AS VP Percent: 46 %
Brady Statistic AS VS Percent: 1 %
Brady Statistic RA Percent Paced: 54 %
Date Time Interrogation Session: 20230410020017
HighPow Impedance: 73 Ohm
HighPow Impedance: 73 Ohm
Implantable Lead Implant Date: 20181115
Implantable Lead Implant Date: 20181115
Implantable Lead Implant Date: 20181115
Implantable Lead Location: 753858
Implantable Lead Location: 753859
Implantable Lead Location: 753860
Implantable Lead Model: 7122
Implantable Pulse Generator Implant Date: 20181115
Lead Channel Impedance Value: 400 Ohm
Lead Channel Impedance Value: 450 Ohm
Lead Channel Impedance Value: 610 Ohm
Lead Channel Pacing Threshold Amplitude: 0.5 V
Lead Channel Pacing Threshold Amplitude: 0.625 V
Lead Channel Pacing Threshold Amplitude: 0.75 V
Lead Channel Pacing Threshold Pulse Width: 0.5 ms
Lead Channel Pacing Threshold Pulse Width: 0.5 ms
Lead Channel Pacing Threshold Pulse Width: 0.5 ms
Lead Channel Sensing Intrinsic Amplitude: 11.7 mV
Lead Channel Sensing Intrinsic Amplitude: 3.9 mV
Lead Channel Setting Pacing Amplitude: 1.5 V
Lead Channel Setting Pacing Amplitude: 2 V
Lead Channel Setting Pacing Amplitude: 2 V
Lead Channel Setting Pacing Pulse Width: 0.5 ms
Lead Channel Setting Pacing Pulse Width: 0.5 ms
Lead Channel Setting Sensing Sensitivity: 0.5 mV
Pulse Gen Serial Number: 9780956

## 2021-05-25 ENCOUNTER — Other Ambulatory Visit: Payer: Self-pay

## 2021-05-25 ENCOUNTER — Ambulatory Visit
Admission: RE | Admit: 2021-05-25 | Discharge: 2021-05-25 | Disposition: A | Discharge status: Home / Self Care | Payer: Medicare Other | Source: Ambulatory Visit | Attending: Radiation Oncology | Admitting: Radiation Oncology

## 2021-05-25 DIAGNOSIS — Z510 Encounter for antineoplastic radiation therapy: Secondary | ICD-10-CM | POA: Diagnosis not present

## 2021-05-26 ENCOUNTER — Ambulatory Visit
Admission: RE | Admit: 2021-05-26 | Discharge: 2021-05-26 | Disposition: A | Discharge status: Home / Self Care | Payer: Medicare Other | Source: Ambulatory Visit | Attending: Radiation Oncology | Admitting: Radiation Oncology

## 2021-05-26 DIAGNOSIS — Z510 Encounter for antineoplastic radiation therapy: Secondary | ICD-10-CM | POA: Diagnosis not present

## 2021-05-27 ENCOUNTER — Ambulatory Visit
Admission: RE | Admit: 2021-05-27 | Discharge: 2021-05-27 | Disposition: A | Discharge status: Home / Self Care | Payer: Medicare Other | Source: Ambulatory Visit | Attending: Radiation Oncology | Admitting: Radiation Oncology

## 2021-05-27 ENCOUNTER — Other Ambulatory Visit: Payer: Self-pay

## 2021-05-27 DIAGNOSIS — Z510 Encounter for antineoplastic radiation therapy: Secondary | ICD-10-CM | POA: Diagnosis not present

## 2021-05-28 ENCOUNTER — Emergency Department (HOSPITAL_COMMUNITY)
Admission: EM | Admit: 2021-05-28 | Discharge: 2021-05-28 | Disposition: A | Discharge status: Home / Self Care | Payer: Medicare Other | Attending: Emergency Medicine | Admitting: Emergency Medicine

## 2021-05-28 ENCOUNTER — Emergency Department (HOSPITAL_COMMUNITY): Payer: Medicare Other

## 2021-05-28 DIAGNOSIS — Z7982 Long term (current) use of aspirin: Secondary | ICD-10-CM | POA: Diagnosis not present

## 2021-05-28 DIAGNOSIS — Z20822 Contact with and (suspected) exposure to covid-19: Secondary | ICD-10-CM | POA: Diagnosis not present

## 2021-05-28 DIAGNOSIS — M2578 Osteophyte, vertebrae: Secondary | ICD-10-CM | POA: Insufficient documentation

## 2021-05-28 DIAGNOSIS — R7309 Other abnormal glucose: Secondary | ICD-10-CM | POA: Diagnosis not present

## 2021-05-28 DIAGNOSIS — S0990XA Unspecified injury of head, initial encounter: Secondary | ICD-10-CM | POA: Diagnosis not present

## 2021-05-28 DIAGNOSIS — Y92410 Unspecified street and highway as the place of occurrence of the external cause: Secondary | ICD-10-CM | POA: Insufficient documentation

## 2021-05-28 DIAGNOSIS — Z23 Encounter for immunization: Secondary | ICD-10-CM | POA: Insufficient documentation

## 2021-05-28 DIAGNOSIS — S3991XA Unspecified injury of abdomen, initial encounter: Secondary | ICD-10-CM | POA: Diagnosis present

## 2021-05-28 DIAGNOSIS — R21 Rash and other nonspecific skin eruption: Secondary | ICD-10-CM | POA: Diagnosis not present

## 2021-05-28 LAB — URINALYSIS, ROUTINE W REFLEX MICROSCOPIC
Bacteria, UA: NONE SEEN
Bilirubin Urine: NEGATIVE
Glucose, UA: >=500 mg/dL — AB
Hgb urine dipstick: NEGATIVE
Ketones, ur: NEGATIVE mg/dL
Leukocytes,Ua: NEGATIVE
Nitrite: NEGATIVE
Protein, ur: NEGATIVE mg/dL
Specific Gravity, Urine: 1.032 — ABNORMAL HIGH (ref 1.005–1.030)
pH: 7 (ref 5.0–8.0)

## 2021-05-28 LAB — LACTIC ACID, PLASMA: Lactic Acid, Venous: 2.8 mmol/L (ref 0.5–1.9)

## 2021-05-28 LAB — SAMPLE TO BLOOD BANK

## 2021-05-28 LAB — I-STAT CHEM 8, ED
BUN: 21 mg/dL (ref 8–23)
Calcium, Ion: 1.09 mmol/L — ABNORMAL LOW (ref 1.15–1.40)
Chloride: 109 mmol/L (ref 98–111)
Creatinine, Ser: 1.5 mg/dL — ABNORMAL HIGH (ref 0.61–1.24)
Glucose, Bld: 189 mg/dL — ABNORMAL HIGH (ref 70–99)
HCT: 38 % — ABNORMAL LOW (ref 39.0–52.0)
Hemoglobin: 12.9 g/dL — ABNORMAL LOW (ref 13.0–17.0)
Potassium: 3.6 mmol/L (ref 3.5–5.1)
Sodium: 143 mmol/L (ref 135–145)
TCO2: 21 mmol/L — ABNORMAL LOW (ref 22–32)

## 2021-05-28 LAB — CBC
HCT: 41 % (ref 39.0–52.0)
Hemoglobin: 14 g/dL (ref 13.0–17.0)
MCH: 29.8 pg (ref 26.0–34.0)
MCHC: 34.1 g/dL (ref 30.0–36.0)
MCV: 87.2 fL (ref 80.0–100.0)
Platelets: 72 10*3/uL — ABNORMAL LOW (ref 150–400)
RBC: 4.7 MIL/uL (ref 4.22–5.81)
RDW: 13.9 % (ref 11.5–15.5)
WBC: 2.6 10*3/uL — ABNORMAL LOW (ref 4.0–10.5)
nRBC: 0 % (ref 0.0–0.2)

## 2021-05-28 LAB — COMPREHENSIVE METABOLIC PANEL
ALT: 28 U/L (ref 0–44)
AST: 32 U/L (ref 15–41)
Albumin: 3.5 g/dL (ref 3.5–5.0)
Alkaline Phosphatase: 44 U/L (ref 38–126)
Anion gap: 7 (ref 5–15)
BUN: 20 mg/dL (ref 8–23)
CO2: 21 mmol/L — ABNORMAL LOW (ref 22–32)
Calcium: 9.2 mg/dL (ref 8.9–10.3)
Chloride: 112 mmol/L — ABNORMAL HIGH (ref 98–111)
Creatinine, Ser: 1.49 mg/dL — ABNORMAL HIGH (ref 0.61–1.24)
GFR, Estimated: 51 mL/min — ABNORMAL LOW (ref >60)
Glucose, Bld: 184 mg/dL — ABNORMAL HIGH (ref 70–99)
Potassium: 3.6 mmol/L (ref 3.5–5.1)
Sodium: 140 mmol/L (ref 135–145)
Total Bilirubin: 0.7 mg/dL (ref 0.3–1.2)
Total Protein: 6.4 g/dL — ABNORMAL LOW (ref 6.5–8.1)

## 2021-05-28 LAB — RESP PANEL BY RT-PCR (FLU A&B, COVID) ARPGX2
Influenza A by PCR: NEGATIVE
Influenza B by PCR: NEGATIVE
SARS Coronavirus 2 by RT PCR: NEGATIVE

## 2021-05-28 LAB — PROTIME-INR
INR: 1.1 (ref 0.8–1.2)
Prothrombin Time: 13.8 seconds (ref 11.4–15.2)

## 2021-05-28 LAB — CBG MONITORING, ED: Glucose-Capillary: 158 mg/dL — ABNORMAL HIGH (ref 70–99)

## 2021-05-28 MED ORDER — ONDANSETRON HCL 4 MG/2ML IJ SOLN
4.0000 mg | Freq: Once | INTRAMUSCULAR | Status: AC
  Administered 2021-05-28: 4 mg via INTRAVENOUS
  Filled 2021-05-28: qty 2

## 2021-05-28 MED ORDER — MORPHINE SULFATE (PF) 4 MG/ML IV SOLN
4.0000 mg | Freq: Once | INTRAVENOUS | Status: AC
  Administered 2021-05-28: 4 mg via INTRAVENOUS
  Filled 2021-05-28: qty 1

## 2021-05-28 MED ORDER — IOHEXOL 350 MG/ML SOLN
100.0000 mL | Freq: Once | INTRAVENOUS | Status: AC | PRN
  Administered 2021-05-28: 100 mL via INTRAVENOUS

## 2021-05-28 MED ORDER — OXYCODONE-ACETAMINOPHEN 5-325 MG PO TABS
1.0000 | ORAL_TABLET | Freq: Once | ORAL | Status: AC
  Administered 2021-05-28: 1 via ORAL
  Filled 2021-05-28: qty 1

## 2021-05-28 MED ORDER — SODIUM CHLORIDE 0.9 % IV BOLUS
500.0000 mL | Freq: Once | INTRAVENOUS | Status: AC
  Administered 2021-05-28: 500 mL via INTRAVENOUS

## 2021-05-28 MED ORDER — MORPHINE SULFATE (PF) 2 MG/ML IV SOLN
2.0000 mg | Freq: Once | INTRAVENOUS | Status: AC
  Administered 2021-05-28: 2 mg via INTRAVENOUS
  Filled 2021-05-28: qty 1

## 2021-05-28 MED ORDER — TETANUS-DIPHTH-ACELL PERTUSSIS 5-2.5-18.5 LF-MCG/0.5 IM SUSY
0.5000 mL | PREFILLED_SYRINGE | Freq: Once | INTRAMUSCULAR | Status: AC
  Administered 2021-05-28: 0.5 mL via INTRAMUSCULAR
  Filled 2021-05-28: qty 0.5

## 2021-05-28 MED ORDER — MORPHINE SULFATE 15 MG PO TABS: 7.5000 mg | ORAL_TABLET | ORAL | 0 refills | Status: DC | PRN
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
## 2021-05-28 NOTE — ED Notes (Addendum)
Pt was steady but slow on his feet while ambulated  ?
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
## 2021-05-28 NOTE — ED Notes (Signed)
Discharge instructions reviewed with patient and family. Patient and family verbalized understanding of instructions. Follow-up care and medications were reviewed. Patient wheeled out of Ed in wheelchair. VSS upon discharge.  ?

## 2021-05-28 NOTE — ED Notes (Addendum)
Pt's BP dropped to 80/55, this RN alerted the PA as MD Maryan Rued was with another patient at the time. ?

## 2021-05-28 NOTE — ED Provider Notes (Addendum)
Patient cervical flex ex films show no acute findings and they feel that the osteophyte might be chronic and no acute fractures identified.  Findings discussed with the patient we will since keep him in a cervical for collar until he follows up with neurosurgery.  However patient right now is experiencing significant pain and is having trouble moving around or getting up.  He has not had any pain medication for multiple hours.  Will redosed with pain medication to ensure he is safe for discharge home. ? ?8:09 PM ?Pt was able to walk after pain control and is stable for d/c. ? ?8:51 PM ?Unfortunately after pt got up and started walking he became hot and orthostatic and pressure dropped to 80's which did respond to lying down and 580m bolus.  Will feed the pt and attempt to ambulate again.  Suspect it is related to medications. ? ?After eating and sitting up for awhile pt was then able to ambulate and was d/ced home. ? ?  ?PBlanchie Dessert MD ?05/28/21 2009 ? ?  ?PBlanchie Dessert MD ?05/28/21 2347 ? ?
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
## 2021-05-30 ENCOUNTER — Ambulatory Visit
Admission: RE | Admit: 2021-05-30 | Discharge: 2021-05-30 | Disposition: A | Discharge status: Home / Self Care | Payer: Medicare Other | Source: Ambulatory Visit | Attending: Radiation Oncology | Admitting: Radiation Oncology

## 2021-05-30 ENCOUNTER — Other Ambulatory Visit: Payer: Self-pay

## 2021-05-30 DIAGNOSIS — Z510 Encounter for antineoplastic radiation therapy: Secondary | ICD-10-CM | POA: Diagnosis not present

## 2021-05-31 ENCOUNTER — Ambulatory Visit: Payer: Medicare Other

## 2021-05-31 ENCOUNTER — Other Ambulatory Visit: Payer: Self-pay

## 2021-05-31 DIAGNOSIS — Z510 Encounter for antineoplastic radiation therapy: Secondary | ICD-10-CM | POA: Diagnosis not present

## 2021-05-31 LAB — RAD ONC ARIA SESSION SUMMARY
Course Elapsed Days: 27
Plan Fractions Treated to Date: 20
Plan Prescribed Dose Per Fraction: 1.8 Gy
Plan Total Fractions Prescribed: 25
Plan Total Prescribed Dose: 45 Gy
Reference Point Dosage Given to Date: 36 Gy
Reference Point Session Dosage Given: 1.8 Gy
Session Number: 20

## 2021-06-01 ENCOUNTER — Other Ambulatory Visit: Payer: Self-pay

## 2021-06-01 ENCOUNTER — Ambulatory Visit: Payer: Medicare Other

## 2021-06-01 DIAGNOSIS — Z510 Encounter for antineoplastic radiation therapy: Secondary | ICD-10-CM | POA: Diagnosis not present

## 2021-06-01 LAB — RAD ONC ARIA SESSION SUMMARY
Course Elapsed Days: 28
Plan Fractions Treated to Date: 21
Plan Prescribed Dose Per Fraction: 1.8 Gy
Plan Total Fractions Prescribed: 25
Plan Total Prescribed Dose: 45 Gy
Reference Point Dosage Given to Date: 37.8 Gy
Reference Point Session Dosage Given: 1.8 Gy
Session Number: 21

## 2021-06-02 ENCOUNTER — Ambulatory Visit
Admission: RE | Admit: 2021-06-02 | Discharge: 2021-06-02 | Disposition: A | Discharge status: Home / Self Care | Payer: Medicare Other | Source: Ambulatory Visit | Attending: Radiation Oncology | Admitting: Radiation Oncology

## 2021-06-02 ENCOUNTER — Other Ambulatory Visit: Payer: Self-pay

## 2021-06-02 DIAGNOSIS — D696 Thrombocytopenia, unspecified: Secondary | ICD-10-CM

## 2021-06-02 DIAGNOSIS — Z510 Encounter for antineoplastic radiation therapy: Secondary | ICD-10-CM | POA: Diagnosis not present

## 2021-06-02 LAB — RAD ONC ARIA SESSION SUMMARY
Course Elapsed Days: 29
Plan Fractions Treated to Date: 22
Plan Prescribed Dose Per Fraction: 1.8 Gy
Plan Total Fractions Prescribed: 25
Plan Total Prescribed Dose: 45 Gy
Reference Point Dosage Given to Date: 39.6 Gy
Reference Point Session Dosage Given: 1.8 Gy
Session Number: 22

## 2021-06-03 ENCOUNTER — Other Ambulatory Visit: Payer: Self-pay

## 2021-06-03 ENCOUNTER — Ambulatory Visit
Admission: RE | Admit: 2021-06-03 | Discharge: 2021-06-03 | Disposition: A | Discharge status: Home / Self Care | Payer: Medicare Other | Source: Ambulatory Visit | Attending: Radiation Oncology | Admitting: Radiation Oncology

## 2021-06-03 DIAGNOSIS — Z510 Encounter for antineoplastic radiation therapy: Secondary | ICD-10-CM | POA: Diagnosis not present

## 2021-06-03 LAB — RAD ONC ARIA SESSION SUMMARY
Course Elapsed Days: 30
Plan Fractions Treated to Date: 23
Plan Prescribed Dose Per Fraction: 1.8 Gy
Plan Total Fractions Prescribed: 25
Plan Total Prescribed Dose: 45 Gy
Reference Point Dosage Given to Date: 41.4 Gy
Reference Point Session Dosage Given: 1.8 Gy
Session Number: 23

## 2021-06-06 ENCOUNTER — Other Ambulatory Visit: Payer: Self-pay

## 2021-06-06 ENCOUNTER — Encounter: Payer: Self-pay | Admitting: Hematology and Oncology

## 2021-06-06 ENCOUNTER — Ambulatory Visit
Admission: RE | Admit: 2021-06-06 | Discharge: 2021-06-06 | Disposition: A | Discharge status: Home / Self Care | Payer: Medicare Other | Source: Ambulatory Visit | Attending: Radiation Oncology | Admitting: Radiation Oncology

## 2021-06-06 ENCOUNTER — Ambulatory Visit: Payer: Medicare Other

## 2021-06-06 ENCOUNTER — Inpatient Hospital Stay: Payer: Medicare Other

## 2021-06-06 ENCOUNTER — Inpatient Hospital Stay (HOSPITAL_BASED_OUTPATIENT_CLINIC_OR_DEPARTMENT_OTHER): Payer: Medicare Other | Admitting: Hematology and Oncology

## 2021-06-06 VITALS — BP 132/57 | HR 72 | Temp 98.1°F | Resp 18 | Ht 67.0 in | Wt 202.5 lb

## 2021-06-06 DIAGNOSIS — N183 Chronic kidney disease, stage 3 unspecified: Secondary | ICD-10-CM | POA: Insufficient documentation

## 2021-06-06 DIAGNOSIS — C61 Malignant neoplasm of prostate: Secondary | ICD-10-CM | POA: Insufficient documentation

## 2021-06-06 DIAGNOSIS — E1122 Type 2 diabetes mellitus with diabetic chronic kidney disease: Secondary | ICD-10-CM | POA: Insufficient documentation

## 2021-06-06 DIAGNOSIS — I5022 Chronic systolic (congestive) heart failure: Secondary | ICD-10-CM | POA: Insufficient documentation

## 2021-06-06 DIAGNOSIS — D696 Thrombocytopenia, unspecified: Secondary | ICD-10-CM

## 2021-06-06 DIAGNOSIS — D751 Secondary polycythemia: Secondary | ICD-10-CM | POA: Insufficient documentation

## 2021-06-06 DIAGNOSIS — D649 Anemia, unspecified: Secondary | ICD-10-CM | POA: Insufficient documentation

## 2021-06-06 DIAGNOSIS — Z510 Encounter for antineoplastic radiation therapy: Secondary | ICD-10-CM | POA: Diagnosis not present

## 2021-06-06 DIAGNOSIS — Z808 Family history of malignant neoplasm of other organs or systems: Secondary | ICD-10-CM | POA: Insufficient documentation

## 2021-06-06 DIAGNOSIS — I130 Hypertensive heart and chronic kidney disease with heart failure and stage 1 through stage 4 chronic kidney disease, or unspecified chronic kidney disease: Secondary | ICD-10-CM | POA: Insufficient documentation

## 2021-06-06 LAB — CBC WITH DIFFERENTIAL (CANCER CENTER ONLY)
Abs Immature Granulocytes: 0.04 10*3/uL (ref 0.00–0.07)
Basophils Absolute: 0 10*3/uL (ref 0.0–0.1)
Basophils Relative: 1 %
Eosinophils Absolute: 0.1 10*3/uL (ref 0.0–0.5)
Eosinophils Relative: 5 %
HCT: 33.4 % — ABNORMAL LOW (ref 39.0–52.0)
Hemoglobin: 11.3 g/dL — ABNORMAL LOW (ref 13.0–17.0)
Immature Granulocytes: 1 %
Lymphocytes Relative: 10 %
Lymphs Abs: 0.3 10*3/uL — ABNORMAL LOW (ref 0.7–4.0)
MCH: 29.4 pg (ref 26.0–34.0)
MCHC: 33.8 g/dL (ref 30.0–36.0)
MCV: 87 fL (ref 80.0–100.0)
Monocytes Absolute: 0.5 10*3/uL (ref 0.1–1.0)
Monocytes Relative: 16 %
Neutro Abs: 1.9 10*3/uL (ref 1.7–7.7)
Neutrophils Relative %: 67 %
Platelet Count: 117 10*3/uL — ABNORMAL LOW (ref 150–400)
RBC: 3.84 MIL/uL — ABNORMAL LOW (ref 4.22–5.81)
RDW: 14 % (ref 11.5–15.5)
WBC Count: 2.8 10*3/uL — ABNORMAL LOW (ref 4.0–10.5)
nRBC: 0 % (ref 0.0–0.2)

## 2021-06-06 LAB — RAD ONC ARIA SESSION SUMMARY
Course Elapsed Days: 33
Plan Fractions Treated to Date: 24
Plan Prescribed Dose Per Fraction: 1.8 Gy
Plan Total Fractions Prescribed: 25
Plan Total Prescribed Dose: 45 Gy
Reference Point Dosage Given to Date: 43.2 Gy
Reference Point Session Dosage Given: 1.8 Gy
Session Number: 24

## 2021-06-06 LAB — CMP (CANCER CENTER ONLY)
ALT: 15 U/L (ref 0–44)
AST: 15 U/L (ref 15–41)
Albumin: 3.8 g/dL (ref 3.5–5.0)
Alkaline Phosphatase: 50 U/L (ref 38–126)
Anion gap: 8 (ref 5–15)
BUN: 23 mg/dL (ref 8–23)
CO2: 27 mmol/L (ref 22–32)
Calcium: 9.2 mg/dL (ref 8.9–10.3)
Chloride: 108 mmol/L (ref 98–111)
Creatinine: 1.2 mg/dL (ref 0.61–1.24)
GFR, Estimated: >60 mL/min (ref >60)
Glucose, Bld: 142 mg/dL — ABNORMAL HIGH (ref 70–99)
Potassium: 4 mmol/L (ref 3.5–5.1)
Sodium: 143 mmol/L (ref 135–145)
Total Bilirubin: 0.6 mg/dL (ref 0.3–1.2)
Total Protein: 7 g/dL (ref 6.5–8.1)
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
## 2021-06-07 ENCOUNTER — Ambulatory Visit: Payer: Medicare Other

## 2021-06-07 ENCOUNTER — Ambulatory Visit
Admission: RE | Admit: 2021-06-07 | Discharge: 2021-06-07 | Disposition: A | Discharge status: Home / Self Care | Payer: Medicare Other | Source: Ambulatory Visit | Attending: Radiation Oncology | Admitting: Radiation Oncology

## 2021-06-07 ENCOUNTER — Other Ambulatory Visit: Payer: Self-pay

## 2021-06-07 DIAGNOSIS — Z510 Encounter for antineoplastic radiation therapy: Secondary | ICD-10-CM | POA: Diagnosis not present

## 2021-06-07 LAB — RAD ONC ARIA SESSION SUMMARY
Course Elapsed Days: 34
Plan Fractions Treated to Date: 25
Plan Prescribed Dose Per Fraction: 1.8 Gy
Plan Total Fractions Prescribed: 25
Plan Total Prescribed Dose: 45 Gy
Reference Point Dosage Given to Date: 45 Gy
Reference Point Session Dosage Given: 1.8 Gy
Session Number: 25

## 2021-06-07 NOTE — Progress Notes (Signed)
Remote ICD transmission.   

## 2021-06-08 ENCOUNTER — Other Ambulatory Visit: Payer: Self-pay

## 2021-06-08 ENCOUNTER — Ambulatory Visit
Admission: RE | Admit: 2021-06-08 | Discharge: 2021-06-08 | Disposition: A | Discharge status: Home / Self Care | Payer: Medicare Other | Source: Ambulatory Visit | Attending: Radiation Oncology | Admitting: Radiation Oncology

## 2021-06-08 ENCOUNTER — Ambulatory Visit: Payer: Medicare Other

## 2021-06-08 DIAGNOSIS — Z510 Encounter for antineoplastic radiation therapy: Secondary | ICD-10-CM | POA: Diagnosis not present

## 2021-06-08 LAB — RAD ONC ARIA SESSION SUMMARY
Course Elapsed Days: 35
Plan Fractions Treated to Date: 1
Plan Prescribed Dose Per Fraction: 2 Gy
Plan Total Fractions Prescribed: 15
Plan Total Prescribed Dose: 30 Gy
Reference Point Dosage Given to Date: 47 Gy
Reference Point Session Dosage Given: 2 Gy
Session Number: 26

## 2021-06-09 ENCOUNTER — Ambulatory Visit
Admission: RE | Admit: 2021-06-09 | Discharge: 2021-06-09 | Disposition: A | Discharge status: Home / Self Care | Payer: Medicare Other | Source: Ambulatory Visit | Attending: Radiation Oncology | Admitting: Radiation Oncology

## 2021-06-09 ENCOUNTER — Other Ambulatory Visit: Payer: Self-pay

## 2021-06-09 DIAGNOSIS — Z510 Encounter for antineoplastic radiation therapy: Secondary | ICD-10-CM | POA: Diagnosis not present

## 2021-06-09 LAB — RAD ONC ARIA SESSION SUMMARY
Course Elapsed Days: 36
Plan Fractions Treated to Date: 2
Plan Prescribed Dose Per Fraction: 2 Gy
Plan Total Fractions Prescribed: 15
Plan Total Prescribed Dose: 30 Gy
Reference Point Dosage Given to Date: 49 Gy
Reference Point Session Dosage Given: 2 Gy
Session Number: 27

## 2021-06-10 ENCOUNTER — Ambulatory Visit
Admission: RE | Admit: 2021-06-10 | Discharge: 2021-06-10 | Disposition: A | Discharge status: Home / Self Care | Payer: Medicare Other | Source: Ambulatory Visit | Attending: Radiation Oncology | Admitting: Radiation Oncology

## 2021-06-10 ENCOUNTER — Other Ambulatory Visit: Payer: Self-pay

## 2021-06-10 DIAGNOSIS — Z510 Encounter for antineoplastic radiation therapy: Secondary | ICD-10-CM | POA: Diagnosis not present

## 2021-06-10 LAB — RAD ONC ARIA SESSION SUMMARY
Course Elapsed Days: 37
Plan Fractions Treated to Date: 3
Plan Prescribed Dose Per Fraction: 2 Gy
Plan Total Fractions Prescribed: 15
Plan Total Prescribed Dose: 30 Gy
Reference Point Dosage Given to Date: 51 Gy
Reference Point Session Dosage Given: 2 Gy
Session Number: 28

## 2021-06-12 ENCOUNTER — Encounter: Payer: Self-pay | Admitting: Hematology and Oncology

## 2021-06-13 ENCOUNTER — Ambulatory Visit
Admission: RE | Admit: 2021-06-13 | Discharge: 2021-06-13 | Disposition: A | Discharge status: Home / Self Care | Payer: Medicare Other | Source: Ambulatory Visit | Attending: Radiation Oncology | Admitting: Radiation Oncology

## 2021-06-13 ENCOUNTER — Other Ambulatory Visit: Payer: Self-pay

## 2021-06-13 DIAGNOSIS — Z510 Encounter for antineoplastic radiation therapy: Secondary | ICD-10-CM | POA: Insufficient documentation

## 2021-06-13 DIAGNOSIS — C61 Malignant neoplasm of prostate: Secondary | ICD-10-CM | POA: Insufficient documentation

## 2021-06-13 LAB — RAD ONC ARIA SESSION SUMMARY
Course Elapsed Days: 40
Plan Fractions Treated to Date: 4
Plan Prescribed Dose Per Fraction: 2 Gy
Plan Total Fractions Prescribed: 15
Plan Total Prescribed Dose: 30 Gy
Reference Point Dosage Given to Date: 53 Gy
Reference Point Session Dosage Given: 2 Gy
Session Number: 29

## 2021-06-14 ENCOUNTER — Ambulatory Visit: Payer: Medicare Other

## 2021-06-14 ENCOUNTER — Other Ambulatory Visit: Payer: Self-pay

## 2021-06-14 ENCOUNTER — Ambulatory Visit
Admission: RE | Admit: 2021-06-14 | Discharge: 2021-06-14 | Disposition: A | Discharge status: Home / Self Care | Payer: Medicare Other | Source: Ambulatory Visit | Attending: Radiation Oncology | Admitting: Radiation Oncology

## 2021-06-14 DIAGNOSIS — Z510 Encounter for antineoplastic radiation therapy: Secondary | ICD-10-CM | POA: Diagnosis not present

## 2021-06-14 LAB — RAD ONC ARIA SESSION SUMMARY
Course Elapsed Days: 41
Plan Fractions Treated to Date: 5
Plan Prescribed Dose Per Fraction: 2 Gy
Plan Total Fractions Prescribed: 15
Plan Total Prescribed Dose: 30 Gy
Reference Point Dosage Given to Date: 55 Gy
Reference Point Session Dosage Given: 2 Gy
Session Number: 30

## 2021-06-15 ENCOUNTER — Other Ambulatory Visit: Payer: Self-pay

## 2021-06-15 ENCOUNTER — Ambulatory Visit
Admission: RE | Admit: 2021-06-15 | Discharge: 2021-06-15 | Disposition: A | Discharge status: Home / Self Care | Payer: Medicare Other | Source: Ambulatory Visit | Attending: Radiation Oncology | Admitting: Radiation Oncology

## 2021-06-15 DIAGNOSIS — Z510 Encounter for antineoplastic radiation therapy: Secondary | ICD-10-CM | POA: Diagnosis not present

## 2021-06-15 LAB — RAD ONC ARIA SESSION SUMMARY
Course Elapsed Days: 42
Plan Fractions Treated to Date: 6
Plan Prescribed Dose Per Fraction: 2 Gy
Plan Total Fractions Prescribed: 15
Plan Total Prescribed Dose: 30 Gy
Reference Point Dosage Given to Date: 57 Gy
Reference Point Session Dosage Given: 2 Gy
Session Number: 31

## 2021-06-16 ENCOUNTER — Other Ambulatory Visit: Payer: Self-pay

## 2021-06-16 ENCOUNTER — Ambulatory Visit
Admission: RE | Admit: 2021-06-16 | Discharge: 2021-06-16 | Disposition: A | Discharge status: Home / Self Care | Payer: Medicare Other | Source: Ambulatory Visit | Attending: Radiation Oncology | Admitting: Radiation Oncology

## 2021-06-16 DIAGNOSIS — Z510 Encounter for antineoplastic radiation therapy: Secondary | ICD-10-CM | POA: Diagnosis not present

## 2021-06-16 LAB — RAD ONC ARIA SESSION SUMMARY
Course Elapsed Days: 43
Plan Fractions Treated to Date: 7
Plan Prescribed Dose Per Fraction: 2 Gy
Plan Total Fractions Prescribed: 15
Plan Total Prescribed Dose: 30 Gy
Reference Point Dosage Given to Date: 59 Gy
Reference Point Session Dosage Given: 2 Gy
Session Number: 32

## 2021-06-17 ENCOUNTER — Ambulatory Visit
Admission: RE | Admit: 2021-06-17 | Discharge: 2021-06-17 | Disposition: A | Discharge status: Home / Self Care | Payer: Medicare Other | Source: Ambulatory Visit | Attending: Radiation Oncology | Admitting: Radiation Oncology

## 2021-06-17 ENCOUNTER — Other Ambulatory Visit: Payer: Self-pay

## 2021-06-17 DIAGNOSIS — Z510 Encounter for antineoplastic radiation therapy: Secondary | ICD-10-CM | POA: Diagnosis not present

## 2021-06-17 LAB — RAD ONC ARIA SESSION SUMMARY
Course Elapsed Days: 44
Plan Fractions Treated to Date: 8
Plan Prescribed Dose Per Fraction: 2 Gy
Plan Total Fractions Prescribed: 15
Plan Total Prescribed Dose: 30 Gy
Reference Point Dosage Given to Date: 61 Gy
Reference Point Session Dosage Given: 2 Gy
Session Number: 33

## 2021-06-20 ENCOUNTER — Other Ambulatory Visit: Payer: Self-pay

## 2021-06-20 ENCOUNTER — Ambulatory Visit
Admission: RE | Admit: 2021-06-20 | Discharge: 2021-06-20 | Disposition: A | Discharge status: Home / Self Care | Payer: Medicare Other | Source: Ambulatory Visit | Attending: Radiation Oncology | Admitting: Radiation Oncology

## 2021-06-20 DIAGNOSIS — Z510 Encounter for antineoplastic radiation therapy: Secondary | ICD-10-CM | POA: Diagnosis not present

## 2021-06-20 LAB — RAD ONC ARIA SESSION SUMMARY
Course Elapsed Days: 47
Plan Fractions Treated to Date: 9
Plan Prescribed Dose Per Fraction: 2 Gy
Plan Total Fractions Prescribed: 15
Plan Total Prescribed Dose: 30 Gy
Reference Point Dosage Given to Date: 63 Gy
Reference Point Session Dosage Given: 2 Gy
Session Number: 34

## 2021-06-21 ENCOUNTER — Ambulatory Visit
Admission: RE | Admit: 2021-06-21 | Discharge: 2021-06-21 | Disposition: A | Discharge status: Home / Self Care | Payer: Medicare Other | Source: Ambulatory Visit | Attending: Radiation Oncology | Admitting: Radiation Oncology

## 2021-06-21 ENCOUNTER — Other Ambulatory Visit: Payer: Self-pay

## 2021-06-21 DIAGNOSIS — Z510 Encounter for antineoplastic radiation therapy: Secondary | ICD-10-CM | POA: Diagnosis not present

## 2021-06-21 LAB — RAD ONC ARIA SESSION SUMMARY
Course Elapsed Days: 48
Plan Fractions Treated to Date: 10
Plan Prescribed Dose Per Fraction: 2 Gy
Plan Total Fractions Prescribed: 15
Plan Total Prescribed Dose: 30 Gy
Reference Point Dosage Given to Date: 65 Gy
Reference Point Session Dosage Given: 2 Gy
Session Number: 35

## 2021-06-22 ENCOUNTER — Other Ambulatory Visit: Payer: Self-pay

## 2021-06-22 ENCOUNTER — Ambulatory Visit
Admission: RE | Admit: 2021-06-22 | Discharge: 2021-06-22 | Disposition: A | Discharge status: Home / Self Care | Payer: Medicare Other | Source: Ambulatory Visit | Attending: Radiation Oncology | Admitting: Radiation Oncology

## 2021-06-22 DIAGNOSIS — Z510 Encounter for antineoplastic radiation therapy: Secondary | ICD-10-CM | POA: Diagnosis not present

## 2021-06-22 LAB — RAD ONC ARIA SESSION SUMMARY
Course Elapsed Days: 49
Plan Fractions Treated to Date: 11
Plan Prescribed Dose Per Fraction: 2 Gy
Plan Total Fractions Prescribed: 15
Plan Total Prescribed Dose: 30 Gy
Reference Point Dosage Given to Date: 67 Gy
Reference Point Session Dosage Given: 2 Gy
Session Number: 36

## 2021-06-23 ENCOUNTER — Other Ambulatory Visit: Payer: Self-pay

## 2021-06-23 ENCOUNTER — Ambulatory Visit
Admission: RE | Admit: 2021-06-23 | Discharge: 2021-06-23 | Disposition: A | Discharge status: Home / Self Care | Payer: Medicare Other | Source: Ambulatory Visit | Attending: Radiation Oncology | Admitting: Radiation Oncology

## 2021-06-23 DIAGNOSIS — Z510 Encounter for antineoplastic radiation therapy: Secondary | ICD-10-CM | POA: Diagnosis not present

## 2021-06-23 LAB — RAD ONC ARIA SESSION SUMMARY
Course Elapsed Days: 50
Plan Fractions Treated to Date: 12
Plan Prescribed Dose Per Fraction: 2 Gy
Plan Total Fractions Prescribed: 15
Plan Total Prescribed Dose: 30 Gy
Reference Point Dosage Given to Date: 69 Gy
Reference Point Session Dosage Given: 2 Gy
Session Number: 37

## 2021-06-24 ENCOUNTER — Other Ambulatory Visit: Payer: Self-pay

## 2021-06-24 ENCOUNTER — Ambulatory Visit: Payer: Medicare Other

## 2021-06-24 ENCOUNTER — Ambulatory Visit
Admission: RE | Admit: 2021-06-24 | Discharge: 2021-06-24 | Disposition: A | Discharge status: Home / Self Care | Payer: Medicare Other | Source: Ambulatory Visit | Attending: Radiation Oncology | Admitting: Radiation Oncology

## 2021-06-24 DIAGNOSIS — Z510 Encounter for antineoplastic radiation therapy: Secondary | ICD-10-CM | POA: Diagnosis not present

## 2021-06-24 LAB — RAD ONC ARIA SESSION SUMMARY
Course Elapsed Days: 51
Plan Fractions Treated to Date: 13
Plan Prescribed Dose Per Fraction: 2 Gy
Plan Total Fractions Prescribed: 15
Plan Total Prescribed Dose: 30 Gy
Reference Point Dosage Given to Date: 71 Gy
Reference Point Session Dosage Given: 2 Gy
Session Number: 38

## 2021-06-27 ENCOUNTER — Ambulatory Visit: Payer: Medicare Other

## 2021-06-27 ENCOUNTER — Other Ambulatory Visit: Payer: Self-pay

## 2021-06-27 DIAGNOSIS — Z510 Encounter for antineoplastic radiation therapy: Secondary | ICD-10-CM | POA: Diagnosis not present

## 2021-06-27 LAB — RAD ONC ARIA SESSION SUMMARY
Course Elapsed Days: 54
Plan Fractions Treated to Date: 14
Plan Prescribed Dose Per Fraction: 2 Gy
Plan Total Fractions Prescribed: 15
Plan Total Prescribed Dose: 30 Gy
Reference Point Dosage Given to Date: 73 Gy
Reference Point Session Dosage Given: 2 Gy
Session Number: 39

## 2021-06-28 ENCOUNTER — Encounter: Payer: Self-pay | Admitting: Urology

## 2021-06-28 ENCOUNTER — Ambulatory Visit: Payer: Medicare Other

## 2021-06-28 ENCOUNTER — Other Ambulatory Visit: Payer: Self-pay

## 2021-06-28 DIAGNOSIS — Z510 Encounter for antineoplastic radiation therapy: Secondary | ICD-10-CM | POA: Diagnosis not present

## 2021-06-28 DIAGNOSIS — C61 Malignant neoplasm of prostate: Secondary | ICD-10-CM

## 2021-06-28 LAB — RAD ONC ARIA SESSION SUMMARY
Course Elapsed Days: 55
Plan Fractions Treated to Date: 15
Plan Prescribed Dose Per Fraction: 2 Gy
Plan Total Fractions Prescribed: 15
Plan Total Prescribed Dose: 30 Gy
Reference Point Dosage Given to Date: 75 Gy
Reference Point Session Dosage Given: 2 Gy
Session Number: 40

## 2021-07-13 ENCOUNTER — Encounter: Payer: Self-pay | Admitting: Podiatry

## 2021-07-13 ENCOUNTER — Ambulatory Visit (INDEPENDENT_AMBULATORY_CARE_PROVIDER_SITE_OTHER): Payer: Medicare Other | Admitting: Podiatry

## 2021-07-13 DIAGNOSIS — M79674 Pain in right toe(s): Secondary | ICD-10-CM

## 2021-07-13 DIAGNOSIS — E1159 Type 2 diabetes mellitus with other circulatory complications: Secondary | ICD-10-CM

## 2021-07-13 DIAGNOSIS — M79675 Pain in left toe(s): Secondary | ICD-10-CM | POA: Diagnosis not present

## 2021-07-13 DIAGNOSIS — B351 Tinea unguium: Secondary | ICD-10-CM

## 2021-07-13 NOTE — Progress Notes (Signed)
This patient returns to my office for at risk foot care.  This patient requires this care by a professional since this patient will be at risk due to having diabetes and thrombocytopenia.  This patient is unable to cut nails himself since the patient cannot reach his nails.These nails are painful walking and wearing shoes.  This patient presents for at risk foot care today.  General Appearance  Alert, conversant and in no acute stress.  Vascular  Dorsalis pedis and posterior tibial  pulses are palpable  bilaterally.  Capillary return is within normal limits  bilaterally. Temperature is within normal limits  bilaterally.  Neurologic  Senn-Weinstein monofilament wire test within normal limits  bilaterally. Muscle power within normal limits bilaterally.  Nails Thick disfigured discolored nails with subungual debris  from hallux to fifth toes bilaterally. No evidence of bacterial infection or drainage bilaterally.  Orthopedic  No limitations of motion  feet .  No crepitus or effusions noted.  No bony pathology or digital deformities noted.  Skin  normotropic skin with no porokeratosis noted bilaterally.  No signs of infections or ulcers noted.     Onychomycosis  Pain in right toes  Pain in left toes  Consent was obtained for treatment procedures.   Mechanical debridement of nails 1-5  bilaterally performed with a nail nipper.  Filed with dremel without incident.    Return office visit    3 months                  Told patient to return for periodic foot care and evaluation due to potential at risk complications.   Carlus Stay DPM   

## 2021-08-03 ENCOUNTER — Encounter: Payer: Self-pay | Admitting: Urology

## 2021-08-03 NOTE — Progress Notes (Signed)
Telephone appointment. I spoke w/ patient, verified identity and began nursing interview. Patient reports groin pain 3/10. No other issuers reported at this time.  Meaningful use complete. I-PSS score of 1-mild No urinary management medications. Urology appointment- July, 2023.  Reminded patient of his 9:30am-08/04/21 telephone appointment w/ Ashlyn Bruning PA-C. I left my extension [phone] in case patient needs anything. Patient verbalized understanding.  Patient contact [phone]

## 2021-08-04 ENCOUNTER — Ambulatory Visit
Admission: RE | Admit: 2021-08-04 | Discharge: 2021-08-04 | Disposition: A | Discharge status: Home / Self Care | Payer: Medicare Other | Source: Ambulatory Visit | Attending: Urology | Admitting: Urology

## 2021-08-04 DIAGNOSIS — C61 Malignant neoplasm of prostate: Secondary | ICD-10-CM

## 2021-08-04 NOTE — Progress Notes (Signed)
  Radiation Oncology         [phone] ________________________________  Name: Kenneth Giles MRN: [id number]  Date: 06/28/2021  DOB: 23-May-1950  End of Treatment Note  Diagnosis:   71 y.o. gentleman with Stage T2a adenocarcinoma of the prostate with Gleason score of 4+5, and PSA of 7.84.     Indication for treatment:  Curative, Definitive Radiotherapy; concurrent with LT-ADT      Radiation treatment dates:   05/04/21 - 06/28/21  Site/dose:  1. The prostate, seminal vesicles, and pelvic lymph nodes were initially treated to 45 Gy in 25 fractions of 1.8 Gy  2. The prostate only was boosted to 75 Gy with 15 additional fractions of 2.0 Gy   Beams/energy:  1. The prostate, seminal vesicles, and pelvic lymph nodes were initially treated using VMAT intensity modulated radiotherapy delivering 6 megavolt photons. Image guidance was performed with CB-CT studies prior to each fraction. He was immobilized with a body fix lower extremity mold.  2. the prostate only was boosted using VMAT intensity modulated radiotherapy delivering 6 megavolt photons. Image guidance was performed with CB-CT studies prior to each fraction. He was immobilized with a body fix lower extremity mold.  Narrative: The patient tolerated radiation treatment relatively well with only minor urinary irritation and modest fatigue.  He did report increased frequency, urgency, nocturia and mild dysuria intermittently.  He denied any abdominal pain, nausea, vomiting, diarrhea or constipation.  Plan: The patient has completed radiation treatment. He will return to radiation oncology clinic for routine followup in one month. I advised him to call or return sooner if he has any questions or concerns related to his recovery or treatment. ________________________________  Sheral Apley. Tammi Klippel, M.D.

## 2021-08-04 NOTE — Progress Notes (Signed)
Radiation Oncology         [phone] ________________________________  Name: Kenneth Giles MRN: [id number]  Date: 08/04/2021  DOB: 03/13/1950  Post Treatment Note  CC: Lajean Manes, MD  Irine Seal, MD  Diagnosis:   71 y.o. gentleman with Stage T2a adenocarcinoma of the prostate with Gleason score of 4+5, and PSA of 7.84.  Interval Since Last Radiation:  5 weeks   05/04/21 - 06/28/21: concurrent with LT-ADT 1. The prostate, seminal vesicles, and pelvic lymph nodes were initially treated to 45 Gy in 25 fractions of 1.8 Gy  2. The prostate only was boosted to 75 Gy with 15 additional fractions of 2.0 Gy    Narrative:  I spoke with the patient to conduct his routine scheduled 1 month follow up visit via telephone to spare the patient unnecessary potential exposure in the healthcare setting during the current COVID-19 pandemic.  The patient was notified in advance and gave permission to proceed with this visit format.  He tolerated radiation treatment relatively well with only minor urinary irritation and modest fatigue.  He did report increased frequency, urgency, nocturia and mild dysuria intermittently.  He denied any abdominal pain, nausea, vomiting, diarrhea or constipation                              On review of systems, the patient states that he is doing well in general.  He continues with some hesitancy and a weaker flow of stream intermittently.  At the time of his recent follow-up visit with Dr. Jeffie Pollock on 07/25/2021, he was prescribed Flomax but has not yet started taking this.  His PSA prior to that visit was 0.015.  He specifically denies gross hematuria, dysuria, incomplete bladder emptying or incontinence.  He reports a healthy appetite and is maintaining his weight.  He denies any abdominal pain, nausea, vomiting, diarrhea or constipation.  He is continued with monthly Firmagon injections for ADT, with his next injection scheduled for 08/12/2021.  He feels like he is tolerating  the ADT well, despite some decreased stamina but overall, he is quite pleased with his progress to date.  ALLERGIES:  is allergic to metformin hcl, procaine, rosuvastatin, spironolactone, and tylenol with codeine #3 [acetaminophen-codeine].  Meds: Current Outpatient Medications  Medication Sig Dispense Refill   aspirin EC 81 MG tablet Take 1 tablet (81 mg total) by mouth daily. 30 tablet 11   atorvastatin (LIPITOR) 40 MG tablet Take 40 mg by mouth daily at 6 PM.     B Complex CAPS Take 1 capsule by mouth daily.     calcium carbonate (OSCAL) 1500 (600 Ca) MG TABS tablet Take 600 mg by mouth 2 (two) times daily.     carvedilol (COREG) 25 MG tablet TAKE 1 TABLET BY MOUTH TWICE A DAY 180 tablet 3   cetirizine (ZYRTEC) 10 MG tablet Take 10 mg by mouth at bedtime.     cyclobenzaprine (FLEXERIL) 5 MG tablet Take 5 mg by mouth at bedtime as needed.     empagliflozin (JARDIANCE) 25 MG TABS tablet Take 1 tablet by mouth daily.     famotidine (PEPCID) 40 MG tablet Take 1 tablet by mouth at bedtime.     fenofibrate micronized (LOFIBRA) 200 MG capsule Take 200 mg by mouth daily before breakfast.     fluticasone (FLONASE) 50 MCG/ACT nasal spray 2 sprays at bedtime.     glimepiride (AMARYL) 1 MG tablet Take 1 mg  by mouth at bedtime.     lisinopril (ZESTRIL) 40 MG tablet TAKE 1 TABLET BY MOUTH EVERY DAY 90 tablet 3   Methylcellulose, Laxative, (CITRUCEL PO) Take 500 mg daily as needed by mouth (constipation).      Misc Natural Products (GLUCOSAMINE-CHONDROITIN PLUS) TABS Take 1 tablet by mouth 2 (two) times daily.     Misc Natural Products (LUTEIN 20) CAPS Take 20 mg by mouth every evening.     Multiple Vitamins-Minerals (ONE-A-DAY MENS 50+) TABS Take by mouth daily.     naproxen sodium (ALEVE) 220 MG tablet Take 220 mg by mouth daily as needed (pain).     nitroGLYCERIN (NITROSTAT) 0.4 MG SL tablet Place 1 tablet (0.4 mg total) under the tongue every 5 (five) minutes as needed for chest pain. 25 tablet 3    OMEGA 3 1000 MG CAPS Take 1 capsule by mouth daily.     oxyCODONE (OXY IR/ROXICODONE) 5 MG immediate release tablet Take 5 mg by mouth every 6 (six) hours as needed.     potassium chloride SA (K-DUR,KLOR-CON) 20 MEQ tablet Take 20 mEq by mouth 2 (two) times daily.      sodium chloride (OCEAN) 0.65 % SOLN nasal spray Place 2 sprays into both nostrils in the morning and at bedtime.  0   torsemide (DEMADEX) 10 MG tablet TAKE 1 TABLET BY MOUTH EVERY DAY 90 tablet 3   vitamin C (ASCORBIC ACID) 500 MG tablet Take 500 mg 2 (two) times daily by mouth.      No current facility-administered medications for this encounter.    Physical Findings:  vitals were not taken for this visit.  Pain Assessment Pain Score: 3  (Groin pain)/10 Unable to assess due to telephone follow-up visit format. Lab Findings: Lab Results  Component Value Date   WBC 2.8 (L) 06/06/2021   HGB 11.3 (L) 06/06/2021   HCT 33.4 (L) 06/06/2021   MCV 87.0 06/06/2021   PLT 117 (L) 06/06/2021     Radiographic Findings: No results found.  Impression/Plan: 1. 71 y.o. gentleman with Stage T2a adenocarcinoma of the prostate with Gleason score of 4+5, and PSA of 7.84. He will continue to follow up with urology for ongoing PSA determinations and had a recent follow-up appointment with Dr. Jeffie Pollock on 07/25/2021. He understands what to expect with regards to PSA monitoring going forward and is encouraged with his progress already, with his most recent PSA decreased to 0.015. I will look forward to following his response to treatment via correspondence with urology, and would be happy to continue to participate in his care if clinically indicated. I talked to the patient about what to expect in the future, including his risk for erectile dysfunction and rectal bleeding.  He is planning to start taking the Flomax beginning this evening.  I encouraged him to call or return to the office if he has any questions regarding his previous radiation or  possible radiation side effects. He was comfortable with this plan and will follow up as needed.     Nicholos Johns, PA-C

## 2021-08-22 ENCOUNTER — Ambulatory Visit (INDEPENDENT_AMBULATORY_CARE_PROVIDER_SITE_OTHER): Payer: Medicare Other

## 2021-08-22 DIAGNOSIS — I5022 Chronic systolic (congestive) heart failure: Secondary | ICD-10-CM

## 2021-08-22 DIAGNOSIS — I255 Ischemic cardiomyopathy: Secondary | ICD-10-CM | POA: Diagnosis not present

## 2021-08-22 LAB — CUP PACEART REMOTE DEVICE CHECK
Battery Remaining Longevity: 40 mo
Battery Remaining Percentage: 44 %
Battery Voltage: 2.93 V
Brady Statistic AP VP Percent: 39 %
Brady Statistic AP VS Percent: 1 %
Brady Statistic AS VP Percent: 61 %
Brady Statistic AS VS Percent: 1 %
Brady Statistic RA Percent Paced: 39 %
Date Time Interrogation Session: 20230710020026
HighPow Impedance: 73 Ohm
HighPow Impedance: 73 Ohm
Implantable Lead Implant Date: 20181115
Implantable Lead Implant Date: 20181115
Implantable Lead Implant Date: 20181115
Implantable Lead Location: 753858
Implantable Lead Location: 753859
Implantable Lead Location: 753860
Implantable Lead Model: 7122
Implantable Pulse Generator Implant Date: 20181115
Lead Channel Impedance Value: 410 Ohm
Lead Channel Impedance Value: 460 Ohm
Lead Channel Impedance Value: 640 Ohm
Lead Channel Pacing Threshold Amplitude: 0.5 V
Lead Channel Pacing Threshold Amplitude: 0.625 V
Lead Channel Pacing Threshold Amplitude: 0.625 V
Lead Channel Pacing Threshold Pulse Width: 0.5 ms
Lead Channel Pacing Threshold Pulse Width: 0.5 ms
Lead Channel Pacing Threshold Pulse Width: 0.5 ms
Lead Channel Sensing Intrinsic Amplitude: 11.7 mV
Lead Channel Sensing Intrinsic Amplitude: 3.9 mV
Lead Channel Setting Pacing Amplitude: 1.5 V
Lead Channel Setting Pacing Amplitude: 2 V
Lead Channel Setting Pacing Amplitude: 2 V
Lead Channel Setting Pacing Pulse Width: 0.5 ms
Lead Channel Setting Pacing Pulse Width: 0.5 ms
Lead Channel Setting Sensing Sensitivity: 0.5 mV
Pulse Gen Serial Number: 9780956

## 2021-09-20 NOTE — Progress Notes (Signed)
Remote ICD transmission.   

## 2021-10-19 ENCOUNTER — Ambulatory Visit: Payer: Medicare Other | Admitting: Podiatry

## 2021-10-31 ENCOUNTER — Encounter: Payer: Self-pay | Admitting: Podiatry

## 2021-10-31 ENCOUNTER — Ambulatory Visit (INDEPENDENT_AMBULATORY_CARE_PROVIDER_SITE_OTHER): Payer: Medicare Other | Admitting: Podiatry

## 2021-10-31 DIAGNOSIS — E1159 Type 2 diabetes mellitus with other circulatory complications: Secondary | ICD-10-CM

## 2021-10-31 DIAGNOSIS — B351 Tinea unguium: Secondary | ICD-10-CM | POA: Diagnosis not present

## 2021-10-31 DIAGNOSIS — M79674 Pain in right toe(s): Secondary | ICD-10-CM

## 2021-10-31 DIAGNOSIS — M79675 Pain in left toe(s): Secondary | ICD-10-CM | POA: Diagnosis not present

## 2021-10-31 NOTE — Progress Notes (Signed)
This patient returns to my office for at risk foot care.  This patient requires this care by a professional since this patient will be at risk due to having diabetes and thrombocytopenia.  This patient is unable to cut nails himself since the patient cannot reach his nails.These nails are painful walking and wearing shoes.  This patient presents for at risk foot care today.  General Appearance  Alert, conversant and in no acute stress.  Vascular  Dorsalis pedis and posterior tibial  pulses are palpable  bilaterally.  Capillary return is within normal limits  bilaterally. Temperature is within normal limits  bilaterally.  Neurologic  Senn-Weinstein monofilament wire test within normal limits  bilaterally. Muscle power within normal limits bilaterally.  Nails Thick disfigured discolored nails with subungual debris  from hallux to fifth toes bilaterally. No evidence of bacterial infection or drainage bilaterally.  Orthopedic  No limitations of motion  feet .  No crepitus or effusions noted.  No bony pathology or digital deformities noted.  Skin  normotropic skin with no porokeratosis noted bilaterally.  No signs of infections or ulcers noted.     Onychomycosis  Pain in right toes  Pain in left toes  Consent was obtained for treatment procedures.   Mechanical debridement of nails 1-5  bilaterally performed with a nail nipper.  Filed with dremel without incident.    Return office visit    3 months                  Told patient to return for periodic foot care and evaluation due to potential at risk complications.   Gaylin Bulthuis DPM   

## 2021-11-21 ENCOUNTER — Ambulatory Visit (INDEPENDENT_AMBULATORY_CARE_PROVIDER_SITE_OTHER): Payer: Medicare Other

## 2021-11-21 DIAGNOSIS — I255 Ischemic cardiomyopathy: Secondary | ICD-10-CM | POA: Diagnosis not present

## 2021-11-21 DIAGNOSIS — I5022 Chronic systolic (congestive) heart failure: Secondary | ICD-10-CM

## 2021-11-22 LAB — CUP PACEART REMOTE DEVICE CHECK
Battery Remaining Longevity: 37 mo
Battery Remaining Percentage: 41 %
Battery Voltage: 2.93 V
Brady Statistic AP VP Percent: 38 %
Brady Statistic AP VS Percent: 1 %
Brady Statistic AS VP Percent: 62 %
Brady Statistic AS VS Percent: 1 %
Brady Statistic RA Percent Paced: 38 %
Date Time Interrogation Session: 20231009020017
HighPow Impedance: 73 Ohm
HighPow Impedance: 73 Ohm
Implantable Lead Implant Date: 20181115
Implantable Lead Implant Date: 20181115
Implantable Lead Implant Date: 20181115
Implantable Lead Location: 753858
Implantable Lead Location: 753859
Implantable Lead Location: 753860
Implantable Lead Model: 7122
Implantable Pulse Generator Implant Date: 20181115
Lead Channel Impedance Value: 400 Ohm
Lead Channel Impedance Value: 460 Ohm
Lead Channel Impedance Value: 610 Ohm
Lead Channel Pacing Threshold Amplitude: 0.625 V
Lead Channel Pacing Threshold Amplitude: 0.625 V
Lead Channel Pacing Threshold Amplitude: 0.75 V
Lead Channel Pacing Threshold Pulse Width: 0.5 ms
Lead Channel Pacing Threshold Pulse Width: 0.5 ms
Lead Channel Pacing Threshold Pulse Width: 0.5 ms
Lead Channel Sensing Intrinsic Amplitude: 11.7 mV
Lead Channel Sensing Intrinsic Amplitude: 3.7 mV
Lead Channel Setting Pacing Amplitude: 1.625
Lead Channel Setting Pacing Amplitude: 2 V
Lead Channel Setting Pacing Amplitude: 2 V
Lead Channel Setting Pacing Pulse Width: 0.5 ms
Lead Channel Setting Pacing Pulse Width: 0.5 ms
Lead Channel Setting Sensing Sensitivity: 0.5 mV
Pulse Gen Serial Number: 9780956

## 2021-12-05 ENCOUNTER — Other Ambulatory Visit: Payer: Self-pay | Admitting: *Deleted

## 2021-12-05 DIAGNOSIS — D696 Thrombocytopenia, unspecified: Secondary | ICD-10-CM

## 2021-12-05 DIAGNOSIS — D751 Secondary polycythemia: Secondary | ICD-10-CM

## 2021-12-06 ENCOUNTER — Encounter: Payer: Self-pay | Admitting: Hematology and Oncology

## 2021-12-06 ENCOUNTER — Inpatient Hospital Stay (HOSPITAL_BASED_OUTPATIENT_CLINIC_OR_DEPARTMENT_OTHER): Payer: Medicare Other | Admitting: Hematology and Oncology

## 2021-12-06 ENCOUNTER — Inpatient Hospital Stay: Payer: Medicare Other | Attending: Hematology and Oncology

## 2021-12-06 VITALS — BP 142/72 | HR 63 | Temp 97.9°F | Resp 16 | Ht 67.0 in | Wt 211.4 lb

## 2021-12-06 DIAGNOSIS — D72819 Decreased white blood cell count, unspecified: Secondary | ICD-10-CM | POA: Insufficient documentation

## 2021-12-06 DIAGNOSIS — E1122 Type 2 diabetes mellitus with diabetic chronic kidney disease: Secondary | ICD-10-CM | POA: Diagnosis not present

## 2021-12-06 DIAGNOSIS — N183 Chronic kidney disease, stage 3 unspecified: Secondary | ICD-10-CM | POA: Insufficient documentation

## 2021-12-06 DIAGNOSIS — D696 Thrombocytopenia, unspecified: Secondary | ICD-10-CM | POA: Diagnosis not present

## 2021-12-06 DIAGNOSIS — I5022 Chronic systolic (congestive) heart failure: Secondary | ICD-10-CM | POA: Diagnosis not present

## 2021-12-06 DIAGNOSIS — I130 Hypertensive heart and chronic kidney disease with heart failure and stage 1 through stage 4 chronic kidney disease, or unspecified chronic kidney disease: Secondary | ICD-10-CM | POA: Insufficient documentation

## 2021-12-06 DIAGNOSIS — Z8049 Family history of malignant neoplasm of other genital organs: Secondary | ICD-10-CM | POA: Insufficient documentation

## 2021-12-06 DIAGNOSIS — D751 Secondary polycythemia: Secondary | ICD-10-CM | POA: Insufficient documentation

## 2021-12-06 LAB — CBC WITH DIFFERENTIAL (CANCER CENTER ONLY)
Abs Immature Granulocytes: 0.01 10*3/uL (ref 0.00–0.07)
Basophils Absolute: 0 10*3/uL (ref 0.0–0.1)
Basophils Relative: 1 %
Eosinophils Absolute: 0.1 10*3/uL (ref 0.0–0.5)
Eosinophils Relative: 5 %
HCT: 39.9 % (ref 39.0–52.0)
Hemoglobin: 13.5 g/dL (ref 13.0–17.0)
Immature Granulocytes: 0 %
Lymphocytes Relative: 15 %
Lymphs Abs: 0.4 10*3/uL — ABNORMAL LOW (ref 0.7–4.0)
MCH: 28.4 pg (ref 26.0–34.0)
MCHC: 33.8 g/dL (ref 30.0–36.0)
MCV: 83.8 fL (ref 80.0–100.0)
Monocytes Absolute: 0.3 10*3/uL (ref 0.1–1.0)
Monocytes Relative: 11 %
Neutro Abs: 1.7 10*3/uL (ref 1.7–7.7)
Neutrophils Relative %: 68 %
Platelet Count: 82 10*3/uL — ABNORMAL LOW (ref 150–400)
RBC: 4.76 MIL/uL (ref 4.22–5.81)
RDW: 14.3 % (ref 11.5–15.5)
WBC Count: 2.6 10*3/uL — ABNORMAL LOW (ref 4.0–10.5)
nRBC: 0 % (ref 0.0–0.2)

## 2021-12-06 LAB — CMP (CANCER CENTER ONLY)
ALT: 25 U/L (ref 0–44)
AST: 25 U/L (ref 15–41)
Albumin: 3.9 g/dL (ref 3.5–5.0)
Alkaline Phosphatase: 67 U/L (ref 38–126)
Anion gap: 8 (ref 5–15)
BUN: 16 mg/dL (ref 8–23)
CO2: 25 mmol/L (ref 22–32)
Calcium: 9.3 mg/dL (ref 8.9–10.3)
Chloride: 108 mmol/L (ref 98–111)
Creatinine: 1.18 mg/dL (ref 0.61–1.24)
GFR, Estimated: >60 mL/min (ref >60)
Glucose, Bld: 191 mg/dL — ABNORMAL HIGH (ref 70–99)
Potassium: 3.8 mmol/L (ref 3.5–5.1)
Sodium: 141 mmol/L (ref 135–145)
Total Bilirubin: 0.5 mg/dL (ref 0.3–1.2)
Total Protein: 6.7 g/dL (ref 6.5–8.1)

## 2021-12-06 NOTE — Progress Notes (Signed)
[facility] CONSULT NOTE  Patient Care Team: Lajean Manes, MD as PCP - General (Internal Medicine) Belva Crome, MD as PCP - Cardiology (Cardiology) Sueanne Margarita, MD as PCP - Sleep Medicine (Cardiology) Dahlia Byes, MD as Attending Physician (Cardiothoracic Surgery)  CHIEF COMPLAINTS/PURPOSE OF CONSULTATION:  FU about polycythemia and thrombocytopenia.  ASSESSMENT & PLAN:   This is a very pleasant 71 year old male patient with past medical history significant for coronary artery disease status post CABG, hypertension, dyslipidemia referred to hematology for evaluation of polycythemia.  He is here for follow-up.  Since his last visit, he denies any new health complaints.  He is slowly recovering from everything.  Physical examination today without any new concerns.  We have reviewed his labs which showed ongoing leukopenia, stable thrombocytopenia.  He has chronic thrombocytopenia for several years, his baseline is about 80,000.  No complications related to the thrombocytopenia. From leukopenia standpoint, I do believe this could be related to the recent radiation hence I will see him 1 more time in about 6 months.  He was strongly encouraged to contact us sooner if needed.  If his leukopenia reverts back to normal since his polycythemia has also resolved, I will discharge him back to his primary care physician for his rest of health issues.  He is agreeable to this plan.  HISTORY OF PRESENTING ILLNESS:   Kenneth Giles 71 y.o. male is here because of polycythemia.   Interval history  Kenneth Giles is here for follow up. He is finally recovering slowly since completion of radiation in May 2023. He otherwise denies any new complaints. NO recent infections or interim hospitalizations. He continues on ADT monthly. He continues to drink one of the small bottle of screwdrivers about  couple times a week on an average.  REVIEW OF SYSTEMS:   Constitutional: Denies fevers,  chills or abnormal night sweats Eyes: Denies blurriness of vision, double vision or watery eyes Ears, nose, mouth, throat, and face: Denies mucositis or sore throat Respiratory: Denies cough, dyspnea or wheezes Cardiovascular: Denies palpitation, chest discomfort or lower extremity swelling Gastrointestinal:  Denies nausea, heartburn or change in bowel habits Skin: Denies abnormal skin rashes Lymphatics: Denies new lymphadenopathy or easy bruising Neurological:Denies numbness, tingling or new weaknesses Behavioral/Psych: Mood is stable, no new changes  All other systems were reviewed with the patient and are negative.  MEDICAL HISTORY:  Past Medical History:  Diagnosis Date   AICD (automatic cardioverter/defibrillator) present 12/28/2016   St Jude placed by dr g. taylor for ICM/ CHF   Allergic rhinitis, seasonal    Benign localized prostatic hyperplasia with lower urinary tract symptoms (LUTS)    CAD (coronary artery disease)    primary cardiology--- dr h. Tamala Julian;  a. 2014 CABG x 4 (L->LAD, V->Diag, V->OM, Rad->RCA);  b. 03/2013 Cath: LM nl, LAD 80p, LCX 44m OM1 100, RCA 70-859mall grafts patent, EF 40%.   Chronic low back pain    Chronic systolic CHF (congestive heart [place]) ([place]   followed by cardiology   CKD (chronic kidney disease), stage III (HCC)    Constipation, chronic    Deviated nasal septum    followed by dr shWilburn Cornelia-  severe   Diverticulosis of colon    Family history of adverse reaction to anesthesia    sister--- ponv   GERD (gastroesophageal reflux disease)    History of colon polyps    History of kidney stones    Hypertension    followed by cardiology and  pcp   Infection of great toe 04/12/2021   left ,  per pt due to ingrown toe nail   Ischemic cardiomyopathy    followed by dr h. Tamala Julian;;  a. 03/2013 Echo: EF 25-30%, diff HK, antsep/apical AK, Gr2 DD, Ao sclerosis, mild AI, PASP 36.;    last echo in epic 02-02-2020 , ef 60-65% with mild septal/ inferior  hypokinesis G1DD, mild AR no stenosis   Left bundle branch block    Lipoma of back    Malignant neoplasm prostate [facility])    urologist-- dr newsome/  radiation oncology-- dr Tammi Klippel;  dx 10/ 2022,  Gleason 4=%, PSA 7.84   Mild aortic valve regurgitation    per last echo in epic 01-2020  w/ mild calcification/ thickening but no sclerosis / stenosis   Mixed hyperlipidemia    takes Lipitor daily   OA (osteoarthritis)    hands   OSA (obstructive sleep apnea)    "dx'd; having another sleep study later this month; never followed up on getting a mask" (12/28/2016)   OSA on CPAP    followed by dr t. turner--- per study in epic 01-11-2017, Moderate OSA ( AHI of 23.1/hr with O2 desats as low as 81%.   Peripheral edema    Peripheral neuropathy    Polycythemia, secondary    followed by oncologist-- dr Lonell Face,  per last note in epic 02-03-2021 secondary to OSA and noncompliant with cpap   S/P CABG x 4 08/23/2012   LIMA-- LAD,  SVG --Diag,  SVG -- OM,  rad -- RCA   Thrombocytopenia ([place])    followed by oncology/ hematology--- dr Mamie Nick. Charla Criscione ([facility]),  hx phlebotomy   Type II diabetes mellitus ([place])    followed by pcp    (04-14-2021  pt stated checks blood sugar daily in am,  fasting sugar-- 90-150)   Wears hearing aid in both ears     SURGICAL HISTORY: Past Surgical History:  Procedure Laterality Date   BIV ICD INSERTION CRT-D N/A 12/28/2016   Procedure: BIV ICD INSERTION CRT-D;  Surgeon: Evans Lance, MD;  Location: [place] CV LAB;  Service: Cardiovascular;  Laterality: N/A;   COLONOSCOPY     CORONARY ARTERY BYPASS GRAFT N/A 08/23/2012   Procedure: CORONARY ARTERY BYPASS GRAFTING times four using Right Greater Saphenous Vein Graft harvested endoscopically and Left Radail Artery Graft;  Surgeon: Ivin Poot, MD;  Location: [place];  Service: Open Heart Surgery;  Laterality: N/A;   GOLD SEED IMPLANT N/A 04/19/2021   Procedure: GOLD SEED IMPLANT;  Surgeon: Remi Haggard, MD;   Location[facility];  Service: Urology;  Laterality: N/A;  30 MINS FOR THIS CASE   INTRAOPERATIVE TRANSESOPHAGEAL ECHOCARDIOGRAM N/A 08/23/2012   Procedure: INTRAOPERATIVE TRANSESOPHAGEAL ECHOCARDIOGRAM;  Surgeon: Ivin Poot, MD;  Location: [place];  Service: Open Heart Surgery;  Laterality: N/A;   KNEE ARTHROSCOPY Right 2013   LEFT HEART CATHETERIZATION WITH CORONARY ANGIOGRAM N/A 08/15/2012   Procedure: LEFT HEART CATHETERIZATION WITH CORONARY ANGIOGRAM;  Surgeon: Sinclair Grooms, MD;  Location: [facility] CATH LAB;  Service: Cardiovascular;  Laterality: N/A;   LEFT HEART CATHETERIZATION WITH CORONARY ANGIOGRAM N/A 04/04/2013   Procedure: LEFT HEART CATHETERIZATION WITH CORONARY ANGIOGRAM;  Surgeon: Troy Sine, MD;  Location: [facility] CATH LAB;  Service: Cardiovascular;  Laterality: N/A;   ORIF TIBIA PLATEAU Left 10/16/2008   @ [place];   AND ORIF LEFT HUMERUS SHAFT   RADIAL ARTERY HARVEST Left 08/23/2012  Procedure: RADIAL ARTERY HARVEST;  Surgeon: Ivin Poot, MD;  Location: [place];  Service: Open Heart Surgery;  Laterality: Left;   SPACE OAR INSTILLATION N/A 04/19/2021   Procedure: SPACE OAR INSTILLATION;  Surgeon: Remi Haggard, MD;  Location: [facility];  Service: Urology;  Laterality: N/A;    SOCIAL HISTORY: Social History   Socioeconomic History   Marital status: Widowed    Spouse name: Not on file   Number of children: Not on file   Years of education: Not on file   Highest education level: Not on file  Occupational History   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, fire and burglar alarms    Tobacco Use   Smoking status: Never   Smokeless tobacco: Never  Vaping Use   Vaping Use: Never used  Substance and Sexual Activity   Alcohol use: Yes    Comment: seldom   Drug use: No   Sexual activity: Not on file  Other Topics Concern   Not on file  Social History Narrative   Not on file   Social Determinants of Health    Financial Resource Strain: Not on file  Food Insecurity: Not on file  Transportation Needs: Not on file  Physical Activity: Not on file  Stress: Not on file  Social Connections: Not on file  Intimate Partner Violence: Not on file    FAMILY HISTORY: Family History  Problem Relation Age of Onset   Hypertension Mother    Hyperlipidemia Mother    Cancer Mother        uterine   Anemia Mother    Fibromyalgia Sister    Heart attack Paternal Uncle    Stroke Paternal Aunt     ALLERGIES:  is allergic to metformin hcl, procaine, rosuvastatin, spironolactone, and tylenol with codeine #3 [acetaminophen-codeine].  MEDICATIONS:  Current Outpatient Medications  Medication Sig Dispense Refill   aspirin EC 81 MG tablet Take 1 tablet (81 mg total) by mouth daily. 30 tablet 11   atorvastatin (LIPITOR) 40 MG tablet Take 40 mg by mouth daily at 6 PM.     B Complex CAPS Take 1 capsule by mouth daily.     calcium carbonate (OSCAL) 1500 (600 Ca) MG TABS tablet Take 600 mg by mouth 2 (two) times daily.     carvedilol (COREG) 25 MG tablet TAKE 1 TABLET BY MOUTH TWICE A DAY 180 tablet 3   cetirizine (ZYRTEC) 10 MG tablet Take 10 mg by mouth at bedtime.     cyclobenzaprine (FLEXERIL) 5 MG tablet Take 5 mg by mouth at bedtime as needed.     empagliflozin (JARDIANCE) 25 MG TABS tablet Take 1 tablet by mouth daily.     famotidine (PEPCID) 40 MG tablet Take 1 tablet by mouth at bedtime.     fenofibrate micronized (LOFIBRA) 200 MG capsule Take 200 mg by mouth daily before breakfast.     fluticasone (FLONASE) 50 MCG/ACT nasal spray 2 sprays at bedtime.     glimepiride (AMARYL) 1 MG tablet Take 1 mg by mouth at bedtime.     lisinopril (ZESTRIL) 40 MG tablet TAKE 1 TABLET BY MOUTH EVERY DAY 90 tablet 3   Methylcellulose, Laxative, (CITRUCEL PO) Take 500 mg daily as needed by mouth (constipation).      Misc Natural Products (GLUCOSAMINE-CHONDROITIN PLUS) TABS Take 1 tablet by mouth 2 (two) times daily.      Misc Natural Products (LUTEIN 20) CAPS Take 20 mg by  mouth every evening.     Multiple Vitamins-Minerals (ONE-A-DAY MENS 50+) TABS Take by mouth daily.     naproxen sodium (ALEVE) 220 MG tablet Take 220 mg by mouth daily as needed (pain).     nitroGLYCERIN (NITROSTAT) 0.4 MG SL tablet Place 1 tablet (0.4 mg total) under the tongue every 5 (five) minutes as needed for chest pain. 25 tablet 3   OMEGA 3 1000 MG CAPS Take 1 capsule by mouth daily.     oxyCODONE (OXY IR/ROXICODONE) 5 MG immediate release tablet Take 5 mg by mouth every 6 (six) hours as needed.     potassium chloride SA (K-DUR,KLOR-CON) 20 MEQ tablet Take 20 mEq by mouth 2 (two) times daily.      sodium chloride (OCEAN) 0.65 % SOLN nasal spray Place 2 sprays into both nostrils in the morning and at bedtime.  0   torsemide (DEMADEX) 10 MG tablet TAKE 1 TABLET BY MOUTH EVERY DAY 90 tablet 3   vitamin C (ASCORBIC ACID) 500 MG tablet Take 500 mg 2 (two) times daily by mouth.      No current facility-administered medications for this visit.   PHYSICAL EXAMINATION:  ECOG PERFORMANCE STATUS: 0 - Asymptomatic  Vitals:   12/06/21 1118  BP: (!) 142/72  Pulse: 63  Resp: 16  Temp: 97.9 F (36.6 C)  SpO2: 98%    Filed Weights   12/06/21 1118  Weight: 211 lb 6.4 oz (95.9 kg)   Physical Exam Constitutional:      Appearance: Normal appearance.  HENT:     Head: Normocephalic and atraumatic.  Cardiovascular:     Rate and Rhythm: Normal rate and regular rhythm.  Pulmonary:     Effort: Pulmonary effort is normal.     Breath sounds: Normal breath sounds.  Abdominal:     General: Abdomen is flat.  Musculoskeletal:        General: Normal range of motion.     Cervical back: Normal range of motion and neck supple.  Skin:    General: Skin is warm.  Neurological:     General: No focal deficit present.     Mental Status: He is alert.      LABORATORY DATA:  I have reviewed the data as listed Lab Results  Component Value Date    WBC 2.6 (L) 12/06/2021   HGB 13.5 12/06/2021   HCT 39.9 12/06/2021   MCV 83.8 12/06/2021   PLT 82 (L) 12/06/2021     Chemistry      Component Value Date/Time   NA 143 06/06/2021 1108   NA 143 12/22/2016 1550   K 4.0 06/06/2021 1108   CL 108 06/06/2021 1108   CO2 27 06/06/2021 1108   BUN 23 06/06/2021 1108   BUN 19 12/22/2016 1550   CREATININE 1.20 06/06/2021 1108      Component Value Date/Time   CALCIUM 9.2 06/06/2021 1108   ALKPHOS 50 06/06/2021 1108   AST 15 06/06/2021 1108   ALT 15 06/06/2021 1108   BILITOT 0.6 06/06/2021 1108     CBC from today reviewed.  RADIOGRAPHIC STUDIES: I have personally reviewed the radiological images as listed and agreed with the findings in the report. CUP PACEART REMOTE DEVICE CHECK  Result Date: 11/22/2021 Scheduled remote reviewed. Normal device function.  Next remote 91 days. [place]  Total time spent: 20 minutes including history, physical exam, review of records, counseling and coordination of care   Benay Pike, MD 12/06/2021 11:29 AM

## 2021-12-23 NOTE — Progress Notes (Signed)
Remote ICD transmission.   

## 2022-01-03 NOTE — Progress Notes (Unsigned)
Electrophysiology Office Note Date: 01/04/2022  ID:  Kenneth, Giles April 13, 1950, MRN [id number]  PCP: Lajean Manes, MD Primary Cardiologist: Sinclair Grooms, MD Electrophysiologist: Cristopher Peru, MD   CC: Routine ICD follow-up  Kenneth Giles is a 71 y.o. male seen today for Cristopher Peru, MD for routine electrophysiology followup. Since last being seen in our clinic the patient reports doing well from a cardiac perspective.  he denies chest pain, palpitations, dyspnea, PND, orthopnea, nausea, vomiting, dizziness, syncope, edema, weight gain, or early satiety.    He has not had ICD shocks.   Device History: St. Jude BiV ICD implanted 2018 for CHF   Past Medical History:  Diagnosis Date   AICD (automatic cardioverter/defibrillator) present 12/28/2016   St Jude placed by dr g. taylor for ICM/ CHF   Allergic rhinitis, seasonal    Benign localized prostatic hyperplasia with lower urinary tract symptoms (LUTS)    CAD (coronary artery disease)    primary cardiology--- dr h. Tamala Julian;  a. 2014 CABG x 4 (L->LAD, V->Diag, V->OM, Rad->RCA);  b. 03/2013 Cath: LM nl, LAD 80p, LCX 61m OM1 100, RCA 70-837mall grafts patent, EF 40%.   Chronic low back pain    Chronic systolic CHF (congestive heart failure) ([place]   followed by cardiology   CKD (chronic kidney disease), stage III (HCC)    Constipation, chronic    Deviated nasal septum    followed by dr shWilburn Cornelia-  severe   Diverticulosis of colon    Family history of adverse reaction to anesthesia    sister--- ponv   GERD (gastroesophageal reflux disease)    History of colon polyps    History of kidney stones    Hypertension    followed by cardiology and pcp   Infection of great toe 04/12/2021   left ,  per pt due to ingrown toe nail   Ischemic cardiomyopathy    followed by dr h. smTamala Julian  a. 03/2013 Echo: EF 25-30%, diff HK, antsep/apical AK, Gr2 DD, Ao sclerosis, mild AI, PASP 36.;    last echo in epic 02-02-2020 , ef  60-65% with mild septal/ inferior hypokinesis G1DD, mild AR no stenosis   Left bundle branch block    Lipoma of back    [facility]   [facility]rologist-- dr newsome/  radiation oncology-- dr maTammi Klippel dx 10/ 2022,  Gleason 4=%, PSA 7.84   Mild aortic valve regurgitation    per last echo in epic 01-2020  w/ mild calcification/ thickening but no sclerosis / stenosis   Mixed hyperlipidemia    takes Lipitor daily   OA (osteoarthritis)    hands   OSA (obstructive sleep apnea)    "dx'd; having another sleep study later this month; never followed up on getting a mask" (12/28/2016)   OSA on CPAP    followed by dr t. turner--- per study in epic 01-11-2017, Moderate OSA ( AHI of 23.1/hr with O2 desats as low as 81%.   Peripheral edema    Peripheral neuropathy    Polycythemia, secondary    followed by oncologist-- dr p.Lonell Face per last note in epic 02-03-2021 secondary to OSA and noncompliant with cpap   S/P CABG x 4 08/23/2012   LIMA-- LAD,  SVG --Diag,  SVG -- OM,  rad -- RCA   [place]ocyt[place] ([place]   followed by oncology/ hematology--- dr p.Mamie Nickiruku ([facility]),  hx phlebotomy   Type II diabetes mellitus (  [place])    followed by pcp    (04-14-2021  pt stated checks blood sugar daily in am,  fasting sugar-- 90-150)   Wears hearing aid in both ears    Past Surgical History:  Procedure Laterality Date   BIV ICD INSERTION CRT-D N/A 12/28/2016   Procedure: BIV ICD INSERTION CRT-D;  Surgeon: Evans Lance, MD;  Location: [place] CV LAB;  Service: Cardiovascular;  Laterality: N/A;   COLONOSCOPY     CORONARY ARTERY BYPASS GRAFT N/A 08/23/2012   Procedure: CORONARY ARTERY BYPASS GRAFTING times four using Right Greater Saphenous Vein Graft harvested endoscopically and Left Radail Artery Graft;  Surgeon: Ivin Poot, MD;  Location: [place];  Service: Open Heart Surgery;  Laterality: N/A;   GOLD SEED IMPLANT N/A 04/19/2021   Procedure: GOLD SEED IMPLANT;  Surgeon: Remi Haggard, MD;  Location[facility];  Service: Urology;  Laterality: N/A;  30 MINS FOR THIS CASE   INTRAOPERATIVE TRANSESOPHAGEAL ECHOCARDIOGRAM N/A 08/23/2012   Procedure: INTRAOPERATIVE TRANSESOPHAGEAL ECHOCARDIOGRAM;  Surgeon: Ivin Poot, MD;  Location: [place];  Service: Open Heart Surgery;  Laterality: N/A;   KNEE ARTHROSCOPY Right 2013   LEFT HEART CATHETERIZATION WITH CORONARY ANGIOGRAM N/A 08/15/2012   Procedure: LEFT HEART CATHETERIZATION WITH CORONARY ANGIOGRAM;  Surgeon: Sinclair Grooms, MD;  Location: [facility] CATH LAB;  Service: Cardiovascular;  Laterality: N/A;   LEFT HEART CATHETERIZATION WITH CORONARY ANGIOGRAM N/A 04/04/2013   Procedure: LEFT HEART CATHETERIZATION WITH CORONARY ANGIOGRAM;  Surgeon: Troy Sine, MD;  Location: [facility] CATH LAB;  Service: Cardiovascular;  Laterality: N/A;   ORIF TIBIA PLATEAU Left 10/16/2008   @ [place];   AND ORIF LEFT HUMERUS SHAFT   RADIAL ARTERY HARVEST Left 08/23/2012   Procedure: RADIAL ARTERY HARVEST;  Surgeon: Ivin Poot, MD;  Location: [place];  Service: Open Heart Surgery;  Laterality: Left;   SPACE OAR INSTILLATION N/A 04/19/2021   Procedure: SPACE OAR INSTILLATION;  Surgeon: Remi Haggard, MD;  Location[facility];  Service: Urology;  Laterality: N/A;    Current Outpatient Medications  Medication Sig Dispense Refill   aspirin EC 81 MG tablet Take 1 tablet (81 mg total) by mouth daily. 30 tablet 11   atorvastatin (LIPITOR) 40 MG tablet Take 40 mg by mouth daily at 6 PM.     B Complex CAPS Take 1 capsule by mouth daily.     calcium carbonate (OSCAL) 1500 (600 Ca) MG TABS tablet Take 600 mg by mouth 2 (two) times daily.     carvedilol (COREG) 25 MG tablet TAKE 1 TABLET BY MOUTH TWICE A DAY 180 tablet 3   cetirizine (ZYRTEC) 10 MG tablet Take 10 mg by mouth at bedtime.     empagliflozin (JARDIANCE) 25 MG TABS tablet Take 1 tablet by mouth daily.     famotidine (PEPCID) 40 MG tablet Take 1 tablet by mouth  at bedtime.     fenofibrate micronized (LOFIBRA) 200 MG capsule Take 200 mg by mouth daily before breakfast.     fluticasone (FLONASE) 50 MCG/ACT nasal spray 2 sprays at bedtime.     glimepiride (AMARYL) 1 MG tablet Take 1 mg by mouth at bedtime.     lisinopril (ZESTRIL) 40 MG tablet TAKE 1 TABLET BY MOUTH EVERY DAY 90 tablet 3   Methylcellulose, Laxative, (CITRUCEL PO) Take 500 mg daily as needed by mouth (constipation).      Misc Natural Products (GLUCOSAMINE-CHONDROITIN PLUS) TABS Take 1 tablet  by mouth 2 (two) times daily.     Misc Natural Products (LUTEIN 20) CAPS Take 20 mg by mouth every evening.     Multiple Vitamins-Minerals (ONE-A-DAY MENS 50+) TABS Take by mouth daily.     naproxen sodium (ALEVE) 220 MG tablet Take 220 mg by mouth daily as needed (pain).     nitroGLYCERIN (NITROSTAT) 0.4 MG SL tablet Place 1 tablet (0.4 mg total) under the tongue every 5 (five) minutes as needed for chest pain. 25 tablet 3   OMEGA 3 1000 MG CAPS Take 1 capsule by mouth daily.     potassium chloride SA (K-DUR,KLOR-CON) 20 MEQ tablet Take 20 mEq by mouth 2 (two) times daily.      sodium chloride (OCEAN) 0.65 % SOLN nasal spray Place 2 sprays into both nostrils in the morning and at bedtime.  0   torsemide (DEMADEX) 10 MG tablet TAKE 1 TABLET BY MOUTH EVERY DAY 90 tablet 3   vitamin C (ASCORBIC ACID) 500 MG tablet Take 500 mg 2 (two) times daily by mouth.      No current facility-administered medications for this visit.    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, fire and burglar alarms    Tobacco Use   Smoking status:  Never   Smokeless tobacco: Never  Vaping Use   Vaping Use: Never used  Substance and Sexual Activity   Alcohol use: Yes    Comment: seldom   Drug use: No   Sexual activity: Not on file  Other Topics Concern   Not on file  Social History Narrative   Not on file   Social Determinants of Health   Financial Resource Strain: Not on file  Food Insecurity: Not on file  Transportation Needs: Not on file  Physical Activity: Not on file  Stress: Not on file  Social Connections: Not on file  Intimate Partner Violence: Not on file    Family History: Family History  Problem Relation Age of Onset   Hypertension Mother    Hyperlipidemia Mother    Cancer Mother        uterine   Anemia Mother    Fibromyalgia Sister    Heart attack Paternal Uncle    Stroke Paternal Aunt     Review of Systems: All other systems reviewed and are otherwise negative except as noted above.   Physical Exam: Vitals:   01/04/22 1155  BP: 132/62  Pulse: 60  SpO2: 96%  Weight: 208 lb 9.6 oz (94.6 kg)  Height: '5\' 7"'$  (1.702 m)     GEN- The patient is well appearing, alert and oriented x 3 today.   HEENT: normocephalic, atraumatic; sclera clear, conjunctiva pink; hearing intact; oropharynx clear; neck supple, no JVP Lymph- no cervical lymphadenopathy Lungs- Clear to ausculation bilaterally, normal work of breathing.  No wheezes, rales, rhonchi Heart- Regular  rate and rhythm, no murmurs, rubs or gallops, PMI not laterally displaced GI- soft, non-tender, non-distended, bowel sounds present, no hepatosplenomegaly Extremities- no clubbing or cyanosis. No peripheral edema; DP/PT/radial pulses 2+ bilaterally MS- no significant deformity or atrophy Skin-  warm and dry, no rash or lesion; ICD pocket well healed Psych- euthymic mood, full affect Neuro- strength and sensation are intact  ICD interrogation- reviewed in detail today,  See PACEART report  EKG:  EKG is ordered today. Personal review of EKG  ordered today shows V pacing (BiV) at 60 bpm  Recent Labs: 12/06/2021: ALT 25; BUN 16; Creatinine 1.18; Hemoglobin 13.5; Platelet Count 82; Potassium 3.8; Sodium 141   Wt Readings from Last 3 Encounters:  01/04/22 208 lb 9.6 oz (94.6 kg)  12/06/21 211 lb 6.4 oz (95.9 kg)  06/06/21 202 lb 8 oz (91.9 kg)     Other studies Reviewed: Additional studies/ records that were reviewed today include: Previous EP office notes.   Assessment and Plan:  1.  Chronic systolic dysfunction s/p St. Jude CRT-D  euvolemic today Stable on an appropriate medical regimen Normal ICD function See Pace Art report No changes today  2. CAD No s/s ischemia Continue beta blocker Continue statin   3. Obesity Body mass index is 32.67 kg/m.  Encouraged lifestyle modification   Current medicines are reviewed at length with the patient today.    Labs/ tests ordered today include:  Labs 12/06/21 stable  Disposition:   Follow up with Dr. Lovena Le in 12 months , sooner with issues.  Jacalyn Lefevre, PA-C  01/04/2022 12:03 PM  [place] [address] [phone] (office) [phone] (fax)

## 2022-01-04 ENCOUNTER — Ambulatory Visit: Payer: Medicare Other | Attending: Student | Admitting: Student

## 2022-01-04 ENCOUNTER — Encounter: Payer: Self-pay | Admitting: Student

## 2022-01-04 VITALS — BP 132/62 | HR 60 | Ht 67.0 in | Wt 208.6 lb

## 2022-01-04 DIAGNOSIS — I2510 Atherosclerotic heart disease of native coronary artery without angina pectoris: Secondary | ICD-10-CM | POA: Insufficient documentation

## 2022-01-04 DIAGNOSIS — E669 Obesity, unspecified: Secondary | ICD-10-CM | POA: Insufficient documentation

## 2022-01-04 DIAGNOSIS — I5022 Chronic systolic (congestive) heart failure: Secondary | ICD-10-CM | POA: Diagnosis present

## 2022-01-04 DIAGNOSIS — I255 Ischemic cardiomyopathy: Secondary | ICD-10-CM

## 2022-01-04 DIAGNOSIS — Z95810 Presence of automatic (implantable) cardiac defibrillator: Secondary | ICD-10-CM | POA: Insufficient documentation

## 2022-01-04 LAB — CUP PACEART INCLINIC DEVICE CHECK
Battery Remaining Longevity: 34 mo
Brady Statistic RA Percent Paced: 39 %
Brady Statistic RV Percent Paced: 99.67 %
Date Time Interrogation Session: 20231122121426
HighPow Impedance: 68.625
Implantable Lead Connection Status: 753985
Implantable Lead Connection Status: 753985
Implantable Lead Connection Status: 753985
Implantable Lead Implant Date: 20181115
Implantable Lead Implant Date: 20181115
Implantable Lead Implant Date: 20181115
Implantable Lead Location: 753858
Implantable Lead Location: 753859
Implantable Lead Location: 753860
Implantable Lead Model: 7122
Implantable Pulse Generator Implant Date: 20181115
Lead Channel Impedance Value: 400 Ohm
Lead Channel Impedance Value: 450 Ohm
Lead Channel Impedance Value: 612.5 Ohm
Lead Channel Pacing Threshold Amplitude: 0.5 V
Lead Channel Pacing Threshold Amplitude: 0.625 V
Lead Channel Pacing Threshold Amplitude: 0.625 V
Lead Channel Pacing Threshold Pulse Width: 0.5 ms
Lead Channel Pacing Threshold Pulse Width: 0.5 ms
Lead Channel Pacing Threshold Pulse Width: 0.5 ms
Lead Channel Sensing Intrinsic Amplitude: 11.7 mV
Lead Channel Sensing Intrinsic Amplitude: 3.6 mV
Lead Channel Setting Pacing Amplitude: 1.5 V
Lead Channel Setting Pacing Amplitude: 2 V
Lead Channel Setting Pacing Amplitude: 2 V
Lead Channel Setting Pacing Pulse Width: 0.5 ms
Lead Channel Setting Pacing Pulse Width: 0.5 ms
Lead Channel Setting Sensing Sensitivity: 0.5 mV
Pulse Gen Serial Number: 9780956
Zone Setting Status: 755011

## 2022-01-04 NOTE — Patient Instructions (Signed)
Medication Instructions:  Your physician recommends that you continue on your current medications as directed. Please refer to the Current Medication list given to you today.  *If you need a refill on your cardiac medications before your next appointment, please call your pharmacy*   Lab Work: None   If you have labs (blood work) drawn today and your tests are completely normal, you will receive your results only by: [place] (if you have MyChart) OR A paper copy in the mail If you have any lab test that is abnormal or we need to change your treatment, we will call you to review the results.  Follow-Up: At [facility], you and your health needs are our priority.  As part of our continuing mission to provide you with exceptional heart care, we have created designated Provider Care Teams.  These Care Teams include your primary Cardiologist (physician) and Advanced Practice Providers (APPs -  Physician Assistants and Nurse Practitioners) who all work together to provide you with the care you need, when you need it.  We recommend signing up for the patient portal called "MyChart".  Sign up information is provided on this After Visit Summary.  MyChart is used to connect with patients for Virtual Visits (Telemedicine).  Patients are able to view lab/test results, encounter notes, upcoming appointments, etc.  Non-urgent messages can be sent to your provider as well.   To learn more about what you can do with MyChart, go to [email or web address].    Your next appointment:   1 year(s)  The format for your next appointment:   In Person  Provider:   Cristopher Peru, MD   Important Information About Sugar

## 2022-01-10 ENCOUNTER — Encounter: Payer: Self-pay | Admitting: Hematology and Oncology

## 2022-01-31 ENCOUNTER — Encounter: Payer: Self-pay | Admitting: Podiatry

## 2022-01-31 ENCOUNTER — Ambulatory Visit (INDEPENDENT_AMBULATORY_CARE_PROVIDER_SITE_OTHER): Payer: Medicare Other | Admitting: Podiatry

## 2022-01-31 DIAGNOSIS — M79674 Pain in right toe(s): Secondary | ICD-10-CM

## 2022-01-31 DIAGNOSIS — M79675 Pain in left toe(s): Secondary | ICD-10-CM

## 2022-01-31 DIAGNOSIS — E1159 Type 2 diabetes mellitus with other circulatory complications: Secondary | ICD-10-CM

## 2022-01-31 DIAGNOSIS — B351 Tinea unguium: Secondary | ICD-10-CM

## 2022-01-31 NOTE — Progress Notes (Signed)
This patient returns to my office for at risk foot care.  This patient requires this care by a professional since this patient will be at risk due to having diabetes and thrombocytopenia.  This patient is unable to cut nails himself since the patient cannot reach his nails.These nails are painful walking and wearing shoes.  This patient presents for at risk foot care today.  General Appearance  Alert, conversant and in no acute stress.  Vascular  Dorsalis pedis and posterior tibial  pulses are palpable  bilaterally.  Capillary return is within normal limits  bilaterally. Temperature is within normal limits  bilaterally.  Neurologic  Senn-Weinstein monofilament wire test within normal limits  bilaterally. Muscle power within normal limits bilaterally.  Nails Thick disfigured discolored nails with subungual debris  from hallux to fifth toes bilaterally. No evidence of bacterial infection or drainage bilaterally.  Orthopedic  No limitations of motion  feet .  No crepitus or effusions noted.  No bony pathology or digital deformities noted.  Skin  normotropic skin with no porokeratosis noted bilaterally.  No signs of infections or ulcers noted.     Onychomycosis  Pain in right toes  Pain in left toes  Consent was obtained for treatment procedures.   Mechanical debridement of nails 1-5  bilaterally performed with a nail nipper.  Filed with dremel without incident.    Return office visit    3 months                  Told patient to return for periodic foot care and evaluation due to potential at risk complications.   Gardiner Barefoot DPM

## 2022-02-20 ENCOUNTER — Ambulatory Visit (INDEPENDENT_AMBULATORY_CARE_PROVIDER_SITE_OTHER): Payer: Medicare Other

## 2022-02-20 DIAGNOSIS — I5022 Chronic systolic (congestive) heart failure: Secondary | ICD-10-CM | POA: Diagnosis not present

## 2022-02-21 LAB — CUP PACEART REMOTE DEVICE CHECK
Battery Remaining Longevity: 35 mo
Battery Remaining Percentage: 37 %
Battery Voltage: 2.92 V
Brady Statistic AP VP Percent: 21 %
Brady Statistic AP VS Percent: 1 %
Brady Statistic AS VP Percent: 79 %
Brady Statistic AS VS Percent: 1 %
Brady Statistic RA Percent Paced: 21 %
Date Time Interrogation Session: 20240108020018
HighPow Impedance: 72 Ohm
HighPow Impedance: 72 Ohm
Implantable Lead Connection Status: 753985
Implantable Lead Connection Status: 753985
Implantable Lead Connection Status: 753985
Implantable Lead Implant Date: 20181115
Implantable Lead Implant Date: 20181115
Implantable Lead Implant Date: 20181115
Implantable Lead Location: 753858
Implantable Lead Location: 753859
Implantable Lead Location: 753860
Implantable Lead Model: 7122
Implantable Pulse Generator Implant Date: 20181115
Lead Channel Impedance Value: 400 Ohm
Lead Channel Impedance Value: 450 Ohm
Lead Channel Impedance Value: 610 Ohm
Lead Channel Pacing Threshold Amplitude: 0.625 V
Lead Channel Pacing Threshold Amplitude: 0.625 V
Lead Channel Pacing Threshold Amplitude: 0.75 V
Lead Channel Pacing Threshold Pulse Width: 0.5 ms
Lead Channel Pacing Threshold Pulse Width: 0.5 ms
Lead Channel Pacing Threshold Pulse Width: 0.5 ms
Lead Channel Sensing Intrinsic Amplitude: 11.7 mV
Lead Channel Sensing Intrinsic Amplitude: 3.3 mV
Lead Channel Setting Pacing Amplitude: 1.625
Lead Channel Setting Pacing Amplitude: 2 V
Lead Channel Setting Pacing Amplitude: 2 V
Lead Channel Setting Pacing Pulse Width: 0.5 ms
Lead Channel Setting Pacing Pulse Width: 0.5 ms
Lead Channel Setting Sensing Sensitivity: 0.5 mV
Pulse Gen Serial Number: 9780956
Zone Setting Status: 755011

## 2022-03-28 NOTE — Progress Notes (Signed)
Carelink Summary Report / Loop Recorder 

## 2022-04-04 ENCOUNTER — Other Ambulatory Visit: Payer: Self-pay

## 2022-04-04 MED ORDER — LISINOPRIL 40 MG PO TABS: 40.0000 mg | ORAL_TABLET | Freq: Every day | ORAL | 0 refills | Status: DC

## 2022-04-04 MED ORDER — TORSEMIDE 10 MG PO TABS: 10.0000 mg | ORAL_TABLET | Freq: Every day | ORAL | 0 refills | Status: DC

## 2022-04-04 MED ORDER — CARVEDILOL 25 MG PO TABS: 25.0000 mg | ORAL_TABLET | Freq: Two times a day (BID) | ORAL | 0 refills | Status: DC

## 2022-04-04 NOTE — Addendum Note (Signed)
Addended by: Carter Kitten D on: 04/04/2022 10:16 AM   Modules accepted: Orders

## 2022-05-02 ENCOUNTER — Ambulatory Visit (INDEPENDENT_AMBULATORY_CARE_PROVIDER_SITE_OTHER): Payer: Medicare Other | Admitting: Podiatry

## 2022-05-02 ENCOUNTER — Encounter: Payer: Self-pay | Admitting: Podiatry

## 2022-05-02 DIAGNOSIS — M79675 Pain in left toe(s): Secondary | ICD-10-CM

## 2022-05-02 DIAGNOSIS — E1159 Type 2 diabetes mellitus with other circulatory complications: Secondary | ICD-10-CM | POA: Diagnosis not present

## 2022-05-02 DIAGNOSIS — B351 Tinea unguium: Secondary | ICD-10-CM | POA: Diagnosis not present

## 2022-05-02 DIAGNOSIS — M79674 Pain in right toe(s): Secondary | ICD-10-CM | POA: Diagnosis not present

## 2022-05-02 NOTE — Progress Notes (Signed)
This patient returns to my office for at risk foot care.  This patient requires this care by a professional since this patient will be at risk due to having diabetes and thrombocytopenia.  This patient is unable to cut nails himself since the patient cannot reach his nails.These nails are painful walking and wearing shoes. He says he is also having pain along the outside border left great toenail. This patient presents for at risk foot care today.  General Appearance  Alert, conversant and in no acute stress.  Vascular  Dorsalis pedis and posterior tibial  pulses are palpable  bilaterally.  Capillary return is within normal limits  bilaterally. Temperature is within normal limits  bilaterally.  Neurologic  Senn-Weinstein monofilament wire test within normal limits  bilaterally. Muscle power within normal limits bilaterally.  Nails Thick disfigured discolored nails with subungual debris  from hallux to fifth toes bilaterally. No evidence of bacterial infection or drainage bilaterally. Nail spicule growing into his distal nail groove. Desquamation noted.  Orthopedic  No limitations of motion  feet .  No crepitus or effusions noted.  No bony pathology or digital deformities noted.  Skin  normotropic skin with no porokeratosis noted bilaterally.  No signs of infections or ulcers noted.     Onychomycosis  Pain in right toes  Pain in left toes  Paronychia left hallux.  Consent was obtained for treatment procedures.   Mechanical debridement of nails 1-5  bilaterally performed with a nail nipper.  Filed with dremel without incident. Excision nail spicule left great toenail.   Return office visit    10 weeks                 Told patient to return for periodic foot care and evaluation due to potential at risk complications.   Gardiner Barefoot DPM

## 2022-05-09 ENCOUNTER — Ambulatory Visit: Payer: Medicare Other | Attending: Internal Medicine | Admitting: Internal Medicine

## 2022-05-09 ENCOUNTER — Encounter: Payer: Self-pay | Admitting: Internal Medicine

## 2022-05-09 VITALS — BP 112/58 | HR 70 | Ht 65.0 in | Wt 211.0 lb

## 2022-05-09 DIAGNOSIS — I2510 Atherosclerotic heart disease of native coronary artery without angina pectoris: Secondary | ICD-10-CM | POA: Diagnosis present

## 2022-05-09 DIAGNOSIS — Z95810 Presence of automatic (implantable) cardiac defibrillator: Secondary | ICD-10-CM | POA: Diagnosis present

## 2022-05-09 DIAGNOSIS — I10 Essential (primary) hypertension: Secondary | ICD-10-CM | POA: Diagnosis present

## 2022-05-09 DIAGNOSIS — I255 Ischemic cardiomyopathy: Secondary | ICD-10-CM | POA: Insufficient documentation

## 2022-05-09 DIAGNOSIS — I5022 Chronic systolic (congestive) heart failure: Secondary | ICD-10-CM | POA: Insufficient documentation

## 2022-05-09 DIAGNOSIS — I359 Nonrheumatic aortic valve disorder, unspecified: Secondary | ICD-10-CM | POA: Diagnosis present

## 2022-05-09 DIAGNOSIS — R600 Localized edema: Secondary | ICD-10-CM | POA: Insufficient documentation

## 2022-05-09 MED ORDER — TORSEMIDE 20 MG PO TABS: 20.0000 mg | ORAL_TABLET | Freq: Every day | ORAL | 3 refills | Status: DC

## 2022-05-09 NOTE — Patient Instructions (Signed)
Medication Instructions:  Your physician has recommended you make the following change in your medication:  INCREASE: Torsemide to 20 mg by mouth once daily  *If you need a refill on your cardiac medications before your next appointment, please call your pharmacy*   Lab Work: IN 2 WEEKS: BMP, BNP  If you have labs (blood work) drawn today and your tests are completely normal, you will receive your results only by: [place] (if you have MyChart) OR A paper copy in the mail If you have any lab test that is abnormal or we need to change your treatment, we will call you to review the results.   Testing/Procedures: Your physician has requested that you have an echocardiogram. Echocardiography is a painless test that uses sound waves to create images of your heart. It provides your doctor with information about the size and shape of your heart and how well your heart's chambers and valves are working. This procedure takes approximately one hour. There are no restrictions for this procedure. Please do NOT wear cologne, perfume, aftershave, or lotions (deodorant is allowed). Please arrive 15 minutes prior to your appointment time.    Follow-Up: At [facility], you and your health needs are our priority.  As part of our continuing mission to provide you with exceptional heart care, we have created designated Provider Care Teams.  These Care Teams include your primary Cardiologist (physician) and Advanced Practice Providers (APPs -  Physician Assistants and Nurse Practitioners) who all work together to provide you with the care you need, when you need it.   Your next appointment:   1 year(s)  Provider:   Werner Lean, MD
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
## 2022-05-22 ENCOUNTER — Ambulatory Visit (INDEPENDENT_AMBULATORY_CARE_PROVIDER_SITE_OTHER): Payer: Medicare Other

## 2022-05-22 DIAGNOSIS — I5022 Chronic systolic (congestive) heart failure: Secondary | ICD-10-CM

## 2022-05-22 DIAGNOSIS — I255 Ischemic cardiomyopathy: Secondary | ICD-10-CM

## 2022-05-22 LAB — CUP PACEART REMOTE DEVICE CHECK
Battery Remaining Longevity: 31 mo
Battery Remaining Percentage: 34 %
Battery Voltage: 2.92 V
Brady Statistic AP VP Percent: 27 %
Brady Statistic AP VS Percent: 1 %
Brady Statistic AS VP Percent: 72 %
Brady Statistic AS VS Percent: 1 %
Brady Statistic RA Percent Paced: 27 %
Date Time Interrogation Session: 20240408020018
HighPow Impedance: 71 Ohm
HighPow Impedance: 71 Ohm
Implantable Lead Connection Status: 753985
Implantable Lead Connection Status: 753985
Implantable Lead Connection Status: 753985
Implantable Lead Implant Date: 20181115
Implantable Lead Implant Date: 20181115
Implantable Lead Implant Date: 20181115
Implantable Lead Location: 753858
Implantable Lead Location: 753859
Implantable Lead Location: 753860
Implantable Lead Model: 7122
Implantable Pulse Generator Implant Date: 20181115
Lead Channel Impedance Value: 400 Ohm
Lead Channel Impedance Value: 450 Ohm
Lead Channel Impedance Value: 610 Ohm
Lead Channel Pacing Threshold Amplitude: 0.5 V
Lead Channel Pacing Threshold Amplitude: 0.625 V
Lead Channel Pacing Threshold Amplitude: 0.625 V
Lead Channel Pacing Threshold Pulse Width: 0.5 ms
Lead Channel Pacing Threshold Pulse Width: 0.5 ms
Lead Channel Pacing Threshold Pulse Width: 0.5 ms
Lead Channel Sensing Intrinsic Amplitude: 11.7 mV
Lead Channel Sensing Intrinsic Amplitude: 3.8 mV
Lead Channel Setting Pacing Amplitude: 1.5 V
Lead Channel Setting Pacing Amplitude: 2 V
Lead Channel Setting Pacing Amplitude: 2 V
Lead Channel Setting Pacing Pulse Width: 0.5 ms
Lead Channel Setting Pacing Pulse Width: 0.5 ms
Lead Channel Setting Sensing Sensitivity: 0.5 mV
Pulse Gen Serial Number: 9780956
Zone Setting Status: 755011

## 2022-05-24 ENCOUNTER — Ambulatory Visit: Payer: Medicare Other | Attending: Internal Medicine

## 2022-05-24 DIAGNOSIS — I5022 Chronic systolic (congestive) heart failure: Secondary | ICD-10-CM

## 2022-05-24 DIAGNOSIS — R600 Localized edema: Secondary | ICD-10-CM

## 2022-05-24 DIAGNOSIS — I359 Nonrheumatic aortic valve disorder, unspecified: Secondary | ICD-10-CM

## 2022-05-25 LAB — BASIC METABOLIC PANEL
BUN/Creatinine Ratio: 18 (ref 10–24)
BUN: 23 mg/dL (ref 8–27)
CO2: 21 mmol/L (ref 20–29)
Calcium: 9.4 mg/dL (ref 8.6–10.2)
Chloride: 106 mmol/L (ref 96–106)
Creatinine, Ser: 1.25 mg/dL (ref 0.76–1.27)
Glucose: 192 mg/dL — ABNORMAL HIGH (ref 70–99)
Potassium: 4 mmol/L (ref 3.5–5.2)
Sodium: 144 mmol/L (ref 134–144)
eGFR: 62 mL/min/{1.73_m2} (ref >59)

## 2022-05-25 LAB — PRO B NATRIURETIC PEPTIDE: NT-Pro BNP: 138 pg/mL (ref 0–376)

## 2022-06-07 ENCOUNTER — Ambulatory Visit: Payer: Medicare Other | Admitting: Hematology and Oncology

## 2022-06-07 ENCOUNTER — Other Ambulatory Visit: Payer: Medicare Other

## 2022-06-09 ENCOUNTER — Other Ambulatory Visit: Payer: Self-pay | Admitting: *Deleted

## 2022-06-09 DIAGNOSIS — D751 Secondary polycythemia: Secondary | ICD-10-CM

## 2022-06-12 ENCOUNTER — Inpatient Hospital Stay: Payer: Medicare Other | Attending: Hematology and Oncology

## 2022-06-12 ENCOUNTER — Inpatient Hospital Stay (HOSPITAL_BASED_OUTPATIENT_CLINIC_OR_DEPARTMENT_OTHER): Payer: Medicare Other | Admitting: Hematology and Oncology

## 2022-06-12 VITALS — BP 109/88 | HR 64 | Temp 97.5°F | Resp 18 | Ht 65.0 in | Wt 208.4 lb

## 2022-06-12 DIAGNOSIS — Z8049 Family history of malignant neoplasm of other genital organs: Secondary | ICD-10-CM | POA: Insufficient documentation

## 2022-06-12 DIAGNOSIS — D72819 Decreased white blood cell count, unspecified: Secondary | ICD-10-CM | POA: Diagnosis not present

## 2022-06-12 DIAGNOSIS — D751 Secondary polycythemia: Secondary | ICD-10-CM | POA: Diagnosis present

## 2022-06-12 DIAGNOSIS — D696 Thrombocytopenia, unspecified: Secondary | ICD-10-CM | POA: Diagnosis not present

## 2022-06-12 LAB — CBC WITH DIFFERENTIAL (CANCER CENTER ONLY)
Abs Immature Granulocytes: 0.02 10*3/uL (ref 0.00–0.07)
Basophils Absolute: 0 10*3/uL (ref 0.0–0.1)
Basophils Relative: 1 %
Eosinophils Absolute: 0.2 10*3/uL (ref 0.0–0.5)
Eosinophils Relative: 7 %
HCT: 40.7 % (ref 39.0–52.0)
Hemoglobin: 13.4 g/dL (ref 13.0–17.0)
Immature Granulocytes: 1 %
Lymphocytes Relative: 19 %
Lymphs Abs: 0.5 10*3/uL — ABNORMAL LOW (ref 0.7–4.0)
MCH: 28.2 pg (ref 26.0–34.0)
MCHC: 32.9 g/dL (ref 30.0–36.0)
MCV: 85.5 fL (ref 80.0–100.0)
Monocytes Absolute: 0.3 10*3/uL (ref 0.1–1.0)
Monocytes Relative: 10 %
Neutro Abs: 1.6 10*3/uL — ABNORMAL LOW (ref 1.7–7.7)
Neutrophils Relative %: 62 %
Platelet Count: 77 10*3/uL — ABNORMAL LOW (ref 150–400)
RBC: 4.76 MIL/uL (ref 4.22–5.81)
RDW: 15 % (ref 11.5–15.5)
WBC Count: 2.5 10*3/uL — ABNORMAL LOW (ref 4.0–10.5)
nRBC: 0 % (ref 0.0–0.2)

## 2022-06-12 NOTE — Progress Notes (Signed)
[facility] CONSULT NOTE  Patient Care Team: Merlene Laughter, MD as PCP - General (Internal Medicine) Quintella Reichert, MD as PCP - Sleep Medicine (Cardiology) Marinus Maw, MD as PCP - Electrophysiology (Cardiology) Christell Constant, MD as PCP - Cardiology (Cardiology) Lovett Sox, MD as Attending Physician (Cardiothoracic Surgery)  CHIEF COMPLAINTS/PURPOSE OF CONSULTATION:  FU about polycythemia and thrombocytopenia.  ASSESSMENT & PLAN:   This is a very pleasant 72 year old male patient with past medical history significant for coronary artery disease status post CABG, hypertension, dyslipidemia referred to hematology for evaluation of polycythemia.  He is here for follow-up.  Since his last visit, he denies any new health complaints.   He has some cardiology evaluation as scheduled in first week of May.  He denies any other complaints today.  No B symptoms, bleeding issues.  He has been rarely drinking alcohol.  Physical examination today with no major changes.  I have reviewed his labs which show stable leukopenia and thrombocytopenia.  No anemia.  We have once again discussed about role of bone marrow aspiration and biopsy but he would like to wait on it unless he has any change in his labs or in his symptoms.  This is reasonable.  He will return to clinic in 6 months or sooner as needed.  HISTORY OF PRESENTING ILLNESS:   Kenneth Giles 72 y.o. male is here because of polycythemia.   Interval history  Kenneth Giles is here for follow up. He is feeling better, had a great vacation to [place], drove his motorcycle in [place].  He denies any B symptoms.  No bleeding complaints.  He has been rarely drinking alcohol.  Rest of the pertinent 10 point ROS reviewed and negative  REVIEW OF SYSTEMS:   Constitutional: Denies fevers, chills or abnormal night sweats Eyes: Denies blurriness of vision, double vision or watery eyes Ears, nose, mouth, throat, and face:  Denies mucositis or sore throat Respiratory: Denies cough, dyspnea or wheezes Cardiovascular: Denies palpitation, chest discomfort or lower extremity swelling Gastrointestinal:  Denies nausea, heartburn or change in bowel habits Skin: Denies abnormal skin rashes Lymphatics: Denies new lymphadenopathy or easy bruising Neurological:Denies numbness, tingling or new weaknesses Behavioral/Psych: Mood is stable, no new changes  All other systems were reviewed with the patient and are negative.  MEDICAL HISTORY:  Past Medical History:  Diagnosis Date   AICD (automatic cardioverter/defibrillator) present 12/28/2016   St Jude placed by dr g. taylor for ICM/ CHF   Allergic rhinitis, seasonal    Benign localized prostatic hyperplasia with lower urinary tract symptoms (LUTS)    CAD (coronary artery disease)    primary cardiology--- dr h. Katrinka Blazing;  a. 2014 CABG x 4 (L->LAD, V->Diag, V->OM, Rad->RCA);  b. 03/2013 Cath: LM nl, LAD 80p, LCX 76m, OM1 100, RCA 70-65m, all grafts patent, EF 40%.   Chronic low back pain    Chronic systolic CHF (congestive heart failure) (HCC)    followed by cardiology   CKD (chronic kidney disease), stage III (HCC)    Constipation, chronic    Deviated nasal septum    followed by dr Annalee Genta---  severe   Diverticulosis of colon    Family history of adverse reaction to anesthesia    sister--- ponv   GERD (gastroesophageal reflux disease)    History of colon polyps    History of kidney stones    Hypertension    followed by cardiology and pcp   Infection of great toe 04/12/2021  left ,  per pt due to ingrown toe nail   Ischemic cardiomyopathy    followed by dr h. Katrinka Blazing;;  a. 03/2013 Echo: EF 25-30%, diff HK, antsep/apical AK, Gr2 DD, Ao sclerosis, mild AI, PASP 36.;    last echo in epic 02-02-2020 , ef 60-65% with mild septal/ inferior hypokinesis G1DD, mild AR no stenosis   Left bundle branch block    Lipoma of back    Malignant neoplasm prostate [facility])    urologist--  dr newsome/  radiation oncology-- dr Kathrynn Running;  dx 10/ 2022,  Gleason 4=%, PSA 7.84   Mild aortic valve regurgitation    per last echo in epic 01-2020  w/ mild calcification/ thickening but no sclerosis / stenosis   Mixed hyperlipidemia    takes Lipitor daily   OA (osteoarthritis)    hands   OSA (obstructive sleep apnea)    "dx'd; having another sleep study later this month; never followed up on getting a mask" (12/28/2016)   OSA on CPAP    followed by dr t. turner--- per study in epic 01-11-2017, Moderate OSA ( AHI of 23.1/hr with O2 desats as low as 81%.   Peripheral edema    Peripheral neuropathy    Polycythemia, secondary    followed by oncologist-- dr Rod Holler,  per last note in epic 02-03-2021 secondary to OSA and noncompliant with cpap   S/P CABG x 4 08/23/2012   LIMA-- LAD,  SVG --Diag,  SVG -- OM,  rad -- RCA   Thrombocytopenia (HCC)    followed by oncology/ hematology--- dr Demetrius Charity. Takuma Cifelli ([facility]),  hx phlebotomy   Type II diabetes mellitus (HCC)    followed by pcp    (04-14-2021  pt stated checks blood sugar daily in am,  fasting sugar-- 90-150)   Wears hearing aid in both ears     SURGICAL HISTORY: Past Surgical History:  Procedure Laterality Date   BIV ICD INSERTION CRT-D N/A 12/28/2016   Procedure: BIV ICD INSERTION CRT-D;  Surgeon: Marinus Maw, MD;  Location: [facility] INVASIVE CV LAB;  Service: Cardiovascular;  Laterality: N/A;   COLONOSCOPY     CORONARY ARTERY BYPASS GRAFT N/A 08/23/2012   Procedure: CORONARY ARTERY BYPASS GRAFTING times four using Right Greater Saphenous Vein Graft harvested endoscopically and Left Radail Artery Graft;  Surgeon: Kerin Perna, MD;  Location: [facility] OR;  Service: Open Heart Surgery;  Laterality: N/A;   GOLD SEED IMPLANT N/A 04/19/2021   Procedure: GOLD SEED IMPLANT;  Surgeon: Belva Agee, MD;  Location: [facility];  Service: Urology;  Laterality: N/A;  30 MINS FOR THIS CASE   INTRAOPERATIVE TRANSESOPHAGEAL  ECHOCARDIOGRAM N/A 08/23/2012   Procedure: INTRAOPERATIVE TRANSESOPHAGEAL ECHOCARDIOGRAM;  Surgeon: Kerin Perna, MD;  Location: [facility] OR;  Service: Open Heart Surgery;  Laterality: N/A;   KNEE ARTHROSCOPY Right 2013   LEFT HEART CATHETERIZATION WITH CORONARY ANGIOGRAM N/A 08/15/2012   Procedure: LEFT HEART CATHETERIZATION WITH CORONARY ANGIOGRAM;  Surgeon: Lesleigh Noe, MD;  Location: [facility] CATH LAB;  Service: Cardiovascular;  Laterality: N/A;   LEFT HEART CATHETERIZATION WITH CORONARY ANGIOGRAM N/A 04/04/2013   Procedure: LEFT HEART CATHETERIZATION WITH CORONARY ANGIOGRAM;  Surgeon: Lennette Bihari, MD;  Location: [facility] CATH LAB;  Service: Cardiovascular;  Laterality: N/A;   ORIF TIBIA PLATEAU Left 10/16/2008   @ [facility];   AND ORIF LEFT HUMERUS SHAFT   RADIAL ARTERY HARVEST Left 08/23/2012   Procedure: RADIAL ARTERY HARVEST;  Surgeon: Kathlee Nations Trigt,  MD;  Location: [facility] OR;  Service: Open Heart Surgery;  Laterality: Left;   SPACE OAR INSTILLATION N/A 04/19/2021   Procedure: SPACE OAR INSTILLATION;  Surgeon: Belva Agee, MD;  Location: [facility];  Service: Urology;  Laterality: N/A;    SOCIAL HISTORY: Social History   Socioeconomic History   Marital status: Widowed    Spouse name: Not on file   Number of children: Not on file   Years of education: Not on file   Highest education level: Not on file  Occupational History   Occupation: Surveyor, 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, fire and burglar alarms    Tobacco Use   Smoking status: Never   Smokeless tobacco: Never  Vaping Use   Vaping Use: Never used  Substance and Sexual Activity   Alcohol use: Yes    Comment: seldom   Drug use: No   Sexual activity: Not on file  Other Topics Concern   Not on file  Social History Narrative   Not on file   Social Determinants of Health   Financial Resource Strain: Not on file  Food Insecurity: Not on file  Transportation Needs: Not on file  Physical Activity: Not on file   Stress: Not on file  Social Connections: Not on file  Intimate Partner Violence: Not on file    FAMILY HISTORY: Family History  Problem Relation Age of Onset   Hypertension Mother    Hyperlipidemia Mother    Cancer Mother        uterine   Anemia Mother    Fibromyalgia Sister    Heart attack Paternal Uncle    Stroke Paternal Aunt     ALLERGIES:  is allergic to metformin hcl, procaine, rosuvastatin, spironolactone, and tylenol with codeine #3 [acetaminophen-codeine].  MEDICATIONS:  Current Outpatient Medications  Medication Sig Dispense Refill   aspirin EC 81 MG tablet Take 1 tablet (81 mg total) by mouth daily. 30 tablet 11   atorvastatin (LIPITOR) 40 MG tablet Take 40 mg by mouth daily at 6 PM.     B Complex CAPS Take 1 capsule by mouth daily.     calcium carbonate (OSCAL) 1500 (600 Ca) MG TABS tablet Take 600 mg by mouth 2 (two) times daily.     carvedilol (COREG) 25 MG tablet Take 1 tablet (25 mg total) by mouth 2 (two) times daily. 180 tablet 0   cetirizine (ZYRTEC) 10 MG tablet Take 10 mg by mouth at bedtime.     empagliflozin (JARDIANCE) 25 MG TABS tablet Take 1 tablet by mouth daily.     famotidine (PEPCID) 40 MG tablet Take 1 tablet by mouth at bedtime.     fenofibrate micronized (LOFIBRA) 200 MG capsule Take 200 mg by mouth daily before breakfast.     fluticasone (FLONASE) 50 MCG/ACT nasal spray 2 sprays at bedtime.     glimepiride (AMARYL) 1 MG tablet Take 1 mg by mouth at bedtime.     lisinopril (ZESTRIL) 40 MG tablet Take 1 tablet (40 mg total) by mouth daily. 90 tablet 0   Misc Natural Products (GLUCOSAMINE-CHONDROITIN PLUS) TABS Take 1 tablet by mouth 2 (two) times daily.     Multiple Vitamins-Minerals (ONE-A-DAY MENS 50+) TABS Take by mouth daily.     naproxen sodium (ALEVE) 220 MG tablet Take 220 mg by mouth daily as needed (pain).     nitroGLYCERIN (NITROSTAT) 0.4 MG SL tablet Place 1 tablet (0.4 mg total) under the tongue every 5 (  five) minutes as needed for  chest pain. 25 tablet 3   OMEGA 3 1000 MG CAPS Take 1 capsule by mouth daily.     potassium chloride SA (K-DUR,KLOR-CON) 20 MEQ tablet Take 20 mEq by mouth 2 (two) times daily.      saccharomyces boulardii (FLORASTOR) 250 MG capsule 2 (two) times daily.     sodium chloride (OCEAN) 0.65 % SOLN nasal spray Place 2 sprays into both nostrils in the morning and at bedtime.  0   tamsulosin (FLOMAX) 0.4 MG CAPS capsule daily at 6 (six) AM.     torsemide (DEMADEX) 20 MG tablet Take 1 tablet (20 mg total) by mouth daily. 90 tablet 3   vitamin C (ASCORBIC ACID) 500 MG tablet Take 500 mg 2 (two) times daily by mouth.      No current facility-administered medications for this visit.   PHYSICAL EXAMINATION:  ECOG PERFORMANCE STATUS: 0 - Asymptomatic  Vitals:   06/12/22 1105  BP: 109/88  Pulse: 64  Resp: 18  Temp: (!) 97.5 F (36.4 C)  SpO2: 100%    Filed Weights   06/12/22 1105  Weight: 208 lb 6.4 oz (94.5 kg)   Physical Exam Constitutional:      Appearance: Normal appearance.  HENT:     Head: Normocephalic and atraumatic.  Cardiovascular:     Rate and Rhythm: Normal rate and regular rhythm.  Pulmonary:     Effort: Pulmonary effort is normal.     Breath sounds: Normal breath sounds.  Abdominal:     General: Abdomen is flat.  Musculoskeletal:        General: Normal range of motion.     Cervical back: Normal range of motion and neck supple.  Skin:    General: Skin is warm.  Neurological:     General: No focal deficit present.     Mental Status: He is alert.      LABORATORY DATA:  I have reviewed the data as listed Lab Results  Component Value Date   WBC 2.5 (L) 06/12/2022   HGB 13.4 06/12/2022   HCT 40.7 06/12/2022   MCV 85.5 06/12/2022   PLT 77 (L) 06/12/2022     Chemistry      Component Value Date/Time   NA 144 05/24/2022 1310   K 4.0 05/24/2022 1310   CL 106 05/24/2022 1310   CO2 21 05/24/2022 1310   BUN 23 05/24/2022 1310   CREATININE 1.25 05/24/2022 1310    CREATININE 1.18 12/06/2021 1117      Component Value Date/Time   CALCIUM 9.4 05/24/2022 1310   ALKPHOS 67 12/06/2021 1117   AST 25 12/06/2021 1117   ALT 25 12/06/2021 1117   BILITOT 0.5 12/06/2021 1117     CBC from today reviewed.  RADIOGRAPHIC STUDIES: I have personally reviewed the radiological images as listed and agreed with the findings in the report. CUP PACEART REMOTE DEVICE CHECK  Result Date: 05/22/2022 Scheduled remote reviewed. Normal device function.  Next remote 91 days. Hassell Halim, RN, CCDS, [employer]  Total time spent: 20 minutes including history, physical exam, review of records, counseling and coordination of care   Rachel Moulds, MD 06/12/2022 11:25 AM

## 2022-06-14 ENCOUNTER — Ambulatory Visit (HOSPITAL_COMMUNITY): Payer: Medicare Other | Attending: Internal Medicine

## 2022-06-14 DIAGNOSIS — I359 Nonrheumatic aortic valve disorder, unspecified: Secondary | ICD-10-CM | POA: Diagnosis present

## 2022-06-14 DIAGNOSIS — I5022 Chronic systolic (congestive) heart failure: Secondary | ICD-10-CM | POA: Insufficient documentation

## 2022-06-14 LAB — ECHOCARDIOGRAM COMPLETE
Area-P 1/2: 3.56 cm2
P 1/2 time: 394 msec
S' Lateral: 3.1 cm

## 2022-06-14 MED ORDER — PERFLUTREN LIPID MICROSPHERE
1.0000 mL | INTRAVENOUS | Status: AC | PRN
  Administered 2022-06-14: 2 mL via INTRAVENOUS

## 2022-06-28 NOTE — Progress Notes (Signed)
Remote ICD transmission.   

## 2022-07-06 ENCOUNTER — Other Ambulatory Visit: Payer: Self-pay | Admitting: Internal Medicine

## 2022-07-06 MED ORDER — TORSEMIDE 20 MG PO TABS: 20.0000 mg | ORAL_TABLET | Freq: Every day | ORAL | 3 refills | Status: DC

## 2022-07-13 ENCOUNTER — Ambulatory Visit (INDEPENDENT_AMBULATORY_CARE_PROVIDER_SITE_OTHER): Payer: Medicare Other | Admitting: Podiatry

## 2022-07-13 ENCOUNTER — Encounter: Payer: Self-pay | Admitting: Podiatry

## 2022-07-13 DIAGNOSIS — M79675 Pain in left toe(s): Secondary | ICD-10-CM

## 2022-07-13 DIAGNOSIS — M79674 Pain in right toe(s): Secondary | ICD-10-CM | POA: Diagnosis not present

## 2022-07-13 DIAGNOSIS — B351 Tinea unguium: Secondary | ICD-10-CM

## 2022-07-13 DIAGNOSIS — E1159 Type 2 diabetes mellitus with other circulatory complications: Secondary | ICD-10-CM

## 2022-07-13 NOTE — Progress Notes (Signed)
This patient returns to my office for at risk foot care.  This patient requires this care by a professional since this patient will be at risk due to having diabetes and thrombocytopenia.  This patient is unable to cut nails himself since the patient cannot reach his nails.These nails are painful walking and wearing shoes.  This patient presents for at risk foot care today.  General Appearance  Alert, conversant and in no acute stress.  Vascular  Dorsalis pedis and posterior tibial  pulses are palpable  bilaterally.  Capillary return is within normal limits  bilaterally. Temperature is within normal limits  bilaterally.  Neurologic  Senn-Weinstein monofilament wire test within normal limits  bilaterally. Muscle power within normal limits bilaterally.  Nails Thick disfigured discolored nails with subungual debris  from hallux to fifth toes bilaterally. No evidence of bacterial infection or drainage bilaterally.  Orthopedic  No limitations of motion  feet .  No crepitus or effusions noted.  No bony pathology or digital deformities noted.  Skin  normotropic skin with no porokeratosis noted bilaterally.  No signs of infections or ulcers noted.     Onychomycosis  Pain in right toes  Pain in left toes    Consent was obtained for treatment procedures.   Mechanical debridement of nails 1-5  bilaterally performed with a nail nipper.  Filed with dremel without incident.    Return office visit    10 weeks                 Told patient to return for periodic foot care and evaluation due to potential at risk complications.   Helane Gunther DPM

## 2022-08-21 ENCOUNTER — Ambulatory Visit: Payer: Medicare Other

## 2022-08-21 DIAGNOSIS — I255 Ischemic cardiomyopathy: Secondary | ICD-10-CM

## 2022-08-21 DIAGNOSIS — I5022 Chronic systolic (congestive) heart failure: Secondary | ICD-10-CM | POA: Diagnosis not present

## 2022-08-22 LAB — CUP PACEART REMOTE DEVICE CHECK
Battery Remaining Longevity: 30 mo
Battery Remaining Percentage: 32 %
Battery Voltage: 2.9 V
Brady Statistic AP VP Percent: 31 %
Brady Statistic AP VS Percent: 1 %
Brady Statistic AS VP Percent: 68 %
Brady Statistic AS VS Percent: 1 %
Brady Statistic RA Percent Paced: 32 %
Date Time Interrogation Session: 20240708020017
HighPow Impedance: 73 Ohm
HighPow Impedance: 73 Ohm
Implantable Lead Connection Status: 753985
Implantable Lead Connection Status: 753985
Implantable Lead Connection Status: 753985
Implantable Lead Implant Date: 20181115
Implantable Lead Implant Date: 20181115
Implantable Lead Implant Date: 20181115
Implantable Lead Location: 753858
Implantable Lead Location: 753859
Implantable Lead Location: 753860
Implantable Lead Model: 7122
Implantable Pulse Generator Implant Date: 20181115
Lead Channel Impedance Value: 450 Ohm
Lead Channel Impedance Value: 450 Ohm
Lead Channel Impedance Value: 640 Ohm
Lead Channel Pacing Threshold Amplitude: 0.625 V
Lead Channel Pacing Threshold Amplitude: 0.625 V
Lead Channel Pacing Threshold Amplitude: 0.625 V
Lead Channel Pacing Threshold Pulse Width: 0.5 ms
Lead Channel Pacing Threshold Pulse Width: 0.5 ms
Lead Channel Pacing Threshold Pulse Width: 0.5 ms
Lead Channel Sensing Intrinsic Amplitude: 11.7 mV
Lead Channel Sensing Intrinsic Amplitude: 3.6 mV
Lead Channel Setting Pacing Amplitude: 1.625
Lead Channel Setting Pacing Amplitude: 2 V
Lead Channel Setting Pacing Amplitude: 2 V
Lead Channel Setting Pacing Pulse Width: 0.5 ms
Lead Channel Setting Pacing Pulse Width: 0.5 ms
Lead Channel Setting Sensing Sensitivity: 0.5 mV
Pulse Gen Serial Number: 9780956
Zone Setting Status: 755011

## 2022-09-06 NOTE — Progress Notes (Signed)
Remote ICD transmission.   

## 2022-09-21 ENCOUNTER — Ambulatory Visit (INDEPENDENT_AMBULATORY_CARE_PROVIDER_SITE_OTHER): Payer: Medicare Other | Admitting: Podiatry

## 2022-09-21 ENCOUNTER — Encounter: Payer: Self-pay | Admitting: Podiatry

## 2022-09-21 DIAGNOSIS — E1159 Type 2 diabetes mellitus with other circulatory complications: Secondary | ICD-10-CM | POA: Diagnosis not present

## 2022-09-21 DIAGNOSIS — S90421A Blister (nonthermal), right great toe, initial encounter: Secondary | ICD-10-CM | POA: Diagnosis not present

## 2022-09-21 DIAGNOSIS — S90423A Blister (nonthermal), unspecified great toe, initial encounter: Secondary | ICD-10-CM | POA: Insufficient documentation

## 2022-09-21 DIAGNOSIS — B351 Tinea unguium: Secondary | ICD-10-CM

## 2022-09-21 DIAGNOSIS — M79674 Pain in right toe(s): Secondary | ICD-10-CM | POA: Diagnosis not present

## 2022-09-21 DIAGNOSIS — M79675 Pain in left toe(s): Secondary | ICD-10-CM

## 2022-09-21 NOTE — Progress Notes (Signed)
This patient returns to my office for at risk foot care.  This patient requires this care by a professional since this patient will be at risk due to having diabetes and thrombocytopenia.  This patient is unable to cut nails himself since the patient cannot reach his nails.These nails are painful walking and wearing shoes.This patient presents to the office saying he has pain under his toenail right big toe.  No history of trauma.  He says this has happened before to him.  This patient presents for at risk foot care today.  General Appearance  Alert, conversant and in no acute stress.  Vascular  Dorsalis pedis and posterior tibial  pulses are palpable  bilaterally.  Capillary return is within normal limits  bilaterally. Temperature is within normal limits  bilaterally.  Neurologic  Senn-Weinstein monofilament wire test within normal limits  bilaterally. Muscle power within normal limits bilaterally.  Nails Thick disfigured discolored nails with subungual debris  from hallux to fifth toes bilaterally. No evidence of bacterial infection or drainage bilaterally.  Orthopedic  No limitations of motion  feet .  No crepitus or effusions noted.  No bony pathology or digital deformities noted.  Skin  normotropic skin with no porokeratosis noted bilaterally.  No signs of infections or ulcers noted.  Subungual blister right hallux.   Onychomycosis  Pain in right toes  Pain in left toes  Subungual blister right hallux.  Consent was obtained for treatment procedures.   Mechanical debridement of nails 1-5  bilaterally performed with a nail nipper.  Filed with dremel without incident. Incision and drainage blister right hallux.  Neosporin/DSD.  Patient wants the toe to heal like his previous nail problems.   Return office visit    10 weeks                 Told patient to return for periodic foot care and evaluation due to potential at risk complications.   Helane Gunther DPM

## 2022-10-24 ENCOUNTER — Other Ambulatory Visit: Payer: Self-pay | Admitting: Internal Medicine

## 2022-10-24 ENCOUNTER — Encounter: Payer: Self-pay | Admitting: Hematology and Oncology

## 2022-10-24 ENCOUNTER — Ambulatory Visit
Admission: RE | Admit: 2022-10-24 | Discharge: 2022-10-24 | Disposition: A | Discharge status: Home / Self Care | Payer: Medicare Other | Source: Ambulatory Visit | Attending: Internal Medicine | Admitting: Internal Medicine

## 2022-10-24 DIAGNOSIS — M25551 Pain in right hip: Secondary | ICD-10-CM

## 2022-11-20 ENCOUNTER — Ambulatory Visit: Payer: Medicare Other

## 2022-11-20 DIAGNOSIS — I255 Ischemic cardiomyopathy: Secondary | ICD-10-CM | POA: Diagnosis not present

## 2022-11-20 DIAGNOSIS — I5022 Chronic systolic (congestive) heart failure: Secondary | ICD-10-CM

## 2022-11-20 LAB — CUP PACEART REMOTE DEVICE CHECK
Battery Remaining Longevity: 26 mo
Battery Remaining Percentage: 29 %
Battery Voltage: 2.9 V
Brady Statistic AP VP Percent: 36 %
Brady Statistic AP VS Percent: 1 %
Brady Statistic AS VP Percent: 64 %
Brady Statistic AS VS Percent: 1 %
Brady Statistic RA Percent Paced: 36 %
Date Time Interrogation Session: 20241007020021
HighPow Impedance: 75 Ohm
HighPow Impedance: 75 Ohm
Implantable Lead Connection Status: 753985
Implantable Lead Connection Status: 753985
Implantable Lead Connection Status: 753985
Implantable Lead Implant Date: 20181115
Implantable Lead Implant Date: 20181115
Implantable Lead Implant Date: 20181115
Implantable Lead Location: 753858
Implantable Lead Location: 753859
Implantable Lead Location: 753860
Implantable Lead Model: 7122
Implantable Pulse Generator Implant Date: 20181115
Lead Channel Impedance Value: 410 Ohm
Lead Channel Impedance Value: 410 Ohm
Lead Channel Impedance Value: 610 Ohm
Lead Channel Pacing Threshold Amplitude: 0.5 V
Lead Channel Pacing Threshold Amplitude: 0.5 V
Lead Channel Pacing Threshold Amplitude: 0.75 V
Lead Channel Pacing Threshold Pulse Width: 0.5 ms
Lead Channel Pacing Threshold Pulse Width: 0.5 ms
Lead Channel Pacing Threshold Pulse Width: 0.5 ms
Lead Channel Sensing Intrinsic Amplitude: 11.7 mV
Lead Channel Sensing Intrinsic Amplitude: 2.8 mV
Lead Channel Setting Pacing Amplitude: 1.5 V
Lead Channel Setting Pacing Amplitude: 2 V
Lead Channel Setting Pacing Amplitude: 2 V
Lead Channel Setting Pacing Pulse Width: 0.5 ms
Lead Channel Setting Pacing Pulse Width: 0.5 ms
Lead Channel Setting Sensing Sensitivity: 0.5 mV
Pulse Gen Serial Number: 9780956
Zone Setting Status: 755011

## 2022-12-04 NOTE — Progress Notes (Signed)
Remote ICD transmission.   

## 2022-12-08 ENCOUNTER — Other Ambulatory Visit: Payer: Self-pay | Admitting: *Deleted

## 2022-12-08 DIAGNOSIS — D751 Secondary polycythemia: Secondary | ICD-10-CM

## 2022-12-12 ENCOUNTER — Inpatient Hospital Stay: Payer: Medicare Other | Attending: Hematology and Oncology | Admitting: Hematology and Oncology

## 2022-12-12 ENCOUNTER — Inpatient Hospital Stay: Payer: Medicare Other

## 2022-12-12 ENCOUNTER — Encounter: Payer: Self-pay | Admitting: Hematology and Oncology

## 2022-12-12 VITALS — BP 124/53 | HR 69 | Temp 97.5°F | Resp 16 | Wt 210.9 lb

## 2022-12-12 DIAGNOSIS — D72819 Decreased white blood cell count, unspecified: Secondary | ICD-10-CM | POA: Insufficient documentation

## 2022-12-12 DIAGNOSIS — D696 Thrombocytopenia, unspecified: Secondary | ICD-10-CM | POA: Insufficient documentation

## 2022-12-12 DIAGNOSIS — Z8049 Family history of malignant neoplasm of other genital organs: Secondary | ICD-10-CM | POA: Diagnosis not present

## 2022-12-12 DIAGNOSIS — D751 Secondary polycythemia: Secondary | ICD-10-CM | POA: Insufficient documentation

## 2022-12-12 DIAGNOSIS — M25551 Pain in right hip: Secondary | ICD-10-CM | POA: Diagnosis not present

## 2022-12-12 LAB — CBC WITH DIFFERENTIAL (CANCER CENTER ONLY)
Abs Immature Granulocytes: 0.01 10*3/uL (ref 0.00–0.07)
Basophils Absolute: 0 10*3/uL (ref 0.0–0.1)
Basophils Relative: 1 %
Eosinophils Absolute: 0.1 10*3/uL (ref 0.0–0.5)
Eosinophils Relative: 5 %
HCT: 42.1 % (ref 39.0–52.0)
Hemoglobin: 13.9 g/dL (ref 13.0–17.0)
Immature Granulocytes: 0 %
Lymphocytes Relative: 21 %
Lymphs Abs: 0.5 10*3/uL — ABNORMAL LOW (ref 0.7–4.0)
MCH: 28.3 pg (ref 26.0–34.0)
MCHC: 33 g/dL (ref 30.0–36.0)
MCV: 85.6 fL (ref 80.0–100.0)
Monocytes Absolute: 0.2 10*3/uL (ref 0.1–1.0)
Monocytes Relative: 9 %
Neutro Abs: 1.5 10*3/uL — ABNORMAL LOW (ref 1.7–7.7)
Neutrophils Relative %: 64 %
Platelet Count: 84 10*3/uL — ABNORMAL LOW (ref 150–400)
RBC: 4.92 MIL/uL (ref 4.22–5.81)
RDW: 14.7 % (ref 11.5–15.5)
WBC Count: 2.4 10*3/uL — ABNORMAL LOW (ref 4.0–10.5)
nRBC: 0 % (ref 0.0–0.2)

## 2022-12-12 NOTE — Progress Notes (Signed)
[facility] CONSULT NOTE  Patient Care Team: Merlene Laughter, MD (Inactive) as PCP - General (Internal Medicine) Quintella Reichert, MD as PCP - Sleep Medicine (Cardiology) Marinus Maw, MD as PCP - Electrophysiology (Cardiology) Christell Constant, MD as PCP - Cardiology (Cardiology) Lovett Sox, MD as Attending Physician (Cardiothoracic Surgery)  CHIEF COMPLAINTS/PURPOSE OF CONSULTATION:  FU about polycythemia and thrombocytopenia.  ASSESSMENT & PLAN:   This is a very pleasant 72 year old male patient with past medical history significant for coronary artery disease status post CABG, hypertension, dyslipidemia referred to hematology for evaluation of cytopenias.  Leukopenia and thrombocytopenia Mildly low white blood cell and platelet counts, stable compared to last year. Hemoglobin normal. -Continue to monitor blood counts every 6 months. -Consider BMB if cytopenias worsen  Right Hip Pain Increased pain, possibly due to altered mechanics from favoring right leg after previous left knee injury. No clear etiology on imaging. Currently undergoing physical therapy. -Continue physical therapy and monitor for improvement.  General Health Maintenance -Continue current medications.  -Return in 6 months for routine follow-up, sooner if any changes or concerns.  HISTORY OF PRESENTING ILLNESS:   Kenneth Giles 72 y.o. male is here because of polycythemia.   Interval history  Discussed the use of AI scribe software for clinical note transcription with the patient, who gave verbal consent to proceed.  History of Present Illness    The patient presents with increased right hip pain. He reports that the pain varies day to day, with some days allowing him to walk two to three blocks without issue, while other days the pain becomes bothersome after just one block. He is currently attending physical therapy for the issue. The patient denies any known injury to the  hip, but does recall a significant injury to the knee in 2010, which required a plate and screw. He believes he may have favored his right leg following this injury, potentially leading to the current hip issue.  In addition to the hip pain, the patient reports persistent pain in his arm following a COVID-19 vaccination. The pain is not constant, but is triggered by certain movements and positions. He is considering requesting the next COVID-19 vaccination in his leg to avoid further arm pain.  The patient also mentions a past issue with high blood counts, which have remained stable since a blood donation procedure. He reports occasional alcohol consumption, but denies any issues with breathing, bowel movements, or urination. He has a history of falling, with the most recent incident occurring in June.  Rest of the pertinent 10 point ROS reviewed and neg  MEDICAL HISTORY:  Past Medical History:  Diagnosis Date   AICD (automatic cardioverter/defibrillator) present 12/28/2016   St Jude placed by dr g. taylor for ICM/ CHF   Allergic rhinitis, seasonal    Benign localized prostatic hyperplasia with lower urinary tract symptoms (LUTS)    CAD (coronary artery disease)    primary cardiology--- dr h. Katrinka Blazing;  a. 2014 CABG x 4 (L->LAD, V->Diag, V->OM, Rad->RCA);  b. 03/2013 Cath: LM nl, LAD 80p, LCX 15m, OM1 100, RCA 70-32m, all grafts patent, EF 40%.   Chronic low back pain    Chronic systolic CHF (congestive heart failure) (HCC)    followed by cardiology   CKD (chronic kidney disease), stage III (HCC)    Constipation, chronic    Deviated nasal septum    followed by dr Annalee Genta---  severe   Diverticulosis of colon    Family history of  adverse reaction to anesthesia    sister--- ponv   GERD (gastroesophageal reflux disease)    History of colon polyps    History of kidney stones    Hypertension    followed by cardiology and pcp   Infection of great toe 04/12/2021   left ,  per pt due to ingrown  toe nail   Ischemic cardiomyopathy    followed by dr h. Katrinka Blazing;;  a. 03/2013 Echo: EF 25-30%, diff HK, antsep/apical AK, Gr2 DD, Ao sclerosis, mild AI, PASP 36.;    last echo in epic 02-02-2020 , ef 60-65% with mild septal/ inferior hypokinesis G1DD, mild AR no stenosis   Left bundle branch block    Lipoma of back    Malignant neoplasm prostate [facility])    urologist-- dr newsome/  radiation oncology-- dr Kathrynn Running;  dx 10/ 2022,  Gleason 4=%, PSA 7.84   Mild aortic valve regurgitation    per last echo in epic 01-2020  w/ mild calcification/ thickening but no sclerosis / stenosis   Mixed hyperlipidemia    takes Lipitor daily   OA (osteoarthritis)    hands   OSA (obstructive sleep apnea)    "dx'd; having another sleep study later this month; never followed up on getting a mask" (12/28/2016)   OSA on CPAP    followed by dr t. turner--- per study in epic 01-11-2017, Moderate OSA ( AHI of 23.1/hr with O2 desats as low as 81%.   Peripheral edema    Peripheral neuropathy    Polycythemia, secondary    followed by oncologist-- dr Rod Holler,  per last note in epic 02-03-2021 secondary to OSA and noncompliant with cpap   S/P CABG x 4 08/23/2012   LIMA-- LAD,  SVG --Diag,  SVG -- OM,  rad -- RCA   Thrombocytopenia (HCC)    followed by oncology/ hematology--- dr Demetrius Charity. Bryndle Corredor ([facility]),  hx phlebotomy   Type II diabetes mellitus (HCC)    followed by pcp    (04-14-2021  pt stated checks blood sugar daily in am,  fasting sugar-- 90-150)   Wears hearing aid in both ears     SURGICAL HISTORY: Past Surgical History:  Procedure Laterality Date   BIV ICD INSERTION CRT-D N/A 12/28/2016   Procedure: BIV ICD INSERTION CRT-D;  Surgeon: Marinus Maw, MD;  Location: [facility] INVASIVE CV LAB;  Service: Cardiovascular;  Laterality: N/A;   COLONOSCOPY     CORONARY ARTERY BYPASS GRAFT N/A 08/23/2012   Procedure: CORONARY ARTERY BYPASS GRAFTING times four using Right Greater Saphenous Vein Graft harvested  endoscopically and Left Radail Artery Graft;  Surgeon: Kerin Perna, MD;  Location: [facility] OR;  Service: Open Heart Surgery;  Laterality: N/A;   GOLD SEED IMPLANT N/A 04/19/2021   Procedure: GOLD SEED IMPLANT;  Surgeon: Belva Agee, MD;  Location: [facility];  Service: Urology;  Laterality: N/A;  30 MINS FOR THIS CASE   INTRAOPERATIVE TRANSESOPHAGEAL ECHOCARDIOGRAM N/A 08/23/2012   Procedure: INTRAOPERATIVE TRANSESOPHAGEAL ECHOCARDIOGRAM;  Surgeon: Kerin Perna, MD;  Location: [facility] OR;  Service: Open Heart Surgery;  Laterality: N/A;   KNEE ARTHROSCOPY Right 2013   LEFT HEART CATHETERIZATION WITH CORONARY ANGIOGRAM N/A 08/15/2012   Procedure: LEFT HEART CATHETERIZATION WITH CORONARY ANGIOGRAM;  Surgeon: Lesleigh Noe, MD;  Location: [facility] CATH LAB;  Service: Cardiovascular;  Laterality: N/A;   LEFT HEART CATHETERIZATION WITH CORONARY ANGIOGRAM N/A 04/04/2013   Procedure: LEFT HEART CATHETERIZATION WITH CORONARY ANGIOGRAM;  Surgeon: Maisie Fus  Alphonsus Sias, MD;  Location: [facility] CATH LAB;  Service: Cardiovascular;  Laterality: N/A;   ORIF TIBIA PLATEAU Left 10/16/2008   @ [facility];   AND ORIF LEFT HUMERUS SHAFT   RADIAL ARTERY HARVEST Left 08/23/2012   Procedure: RADIAL ARTERY HARVEST;  Surgeon: Kerin Perna, MD;  Location: [facility] OR;  Service: Open Heart Surgery;  Laterality: Left;   SPACE OAR INSTILLATION N/A 04/19/2021   Procedure: SPACE OAR INSTILLATION;  Surgeon: Belva Agee, MD;  Location: [facility];  Service: Urology;  Laterality: N/A;    SOCIAL HISTORY: Social History   Socioeconomic History   Marital status: Widowed    Spouse name: Not on file   Number of children: Not on file   Years of education: Not on file   Highest education level: Not on file  Occupational History   Occupation: Surveyor, 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, fire and burglar alarms    Tobacco Use   Smoking status: Never   Smokeless tobacco: Never  Vaping Use   Vaping status: Never  Used  Substance and Sexual Activity   Alcohol use: Yes    Comment: seldom   Drug use: No   Sexual activity: Not on file  Other Topics Concern   Not on file  Social History Narrative   Not on file   Social Determinants of Health   Financial Resource Strain: Not on file  Food Insecurity: Not on file  Transportation Needs: Not on file  Physical Activity: Not on file  Stress: Not on file  Social Connections: Not on file  Intimate Partner Violence: Not on file    FAMILY HISTORY: Family History  Problem Relation Age of Onset   Hypertension Mother    Hyperlipidemia Mother    Cancer Mother        uterine   Anemia Mother    Fibromyalgia Sister    Heart attack Paternal Uncle    Stroke Paternal Aunt     ALLERGIES:  is allergic to metformin hcl, procaine, rosuvastatin, spironolactone, and tylenol with codeine #3 [acetaminophen-codeine].  MEDICATIONS:  Current Outpatient Medications  Medication Sig Dispense Refill   aspirin EC 81 MG tablet Take 1 tablet (81 mg total) by mouth daily. 30 tablet 11   atorvastatin (LIPITOR) 40 MG tablet Take 40 mg by mouth daily at 6 PM.     B Complex CAPS Take 1 capsule by mouth daily.     calcium carbonate (OSCAL) 1500 (600 Ca) MG TABS tablet Take 600 mg by mouth 2 (two) times daily.     carvedilol (COREG) 25 MG tablet TAKE 1 TABLET BY MOUTH TWICE A DAY 180 tablet 3   cetirizine (ZYRTEC) 10 MG tablet Take 10 mg by mouth at bedtime.     empagliflozin (JARDIANCE) 25 MG TABS tablet Take 1 tablet by mouth daily.     famotidine (PEPCID) 40 MG tablet Take 1 tablet by mouth at bedtime.     fenofibrate micronized (LOFIBRA) 200 MG capsule Take 200 mg by mouth daily before breakfast.     fluticasone (FLONASE) 50 MCG/ACT nasal spray 2 sprays at bedtime.     glimepiride (AMARYL) 1 MG tablet Take 1 mg by mouth at bedtime.     lisinopril (ZESTRIL) 40 MG tablet TAKE 1 TABLET BY MOUTH EVERY DAY 90 tablet 3   Misc Natural Products (GLUCOSAMINE-CHONDROITIN PLUS)  TABS Take 1 tablet by mouth 2 (two) times daily.     Multiple Vitamins-Minerals (ONE-A-DAY MENS 50+)  TABS Take by mouth daily.     naproxen sodium (ALEVE) 220 MG tablet Take 220 mg by mouth daily as needed (pain).     nitroGLYCERIN (NITROSTAT) 0.4 MG SL tablet Place 1 tablet (0.4 mg total) under the tongue every 5 (five) minutes as needed for chest pain. 25 tablet 3   OMEGA 3 1000 MG CAPS Take 1 capsule by mouth daily.     potassium chloride SA (K-DUR,KLOR-CON) 20 MEQ tablet Take 20 mEq by mouth 2 (two) times daily.      saccharomyces boulardii (FLORASTOR) 250 MG capsule 2 (two) times daily.     sodium chloride (OCEAN) 0.65 % SOLN nasal spray Place 2 sprays into both nostrils in the morning and at bedtime.  0   tamsulosin (FLOMAX) 0.4 MG CAPS capsule daily at 6 (six) AM.     torsemide (DEMADEX) 20 MG tablet Take 1 tablet (20 mg total) by mouth daily. 90 tablet 3   vitamin C (ASCORBIC ACID) 500 MG tablet Take 500 mg 2 (two) times daily by mouth.      No current facility-administered medications for this visit.   PHYSICAL EXAMINATION:  ECOG PERFORMANCE STATUS: 0 - Asymptomatic  Vitals:   12/12/22 1127  BP: (!) 124/53  Pulse: 69  Resp: 16  Temp: (!) 97.5 F (36.4 C)  SpO2: 98%    Filed Weights   12/12/22 1127  Weight: 210 lb 14.4 oz (95.7 kg)   Physical Exam Constitutional:      Appearance: Normal appearance.  HENT:     Head: Normocephalic and atraumatic.  Cardiovascular:     Rate and Rhythm: Normal rate and regular rhythm.  Pulmonary:     Effort: Pulmonary effort is normal.     Breath sounds: Normal breath sounds.  Abdominal:     General: Abdomen is flat.  Musculoskeletal:        General: Normal range of motion.     Cervical back: Normal range of motion and neck supple.  Skin:    General: Skin is warm.  Neurological:     General: No focal deficit present.     Mental Status: He is alert.      LABORATORY DATA:  I have reviewed the data as listed Lab Results   Component Value Date   WBC 2.4 (L) 12/12/2022   HGB 13.9 12/12/2022   HCT 42.1 12/12/2022   MCV 85.6 12/12/2022   PLT 84 (L) 12/12/2022     Chemistry      Component Value Date/Time   NA 144 05/24/2022 1310   K 4.0 05/24/2022 1310   CL 106 05/24/2022 1310   CO2 21 05/24/2022 1310   BUN 23 05/24/2022 1310   CREATININE 1.25 05/24/2022 1310   CREATININE 1.18 12/06/2021 1117      Component Value Date/Time   CALCIUM 9.4 05/24/2022 1310   ALKPHOS 67 12/06/2021 1117   AST 25 12/06/2021 1117   ALT 25 12/06/2021 1117   BILITOT 0.5 12/06/2021 1117       RADIOGRAPHIC STUDIES: I have personally reviewed the radiological images as listed and agreed with the findings in the report. CUP PACEART REMOTE DEVICE CHECK  Result Date: 11/20/2022 Scheduled remote reviewed. Normal device function.  There was an eighteen second atrial arrhythmia Next remote 91 days and in clinic is 01/23/2023. ML, CVRS  Total time spent: 20 minutes including history, physical exam, review of records, counseling and coordination of care   Rachel Moulds, MD 12/12/2022 11:39 AM

## 2022-12-21 ENCOUNTER — Ambulatory Visit (INDEPENDENT_AMBULATORY_CARE_PROVIDER_SITE_OTHER): Payer: Medicare Other | Admitting: Podiatry

## 2022-12-21 ENCOUNTER — Encounter: Payer: Self-pay | Admitting: Podiatry

## 2022-12-21 DIAGNOSIS — B351 Tinea unguium: Secondary | ICD-10-CM

## 2022-12-21 DIAGNOSIS — M79674 Pain in right toe(s): Secondary | ICD-10-CM

## 2022-12-21 DIAGNOSIS — M79675 Pain in left toe(s): Secondary | ICD-10-CM

## 2022-12-21 DIAGNOSIS — E1159 Type 2 diabetes mellitus with other circulatory complications: Secondary | ICD-10-CM

## 2022-12-21 DIAGNOSIS — L600 Ingrowing nail: Secondary | ICD-10-CM

## 2022-12-21 NOTE — Progress Notes (Signed)
This patient returns to my office for at risk foot care.  This patient requires this care by a professional since this patient will be at risk due to having diabetes and thrombocytopenia.  This patient is unable to cut nails himself since the patient cannot reach his nails.These nails are painful walking and wearing shoes.  This patient presents for at risk foot care today.  General Appearance  Alert, conversant and in no acute stress.  Vascular  Dorsalis pedis and posterior tibial  pulses are palpable  bilaterally.  Capillary return is within normal limits  bilaterally. Temperature is within normal limits  bilaterally.  Neurologic  Senn-Weinstein monofilament wire test within normal limits  bilaterally. Muscle power within normal limits bilaterally.  Nails Thick disfigured discolored nails with subungual debris  from hallux to fifth toes bilaterally. No evidence of bacterial infection or drainage bilaterally. Painful lateral border left hallux.  Orthopedic  No limitations of motion  feet .  No crepitus or effusions noted.  No bony pathology or digital deformities noted.  Skin  normotropic skin with no porokeratosis noted bilaterally.  No signs of infections or ulcers noted.    Onychomycosis  Pain in right toes  Pain in left toes  Consent was obtained for treatment procedures.   Mechanical debridement of nails 1-5  bilaterally performed with a nail nipper.  Filed  with dremel tool.   Return office visit    10 weeks                 Told patient to return for periodic foot care and evaluation due to potential at risk complications.   Helane Gunther DPM

## 2023-01-23 ENCOUNTER — Encounter: Payer: Self-pay | Admitting: Internal Medicine

## 2023-01-23 ENCOUNTER — Ambulatory Visit: Payer: Medicare Other | Attending: Internal Medicine | Admitting: Internal Medicine

## 2023-01-23 VITALS — BP 112/66 | HR 66 | Ht 67.0 in | Wt 212.6 lb

## 2023-01-23 DIAGNOSIS — I5022 Chronic systolic (congestive) heart failure: Secondary | ICD-10-CM | POA: Diagnosis present

## 2023-01-23 DIAGNOSIS — Z95810 Presence of automatic (implantable) cardiac defibrillator: Secondary | ICD-10-CM | POA: Insufficient documentation

## 2023-01-23 LAB — CUP PACEART INCLINIC DEVICE CHECK
Battery Remaining Longevity: 24 mo
Brady Statistic RA Percent Paced: 38 %
Brady Statistic RV Percent Paced: 99.32 %
Date Time Interrogation Session: 20241210162457
HighPow Impedance: 74.25 Ohm
Implantable Lead Connection Status: 753985
Implantable Lead Connection Status: 753985
Implantable Lead Connection Status: 753985
Implantable Lead Implant Date: 20181115
Implantable Lead Implant Date: 20181115
Implantable Lead Implant Date: 20181115
Implantable Lead Location: 753858
Implantable Lead Location: 753859
Implantable Lead Location: 753860
Implantable Lead Model: 7122
Implantable Pulse Generator Implant Date: 20181115
Lead Channel Impedance Value: 450 Ohm
Lead Channel Impedance Value: 462.5 Ohm
Lead Channel Impedance Value: 637.5 Ohm
Lead Channel Pacing Threshold Amplitude: 0.5 V
Lead Channel Pacing Threshold Amplitude: 0.5 V
Lead Channel Pacing Threshold Amplitude: 0.75 V
Lead Channel Pacing Threshold Amplitude: 0.75 V
Lead Channel Pacing Threshold Amplitude: 0.75 V
Lead Channel Pacing Threshold Amplitude: 0.75 V
Lead Channel Pacing Threshold Pulse Width: 0.5 ms
Lead Channel Pacing Threshold Pulse Width: 0.5 ms
Lead Channel Pacing Threshold Pulse Width: 0.5 ms
Lead Channel Pacing Threshold Pulse Width: 0.5 ms
Lead Channel Pacing Threshold Pulse Width: 0.5 ms
Lead Channel Pacing Threshold Pulse Width: 0.5 ms
Lead Channel Sensing Intrinsic Amplitude: 11.7 mV
Lead Channel Sensing Intrinsic Amplitude: 3.7 mV
Lead Channel Setting Pacing Amplitude: 1.5 V
Lead Channel Setting Pacing Amplitude: 2 V
Lead Channel Setting Pacing Amplitude: 2 V
Lead Channel Setting Pacing Pulse Width: 0.5 ms
Lead Channel Setting Pacing Pulse Width: 0.5 ms
Lead Channel Setting Sensing Sensitivity: 0.5 mV
Pulse Gen Serial Number: 9780956
Zone Setting Status: 755011

## 2023-01-23 NOTE — Patient Instructions (Signed)
Medication Instructions:  Your physician recommends that you continue on your current medications as directed. Please refer to the Current Medication list given to you today.  *If you need a refill on your cardiac medications before your next appointment, please call your pharmacy*  Lab Work: None ordered.  If you have labs (blood work) drawn today and your tests are completely normal, you will receive your results only by: MyChart Message (if you have MyChart) OR A paper copy in the mail If you have any lab test that is abnormal or we need to change your treatment, we will call you to review the results.  Testing/Procedures: None ordered.  Follow-Up: At [facility], you and your health needs are our priority.  As part of our continuing mission to provide you with exceptional heart care, we have created designated Provider Care Teams.  These Care Teams include your primary Cardiologist (physician) and Advanced Practice Providers (APPs -  Physician Assistants and Nurse Practitioners) who all work together to provide you with the care you need, when you need it.   Your next appointment:   1 year(s)  The format for your next appointment:   In Person  Provider:   Dr. Virl Son one of the following Advanced Practice Providers on your designated Care Team:   Francis Dowse, [place] Casimiro Needle "Mardelle Matte" [place], [place] Earnest Rosier, NP  Remote monitoring is used to monitor your Pacemaker/ ICD from home. This monitoring reduces the number of office visits required to check your device to one time per year. It allows [place] to keep an eye on the functioning of your device to ensure it is working properly.   Important Information About Sugar

## 2023-01-23 NOTE — Progress Notes (Signed)
HPI [name] returns today for ongoing followup. He has chronic systolic heart failure, s/p MI, LBBB, s/p Biv ICD insertion. He denies chest pain or sob. He has class 2 symptoms. He denies dietary indiscretion. He admits to being sedentary. He admits to dietary indiscretion. He has not been in the hospital. He admits to being sedentary. Allergies  Allergen Reactions   Metformin Hcl Diarrhea   Procaine Other (See Comments)    Other reaction(s): not effective   Rosuvastatin     Foot pain and hip pain   Spironolactone Nausea Only   Tylenol With Codeine #3 [Acetaminophen-Codeine] Nausea Only    Per pt can take tylenol no issues     Current Outpatient Medications  Medication Sig Dispense Refill   aspirin EC 81 MG tablet Take 1 tablet (81 mg total) by mouth daily. 30 tablet 11   atorvastatin (LIPITOR) 40 MG tablet Take 40 mg by mouth daily at 6 PM.     B Complex CAPS Take 1 capsule by mouth daily.     calcium carbonate (OSCAL) 1500 (600 Ca) MG TABS tablet Take 600 mg by mouth 2 (two) times daily.     carvedilol (COREG) 25 MG tablet TAKE 1 TABLET BY MOUTH TWICE A DAY 180 tablet 3   cetirizine (ZYRTEC) 10 MG tablet Take 10 mg by mouth at bedtime.     empagliflozin (JARDIANCE) 25 MG TABS tablet Take 1 tablet by mouth daily.     famotidine (PEPCID) 40 MG tablet Take 1 tablet by mouth at bedtime.     fenofibrate micronized (LOFIBRA) 200 MG capsule Take 200 mg by mouth daily before breakfast.     fluticasone (FLONASE) 50 MCG/ACT nasal spray 2 sprays at bedtime.     glimepiride (AMARYL) 1 MG tablet Take 1 mg by mouth at bedtime.     lisinopril (ZESTRIL) 40 MG tablet TAKE 1 TABLET BY MOUTH EVERY DAY 90 tablet 3   Misc Natural Products (GLUCOSAMINE-CHONDROITIN PLUS) TABS Take 1 tablet by mouth 2 (two) times daily.     Multiple Vitamins-Minerals (ONE-A-DAY MENS 50+) TABS Take by mouth daily.     naproxen sodium (ALEVE) 220 MG tablet Take 220 mg by mouth daily as needed (pain).      nitroGLYCERIN (NITROSTAT) 0.4 MG SL tablet Place 1 tablet (0.4 mg total) under the tongue every 5 (five) minutes as needed for chest pain. 25 tablet 3   OMEGA 3 1000 MG CAPS Take 1 capsule by mouth daily.     potassium chloride SA (K-DUR,KLOR-CON) 20 MEQ tablet Take 20 mEq by mouth 2 (two) times daily.      saccharomyces boulardii (FLORASTOR) 250 MG capsule 2 (two) times daily.     sodium chloride (OCEAN) 0.65 % SOLN nasal spray Place 2 sprays into both nostrils in the morning and at bedtime.  0   tamsulosin (FLOMAX) 0.4 MG CAPS capsule daily at 6 (six) AM.     torsemide (DEMADEX) 20 MG tablet Take 1 tablet (20 mg total) by mouth daily. 90 tablet 3   vitamin C (ASCORBIC ACID) 500 MG tablet Take 500 mg 2 (two) times daily by mouth.      No current facility-administered medications for this visit.     Past Medical History:  Diagnosis Date   AICD (automatic cardioverter/defibrillator) present 12/28/2016   St Jude placed by dr g. Terron Merfeld for ICM/ CHF   Allergic rhinitis, seasonal    Benign localized prostatic hyperplasia with lower urinary tract  symptoms (LUTS)    CAD (coronary artery disease)    primary cardiology--- dr h. Katrinka Blazing;  a. 2014 CABG x 4 (L->LAD, V->Diag, V->OM, Rad->RCA);  b. 03/2013 Cath: LM nl, LAD 80p, LCX 56m, OM1 100, RCA 70-84m, all grafts patent, EF 40%.   Chronic low back pain    Chronic systolic CHF (congestive heart failure) (HCC)    followed by cardiology   CKD (chronic kidney disease), stage III (HCC)    Constipation, chronic    Deviated nasal septum    followed by dr Annalee Genta---  severe   Diverticulosis of colon    Family history of adverse reaction to anesthesia    sister--- ponv   GERD (gastroesophageal reflux disease)    History of colon polyps    History of kidney stones    Hypertension    followed by cardiology and pcp   Infection of great toe 04/12/2021   left ,  per pt due to ingrown toe nail   Ischemic cardiomyopathy    followed by dr h. Katrinka Blazing;;  a.  03/2013 Echo: EF 25-30%, diff HK, antsep/apical AK, Gr2 DD, Ao sclerosis, mild AI, PASP 36.;    last echo in epic 02-02-2020 , ef 60-65% with mild septal/ inferior hypokinesis G1DD, mild AR no stenosis   Left bundle branch block    Lipoma of back    Malignant neoplasm prostate [facility])    urologist-- dr newsome/  radiation oncology-- dr Kathrynn Running;  dx 10/ 2022,  Gleason 4=%, PSA 7.84   Mild aortic valve regurgitation    per last echo in epic 01-2020  w/ mild calcification/ thickening but no sclerosis / stenosis   Mixed hyperlipidemia    takes Lipitor daily   OA (osteoarthritis)    hands   OSA (obstructive sleep apnea)    "dx'd; having another sleep study later this month; never followed up on getting a mask" (12/28/2016)   OSA on CPAP    followed by dr t. turner--- per study in epic 01-11-2017, Moderate OSA ( AHI of 23.1/hr with O2 desats as low as 81%.   Peripheral edema    Peripheral neuropathy    Polycythemia, secondary    followed by oncologist-- dr Rod Holler,  per last note in epic 02-03-2021 secondary to OSA and noncompliant with cpap   S/P CABG x 4 08/23/2012   LIMA-- LAD,  SVG --Diag,  SVG -- OM,  rad -- RCA   Thrombocytopenia (HCC)    followed by oncology/ hematology--- dr Demetrius Charity. [place] ([facility]),  hx phlebotomy   Type II diabetes mellitus (HCC)    followed by pcp    (04-14-2021  pt stated checks blood sugar daily in am,  fasting sugar-- 90-150)   Wears hearing aid in both ears     ROS:   All systems reviewed and negative except as noted in the HPI.   Past Surgical History:  Procedure Laterality Date   BIV ICD INSERTION CRT-D N/A 12/28/2016   Procedure: BIV ICD INSERTION CRT-D;  Surgeon: Marinus Maw, MD;  Location: [facility] INVASIVE CV LAB;  Service: Cardiovascular;  Laterality: N/A;   COLONOSCOPY     CORONARY ARTERY BYPASS GRAFT N/A 08/23/2012   Procedure: CORONARY ARTERY BYPASS GRAFTING times four using Right Greater Saphenous Vein Graft harvested endoscopically and  Left Radail Artery Graft;  Surgeon: Kerin Perna, MD;  Location: [facility] OR;  Service: Open Heart Surgery;  Laterality: N/A;   GOLD SEED IMPLANT N/A 04/19/2021   Procedure: GOLD  SEED IMPLANT;  Surgeon: Belva Agee, MD;  Location: [facility];  Service: Urology;  Laterality: N/A;  30 MINS FOR THIS CASE   INTRAOPERATIVE TRANSESOPHAGEAL ECHOCARDIOGRAM N/A 08/23/2012   Procedure: INTRAOPERATIVE TRANSESOPHAGEAL ECHOCARDIOGRAM;  Surgeon: Kerin Perna, MD;  Location: [facility] OR;  Service: Open Heart Surgery;  Laterality: N/A;   KNEE ARTHROSCOPY Right 2013   LEFT HEART CATHETERIZATION WITH CORONARY ANGIOGRAM N/A 08/15/2012   Procedure: LEFT HEART CATHETERIZATION WITH CORONARY ANGIOGRAM;  Surgeon: Lesleigh Noe, MD;  Location: [facility] CATH LAB;  Service: Cardiovascular;  Laterality: N/A;   LEFT HEART CATHETERIZATION WITH CORONARY ANGIOGRAM N/A 04/04/2013   Procedure: LEFT HEART CATHETERIZATION WITH CORONARY ANGIOGRAM;  Surgeon: Lennette Bihari, MD;  Location: [facility] CATH LAB;  Service: Cardiovascular;  Laterality: N/A;   ORIF TIBIA PLATEAU Left 10/16/2008   @ [facility];   AND ORIF LEFT HUMERUS SHAFT   RADIAL ARTERY HARVEST Left 08/23/2012   Procedure: RADIAL ARTERY HARVEST;  Surgeon: Kerin Perna, MD;  Location: [facility] OR;  Service: Open Heart Surgery;  Laterality: Left;   SPACE OAR INSTILLATION N/A 04/19/2021   Procedure: SPACE OAR INSTILLATION;  Surgeon: Belva Agee, MD;  Location: [facility];  Service: Urology;  Laterality: N/A;     Family History  Problem Relation Age of Onset   Hypertension Mother    Hyperlipidemia Mother    Cancer Mother        uterine   Anemia Mother    Fibromyalgia Sister    Heart attack Paternal Uncle    Stroke Paternal Aunt      Social History   Socioeconomic History   Marital status: Widowed    Spouse name: Not on file   Number of children: Not on file   Years of education: Not on file   Highest education level: Not on file  Occupational  History   Occupation: Surveyor, 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, fire and burglar alarms    Tobacco Use   Smoking status: Never   Smokeless tobacco: Never  Vaping Use   Vaping status: Never Used  Substance and Sexual Activity   Alcohol use: Yes    Comment: seldom   Drug use: No   Sexual activity: Not on file  Other Topics Concern   Not on file  Social History Narrative   Not on file   Social Determinants of Health   Financial Resource Strain: Not on file  Food Insecurity: Not on file  Transportation Needs: Not on file  Physical Activity: Not on file  Stress: Not on file  Social Connections: Not on file  Intimate Partner Violence: Not on file     BP 112/66   Pulse 66   Ht 5\' 7"  (1.702 m)   Wt 212 lb 9.6 oz (96.4 kg)   SpO2 97%   BMI 33.30 kg/m   Physical Exam:  Well appearing NAD HEENT: Unremarkable Neck:  No JVD, no thyromegally Lymphatics:  No adenopathy Back:  No CVA tenderness Lungs:  Clear HEART:  Regular rate rhythm, no murmurs, no rubs, no clicks Abd:  soft, positive bowel sounds, no organomegally, no rebound, no guarding Ext:  2 plus pulses, no edema, no cyanosis, no clubbing Skin:  No rashes no nodules Neuro:  CN II through XII intact, motor grossly intact  EKG - nsr with biv pacing  DEVICE  Normal device function.  See PaceArt for details.   Assess/Plan:  ICD - his St. Jude  biv ICD is working normally. We will follow. Chronic systolic heart failure - his symptoms are class 2. He will continue his current meds. CAD - he denies anginal symptoms. He will continue his current meds. Obesity - I have encouraged the patient to lose weight.   Sharlot Gowda Saphia Vanderford,MD

## 2023-02-19 ENCOUNTER — Ambulatory Visit: Payer: Medicare Other

## 2023-02-19 DIAGNOSIS — I255 Ischemic cardiomyopathy: Secondary | ICD-10-CM | POA: Diagnosis not present

## 2023-02-19 DIAGNOSIS — I5022 Chronic systolic (congestive) heart failure: Secondary | ICD-10-CM

## 2023-02-20 LAB — CUP PACEART REMOTE DEVICE CHECK
Battery Remaining Longevity: 26 mo
Battery Remaining Percentage: 29 %
Battery Voltage: 2.87 V
Brady Statistic AP VP Percent: 55 %
Brady Statistic AP VS Percent: 1.5 %
Brady Statistic AS VP Percent: 44 %
Brady Statistic AS VS Percent: 1 %
Brady Statistic RA Percent Paced: 56 %
Date Time Interrogation Session: 20250106020023
HighPow Impedance: 78 Ohm
HighPow Impedance: 78 Ohm
Implantable Lead Connection Status: 753985
Implantable Lead Connection Status: 753985
Implantable Lead Connection Status: 753985
Implantable Lead Implant Date: 20181115
Implantable Lead Implant Date: 20181115
Implantable Lead Implant Date: 20181115
Implantable Lead Location: 753858
Implantable Lead Location: 753859
Implantable Lead Location: 753860
Implantable Lead Model: 7122
Implantable Pulse Generator Implant Date: 20181115
Lead Channel Impedance Value: 440 Ohm
Lead Channel Impedance Value: 460 Ohm
Lead Channel Impedance Value: 640 Ohm
Lead Channel Pacing Threshold Amplitude: 0.5 V
Lead Channel Pacing Threshold Amplitude: 0.625 V
Lead Channel Pacing Threshold Amplitude: 0.75 V
Lead Channel Pacing Threshold Pulse Width: 0.5 ms
Lead Channel Pacing Threshold Pulse Width: 0.5 ms
Lead Channel Pacing Threshold Pulse Width: 0.5 ms
Lead Channel Sensing Intrinsic Amplitude: 11.7 mV
Lead Channel Sensing Intrinsic Amplitude: 3.5 mV
Lead Channel Setting Pacing Amplitude: 1.5 V
Lead Channel Setting Pacing Amplitude: 2 V
Lead Channel Setting Pacing Amplitude: 2 V
Lead Channel Setting Pacing Pulse Width: 0.5 ms
Lead Channel Setting Pacing Pulse Width: 0.5 ms
Lead Channel Setting Sensing Sensitivity: 0.5 mV
Pulse Gen Serial Number: 9780956
Zone Setting Status: 755011

## 2023-03-01 ENCOUNTER — Encounter: Payer: Self-pay | Admitting: Podiatry

## 2023-03-01 ENCOUNTER — Ambulatory Visit: Payer: Medicare Other | Admitting: Podiatry

## 2023-03-01 DIAGNOSIS — M79674 Pain in right toe(s): Secondary | ICD-10-CM

## 2023-03-01 DIAGNOSIS — M79675 Pain in left toe(s): Secondary | ICD-10-CM

## 2023-03-01 DIAGNOSIS — E1159 Type 2 diabetes mellitus with other circulatory complications: Secondary | ICD-10-CM

## 2023-03-01 DIAGNOSIS — B351 Tinea unguium: Secondary | ICD-10-CM | POA: Diagnosis not present

## 2023-03-01 NOTE — Progress Notes (Signed)
This patient returns to my office for at risk foot care.  This patient requires this care by a professional since this patient will be at risk due to having diabetes and thrombocytopenia.  This patient is unable to cut nails himself since the patient cannot reach his nails.These nails are painful walking and wearing shoes.  This patient presents for at risk foot care today.  General Appearance  Alert, conversant and in no acute stress.  Vascular  Dorsalis pedis and posterior tibial  pulses are palpable  bilaterally.  Capillary return is within normal limits  bilaterally. Temperature is within normal limits  bilaterally.  Neurologic  Senn-Weinstein monofilament wire test within normal limits  bilaterally. Muscle power within normal limits bilaterally.  Nails Thick disfigured discolored nails with subungual debris  from hallux to fifth toes bilaterally. No evidence of bacterial infection or drainage bilaterally.   Orthopedic  No limitations of motion  feet .  No crepitus or effusions noted.  No bony pathology or digital deformities noted.  Skin  normotropic skin with no porokeratosis noted bilaterally.  No signs of infections or ulcers noted.    Onychomycosis  Pain in right toes  Pain in left toes  Consent was obtained for treatment procedures.   Mechanical debridement of nails 1-5  bilaterally performed with a nail nipper.  Filed  with dremel tool.   Return office visit    10 weeks                 Told patient to return for periodic foot care and evaluation due to potential at risk complications.   Helane Gunther DPM

## 2023-03-30 NOTE — Addendum Note (Signed)
Addended by: Geralyn Flash D on: 03/30/2023 04:14 PM   Modules accepted: Orders

## 2023-03-30 NOTE — Progress Notes (Signed)
Remote ICD transmission.

## 2023-05-07 IMAGING — CT NM PET TUM IMG SKULL BASE T - THIGH
1 of 8 series · 1 of 25 positions shown · non-contrast
Comparison: 10/16/2008 chest abdomen and pelvic CTs.

CLINICAL DATA: New diagnosis of prostate cancer, status post biopsy
2 weeks ago. PSA of 7.8.

EXAM:
NUCLEAR MEDICINE PET SKULL BASE TO THIGH
TECHNIQUE: 9.3 mCi F18 Piflufolastat (Pylarify) was injected intravenously.
Full-ring PET imaging was performed from the skull base to thigh
after the radiotracer. CT data was obtained and used for attenuation
correction and anatomic localization.

[Series 14: pet sk_thigh ac · axial · 5.0mm · 4.07mm/px · 1 of 235 slices shown]
[im 118/235]
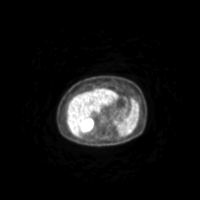

[1 of 25 positions shown; findings below may reference images not displayed]

FINDINGS: NECK

No radiotracer activity in neck lymph nodes.

Incidental CT finding: No cervical adenopathy.

CHEST

No radiotracer accumulation within mediastinal or hilar lymph nodes.

Incidental CT finding: Pacer/AICD device. Median sternotomy for
prior CABG. Cardiomegaly. Aortic atherosclerosis.

Right upper lobe calcified granuloma on [DATE].

2 mm right upper lobe pulmonary nodule on [DATE].

2 mm right lower lobe pulmonary nodule on 58/10. Present on the
remote CT.

3 mm right lower lobe pulmonary nodule on 49/10. present on the
remote CT.

ABDOMEN/PELVIS

Prostate: Mild heterogeneous tracer affinity within the prostate
including at a S.U.V. max of 3.9.

Lymph nodes: No abnormal radiotracer accumulation within pelvic or
abdominal nodes.

Liver: No evidence of liver metastasis

Incidental CT finding: Normal adrenal glands. Dependent gallstones.
Punctate bilateral renal collecting system calculi. Lower pole right
renal low-density lesion of 2.1 cm is likely a cyst. No bladder
calculi. Scattered colonic diverticula. Mild to moderate
prostatomegaly. Small bilateral fat containing inguinal hernias.

SKELETON

No focal  activity to suggest skeletal metastasis.

Bilateral hip osteoarthritis. Degenerative partial fusion of
bilateral sacroiliac joints.
IMPRESSION: 1. No tracer avid metastatic disease.
2. Nonspecific heterogeneous tracer affinity throughout the
prostate.
3. Tiny pulmonary nodules, primarily present on remote CT. Most
consistent with an infectious or inflammatory etiology.
4. Incidental findings, including: Aortic Atherosclerosis
([id number]). Cholelithiasis. Bilateral nephrolithiasis.

## 2023-05-11 ENCOUNTER — Ambulatory Visit: Payer: Medicare Other | Attending: Internal Medicine | Admitting: Internal Medicine

## 2023-05-11 VITALS — BP 108/56 | HR 66 | Ht 66.5 in | Wt 214.0 lb

## 2023-05-11 DIAGNOSIS — Z951 Presence of aortocoronary bypass graft: Secondary | ICD-10-CM

## 2023-05-11 DIAGNOSIS — Z95810 Presence of automatic (implantable) cardiac defibrillator: Secondary | ICD-10-CM

## 2023-05-11 DIAGNOSIS — I5023 Acute on chronic systolic (congestive) heart failure: Secondary | ICD-10-CM | POA: Diagnosis not present

## 2023-05-11 DIAGNOSIS — I10 Essential (primary) hypertension: Secondary | ICD-10-CM

## 2023-05-11 DIAGNOSIS — I359 Nonrheumatic aortic valve disorder, unspecified: Secondary | ICD-10-CM

## 2023-05-11 DIAGNOSIS — I2510 Atherosclerotic heart disease of native coronary artery without angina pectoris: Secondary | ICD-10-CM

## 2023-05-11 MED ORDER — TORSEMIDE 20 MG PO TABS: 20.0000 mg | ORAL_TABLET | Freq: Every day | ORAL | 3 refills | Status: DC

## 2023-05-11 NOTE — Patient Instructions (Signed)
 Medication Instructions:  Your physician has recommended you make the following change in your medication:  TAKE:  torsemide 20 mg by mouth once daily as needed for swelling/ shortness of breath  TAKE: Potassium 20 mEq by mouth as needed when you take an extra torsemide  *If you need a refill on your cardiac medications before your next appointment, please call your pharmacy*  Lab Work: NONE If you have labs (blood work) drawn today and your tests are completely normal, you will receive your results only by: MyChart Message (if you have MyChart) OR A paper copy in the mail If you have any lab test that is abnormal or we need to change your treatment, we will call you to review the results.  Testing/Procedures: MARCH- - - 2026   Your physician has requested that you have an echocardiogram. Echocardiography is a painless test that uses sound waves to create images of your heart. It provides your doctor with information about the size and shape of your heart and how well your heart's chambers and valves are working. This procedure takes approximately one hour. There are no restrictions for this procedure. Please do NOT wear cologne, perfume, aftershave, or lotions (deodorant is allowed). Please arrive 15 minutes prior to your appointment time.  Please note: We ask at that you not bring children with you during ultrasound (echo/ vascular) testing. Due to room size and safety concerns, children are not allowed in the ultrasound rooms during exams. Our front office staff cannot provide observation of children in our lobby area while testing is being conducted. An adult accompanying a patient to their appointment will only be allowed in the ultrasound room at the discretion of the ultrasound technician under special circumstances. We apologize for any inconvenience.   Follow-Up: At [facility], you and your health needs are our priority.  As part of our continuing mission to provide you  with exceptional heart care, our providers are all part of one team.  This team includes your primary Cardiologist (physician) and Advanced Practice Providers or APPs (Physician Assistants and Nurse Practitioners) who all work together to provide you with the care you need, when you need it.  Your next appointment:   1 year(s)  Provider:   Christell Constant, MD      Other Instructions       1st Floor: - Lobby - Registration  - Pharmacy  - Lab - Cafe  2nd Floor: - PV Lab - Diagnostic Testing (echo, CT, nuclear med)  3rd Floor: - Vacant  4th Floor: - TCTS (cardiothoracic surgery) - AFib Clinic - Structural Heart Clinic - Vascular Surgery  - Vascular Ultrasound  5th Floor: - [facility] (general and EP) - Clinical Pharmacy for coumadin, hypertension, lipid, weight-loss medications, and med management appointments    Valet parking services will be available as well.

## 2023-05-11 NOTE — Progress Notes (Signed)
 Cardiology Office Note:    Date:  05/11/2023   ID:  Kenneth Giles, DOB November 11, 1950, MRN [id number]  PCP:  Merlene Laughter, MD (Inactive)  Cardiologist:  Christell Constant, MD   Referring MD: No ref. provider found   CC: HF f/u  History of Present Illness:    Kenneth Giles is a 73 y.o. male with a hx of CAD with CABG (x4 with LIMA to LAD; SV to Diag; SV to OM; Free radial RCA), chronic systolic heart failure (ischemic), BiV ICD, LBBB (seen by Dr. Ladona Ridgel), and aortic valve disease. 2024: since last f/u is s/p ICD.    Kenneth Giles "Brett Canales" is a 73 year old male with coronary artery disease and chronic systolic heart failure who presents for a routine follow-up visit.  He has a history of coronary artery disease and chronic systolic heart failure, with a biventricular cardioverter-defibrillator (BiV ICD) and a left bundle branch block. His last echocardiogram, performed a year ago, showed recovery of heart function. He experiences fatigue primarily with excessive exertion, though this is rare, and has not needed nitroglycerin recently.  He notes that his leg swelling is about the same as before. There is some weight fluctuation, which he attributes to possible fluid retention. He acknowledges having some fluid retention and a heart murmur.  During the review of symptoms, his hips start hurting when walking, with the severity varying from minor to very bad.  He is currently on a regimen that includes aspirin, atorvastatin, carvedilol, an ACE inhibitor, and an SGLT2 inhibitor. He takes torsemide 20 mg orally daily and potassium supplements. He has an allergy to spironolactone.  Socially, he is planning trips to [place] and [place] for three-wheel motorcycle rallies, indicating a level of physical activity and engagement in social events.  Past Medical History:  Diagnosis Date   AICD (automatic cardioverter/defibrillator) present 12/28/2016   St Jude placed by dr g.  taylor for ICM/ CHF   Allergic rhinitis, seasonal    Benign localized prostatic hyperplasia with lower urinary tract symptoms (LUTS)    CAD (coronary artery disease)    primary cardiology--- dr h. Katrinka Blazing;  a. 2014 CABG x 4 (L->LAD, V->Diag, V->OM, Rad->RCA);  b. 03/2013 Cath: LM nl, LAD 80p, LCX 57m, OM1 100, RCA 70-30m, all grafts patent, EF 40%.   Chronic low back pain    Chronic systolic CHF (congestive heart failure) (HCC)    followed by cardiology   CKD (chronic kidney disease), stage III (HCC)    Constipation, chronic    Deviated nasal septum    followed by dr Annalee Genta---  severe   Diverticulosis of colon    Family history of adverse reaction to anesthesia    sister--- ponv   GERD (gastroesophageal reflux disease)    History of colon polyps    History of kidney stones    Hypertension    followed by cardiology and pcp   Infection of great toe 04/12/2021   left ,  per pt due to ingrown toe nail   Ischemic cardiomyopathy    followed by dr h. Katrinka Blazing;;  a. 03/2013 Echo: EF 25-30%, diff HK, antsep/apical AK, Gr2 DD, Ao sclerosis, mild AI, PASP 36.;    last echo in epic 02-02-2020 , ef 60-65% with mild septal/ inferior hypokinesis G1DD, mild AR no stenosis   Left bundle branch block    Lipoma of back    Malignant neoplasm prostate [facility])    urologist-- dr newsome/  radiation oncology-- dr Kathrynn Running;  dx 10/ 2022,  Gleason 4=%, PSA 7.84   Mild aortic valve regurgitation    per last echo in epic 01-2020  w/ mild calcification/ thickening but no sclerosis / stenosis   Mixed hyperlipidemia    takes Lipitor daily   OA (osteoarthritis)    hands   OSA (obstructive sleep apnea)    "dx'd; having another sleep study later this month; never followed up on getting a mask" (12/28/2016)   OSA on CPAP    followed by dr t. turner--- per study in epic 01-11-2017, Moderate OSA ( AHI of 23.1/hr with O2 desats as low as 81%.   Peripheral edema    Peripheral neuropathy    Polycythemia, secondary     followed by oncologist-- dr Rod Holler,  per last note in epic 02-03-2021 secondary to OSA and noncompliant with cpap   S/P CABG x 4 08/23/2012   LIMA-- LAD,  SVG --Diag,  SVG -- OM,  rad -- RCA   Thrombocytopenia (HCC)    followed by oncology/ hematology--- dr Demetrius Charity. [place] ([facility]),  hx phlebotomy   Type II diabetes mellitus (HCC)    followed by pcp    (04-14-2021  pt stated checks blood sugar daily in am,  fasting sugar-- 90-150)   Wears hearing aid in both ears     Past Surgical History:  Procedure Laterality Date   BIV ICD INSERTION CRT-D N/A 12/28/2016   Procedure: BIV ICD INSERTION CRT-D;  Surgeon: Marinus Maw, MD;  Location: [facility] INVASIVE CV LAB;  Service: Cardiovascular;  Laterality: N/A;   COLONOSCOPY     CORONARY ARTERY BYPASS GRAFT N/A 08/23/2012   Procedure: CORONARY ARTERY BYPASS GRAFTING times four using Right Greater Saphenous Vein Graft harvested endoscopically and Left Radail Artery Graft;  Surgeon: Kerin Perna, MD;  Location: [facility] OR;  Service: Open Heart Surgery;  Laterality: N/A;   GOLD SEED IMPLANT N/A 04/19/2021   Procedure: GOLD SEED IMPLANT;  Surgeon: Belva Agee, MD;  Location: [facility];  Service: Urology;  Laterality: N/A;  30 MINS FOR THIS CASE   INTRAOPERATIVE TRANSESOPHAGEAL ECHOCARDIOGRAM N/A 08/23/2012   Procedure: INTRAOPERATIVE TRANSESOPHAGEAL ECHOCARDIOGRAM;  Surgeon: Kerin Perna, MD;  Location: [facility] OR;  Service: Open Heart Surgery;  Laterality: N/A;   KNEE ARTHROSCOPY Right 2013   LEFT HEART CATHETERIZATION WITH CORONARY ANGIOGRAM N/A 08/15/2012   Procedure: LEFT HEART CATHETERIZATION WITH CORONARY ANGIOGRAM;  Surgeon: Lesleigh Noe, MD;  Location: [facility] CATH LAB;  Service: Cardiovascular;  Laterality: N/A;   LEFT HEART CATHETERIZATION WITH CORONARY ANGIOGRAM N/A 04/04/2013   Procedure: LEFT HEART CATHETERIZATION WITH CORONARY ANGIOGRAM;  Surgeon: Lennette Bihari, MD;  Location: [facility] CATH LAB;  Service: Cardiovascular;   Laterality: N/A;   ORIF TIBIA PLATEAU Left 10/16/2008   @ [facility];   AND ORIF LEFT HUMERUS SHAFT   RADIAL ARTERY HARVEST Left 08/23/2012   Procedure: RADIAL ARTERY HARVEST;  Surgeon: Kerin Perna, MD;  Location: [facility] OR;  Service: Open Heart Surgery;  Laterality: Left;   SPACE OAR INSTILLATION N/A 04/19/2021   Procedure: SPACE OAR INSTILLATION;  Surgeon: Belva Agee, MD;  Location: [facility];  Service: Urology;  Laterality: N/A;    Current Medications: Current Meds  Medication Sig   aspirin EC 81 MG tablet Take 1 tablet (81 mg total) by mouth daily.   atorvastatin (LIPITOR) 40 MG tablet Take 40 mg by mouth daily at 6 PM.   B Complex CAPS Take 1 capsule  by mouth daily.   carvedilol (COREG) 25 MG tablet TAKE 1 TABLET BY MOUTH TWICE A DAY   cetirizine (ZYRTEC) 10 MG tablet Take 10 mg by mouth at bedtime.   empagliflozin (JARDIANCE) 25 MG TABS tablet Take 1 tablet by mouth daily.   famotidine (PEPCID) 40 MG tablet Take 1 tablet by mouth at bedtime.   fenofibrate micronized (LOFIBRA) 200 MG capsule Take 200 mg by mouth daily before breakfast.   fluticasone (FLONASE) 50 MCG/ACT nasal spray 2 sprays at bedtime.   glimepiride (AMARYL) 1 MG tablet Take 1 mg by mouth at bedtime.   lisinopril (ZESTRIL) 40 MG tablet TAKE 1 TABLET BY MOUTH EVERY DAY   Misc Natural Products (GLUCOSAMINE-CHONDROITIN PLUS) TABS Take 1 tablet by mouth 2 (two) times daily.   Multiple Vitamins-Minerals (ONE-A-DAY MENS 50+) TABS Take by mouth daily.   naproxen sodium (ALEVE) 220 MG tablet Take 220 mg by mouth daily as needed (pain).   nitroGLYCERIN (NITROSTAT) 0.4 MG SL tablet Place 1 tablet (0.4 mg total) under the tongue every 5 (five) minutes as needed for chest pain.   OMEGA 3 1000 MG CAPS Take 1 capsule by mouth daily.   potassium chloride SA (K-DUR,KLOR-CON) 20 MEQ tablet Take 20 mEq by mouth 2 (two) times daily. Take an extra dose daily when take an extra dose of torsemide   saccharomyces boulardii  (FLORASTOR) 250 MG capsule 2 (two) times daily.   sodium chloride (OCEAN) 0.65 % SOLN nasal spray Place 2 sprays into both nostrils in the morning and at bedtime.   vitamin C (ASCORBIC ACID) 500 MG tablet Take 500 mg 2 (two) times daily by mouth.    [DISCONTINUED] torsemide (DEMADEX) 20 MG tablet Take 1 tablet (20 mg total) by mouth daily.     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, fire and burglar alarms    Tobacco Use   Smoking status: Never   Smokeless tobacco: Never  Vaping Use   Vaping status: Never Used  Substance and Sexual Activity   Alcohol use: Yes    Comment: seldom   Drug use: No   Sexual activity: Not on file  Other Topics Concern   Not on file  Social History Narrative   Not on file   Social Drivers of Health   Financial Resource Strain: Not on file  Food Insecurity: Not on file  Transportation Needs: Not on file  Physical Activity: Not on file  Stress: Not on file  Social Connections: Not on file     Family History: The patient's family history includes Anemia in his mother; Cancer in his mother; Fibromyalgia in his sister; Heart attack in his paternal uncle; Hyperlipidemia in his mother; Hypertension in his mother; Stroke in his paternal aunt.  ROS:   Please see the history of present illness.      EKGs/Labs/Other Studies Reviewed:    The following studies were reviewed today:   EKG:   05/09/22: SR Biv Pacing rate 60  Cardiac Studies & Procedures   ______________________________________________________________________________________________     ECHOCARDIOGRAM  ECHOCARDIOGRAM  COMPLETE 06/14/2022  Narrative ECHOCARDIOGRAM REPORT  Patient Name:   Kenneth Giles [facility] Date of Exam: 06/14/2022 Medical Rec #:  [id number]         Height:       65.0 in Accession #:    [id number]        Weight:       208.4 lb Date of Birth:  1950/07/12          BSA:          2.013 m Patient Age:    71 years          BP:           109/88 mmHg Patient Gender: M                 HR:           62 bpm. Exam Location:  [address]  Procedure: 2D Echo, Cardiac Doppler, Color Doppler and Intracardiac Opacification Agent  Indications:    I50.22 Chronic systolic heart failure  History:        Patient has prior history of Echocardiogram examinations, most recent 02/02/2020. Prior CABG and Defibrillator; Risk Factors:Hypertension and Diabetes. Obstructive sleep apnea-CPAP. Chronic systolic heart failure. Ischemic cardiomyopathy.  Sonographer:    Daphine Deutscher RDCS Referring Phys: [id number] [facility] Dezyre Hoefer  IMPRESSIONS   1. Left ventricular ejection fraction, by estimation, is 55 to 60%. The left ventricle has normal function. The left ventricle demonstrates regional wall motion abnormalities. Apical hypokinesis. Left ventricular diastolic parameters were normal. 2. Right ventricular systolic function is normal. The right ventricular size is normal. Tricuspid regurgitation signal is inadequate for assessing PA pressure. 3. The mitral valve is normal in structure. No evidence of mitral valve regurgitation. No evidence of mitral stenosis. 4. The aortic valve is tricuspid. Aortic valve regurgitation is trivial. Aortic valve sclerosis/calcification is present, without any evidence of aortic stenosis. 5. The inferior vena cava is normal in size with greater than 50% respiratory variability, suggesting right atrial pressure of 3 mmHg.  FINDINGS Left Ventricle: Left ventricular ejection fraction, by estimation, is 55 to 60%. The left ventricle has normal function. The left ventricle  demonstrates regional wall motion abnormalities. Definity contrast agent was given IV to delineate the left ventricular endocardial borders. The left ventricular internal cavity size was normal in size. There is no left ventricular hypertrophy. Left ventricular diastolic parameters were normal.  Right Ventricle: The right ventricular size is normal. Right vetricular wall thickness was not well visualized. Right ventricular systolic function is normal. Tricuspid regurgitation signal is inadequate for assessing PA pressure.  Left Atrium: Left atrial size was normal in size.  Right Atrium: Right atrial size was normal in size.  Pericardium: There is no evidence of pericardial effusion.  Mitral Valve: The mitral valve is normal in structure. No evidence of mitral valve regurgitation. No evidence of mitral valve stenosis.  Tricuspid Valve: The tricuspid valve is normal in structure. Tricuspid valve regurgitation is trivial.  Aortic Valve: The aortic valve is tricuspid. Aortic valve regurgitation is trivial. Aortic regurgitation PHT measures 394 msec. Aortic valve sclerosis/calcification is present, without any evidence of aortic stenosis.  Pulmonic Valve: The pulmonic valve was not well visualized. Pulmonic valve regurgitation is not visualized.  Aorta: The aortic root and ascending aorta are structurally normal, with no evidence of dilitation.  Venous: The inferior vena cava is normal in size with greater than 50% respiratory variability, suggesting right atrial pressure of 3 mmHg.  IAS/Shunts: The interatrial septum was not well visualized.   LEFT  VENTRICLE PLAX 2D LVIDd:         5.10 cm   Diastology LVIDs:         3.10 cm   LV e' medial:    7.83 cm/s LV PW:         0.90 cm   LV E/e' medial:  14.2 LV IVS:        0.90 cm   LV e' lateral:   13.35 cm/s LVOT diam:     2.10 cm   LV E/e' lateral: 8.4 LV SV:         62 LV SV Index:   31 LVOT Area:     3.46 cm   RIGHT VENTRICLE              IVC RV Basal diam:  4.10 cm     IVC diam: 1.40 cm RV S prime:     13.30 cm/s TAPSE (M-mode): 1.7 cm  LEFT ATRIUM             Index        RIGHT ATRIUM           Index LA diam:        4.70 cm 2.33 cm/m   RA Area:     15.60 cm LA Vol (A2C):   58.5 ml 29.06 ml/m  RA Volume:   41.40 ml  20.57 ml/m LA Vol (A4C):   55.9 ml 27.77 ml/m LA Biplane Vol: 60.4 ml 30.00 ml/m AORTIC VALVE LVOT Vmax:   80.40 cm/s LVOT Vmean:  54.750 cm/s LVOT VTI:    0.178 m AI PHT:      394 msec  AORTA Ao Root diam: 3.60 cm Ao Asc diam:  3.60 cm  MITRAL VALVE MV Area (PHT): 3.56 cm     SHUNTS MV Decel Time: 213 msec     Systemic VTI:  0.18 m MV E velocity: 111.50 cm/s  Systemic Diam: 2.10 cm MV A velocity: 102.35 cm/s MV E/A ratio:  1.09  Epifanio Lesches MD Electronically signed by Epifanio Lesches MD Signature Date/Time: 06/14/2022/7:19:38 PM    Final        CARDIAC MRI  MR CARDIAC MORPHOLOGY W WO CONTRAST 12/18/2013  Narrative CLINICAL DATA:  Ischemic Cardiomyopathy  EXAM: CARDIAC MRI  TECHNIQUE: The patient was scanned on a 1.5 Tesla GE magnet. A dedicated cardiac coil was used. Functional imaging was done using Fiesta sequences. 2,3, and 4 chamber views were done to assess for RWMA's. Modified Simpson's rule using a short axis stack was used to calculate an ejection fraction on a dedicated work [employer]. The patient received 38 cc of Multihance. After 10 minutes inversion recovery sequences were used to assess for infiltration and scar tissue.  CONTRAST:  Multihance 38 cc  FINDINGS: There was moderate LVE. There was mild LAE. RA and RV were normal. There was no ASD/VSD or pericardial effusion. The aortic valve was trileaflet and mildly thickened with mild AR. The aortic root was normal at 3.4 cm. The mitral, and tricuspid valves were normal. There was fairly marked paradoxic septal motion and translational motion to LV. The quantitative EF was  43% (EDV 257 cc ESV 146 cc and SV or 112 cc) Delayed enhancement images showed only a small area of mid myocardial uptake in the mid septum.  IMPRESSION: 1) Moderate LVE with markedly abnormal septal motion. Distal anterior and apical wall hypokinesis EF 43%  2) Only small area of mid myocardial mid septal gadolinium uptake  on delayed inversion recovery sequences  3) Mild AR trileaflet aortic valve  4) Mild LAE  5) Normal RA/RV  Patient would appear to not meet criteria for AICD but if he has congestive failure symptoms may benefit from CRT  Charlton Haws   Electronically Signed By: Charlton Haws M.D. On: 12/18/2013 19:24   ______________________________________________________________________________________________       Recent Labs: 05/24/2022: BUN 23; Creatinine, Ser 1.25; NT-Pro BNP 138; Potassium 4.0; Sodium 144 12/12/2022: Hemoglobin 13.9; Platelet Count 84  Recent Lipid Panel    Component Value Date/Time   CHOL 106 04/05/2013 0320   TRIG 136 04/05/2013 0320   HDL 22 (L) 04/05/2013 0320   CHOLHDL 4.8 04/05/2013 0320   VLDL 27 04/05/2013 0320   LDLCALC 57 04/05/2013 0320    Physical Exam:    VS:  BP (!) 108/56 (BP Location: Right Arm)   Pulse 66   Ht 5' 6.5" (1.689 m)   Wt 97.1 kg   SpO2 96%   BMI 34.02 kg/m     Wt Readings from Last 3 Encounters:  05/11/23 97.1 kg  01/23/23 96.4 kg  12/12/22 95.7 kg    GEN: Morbid Obesity. No acute distress HEENT: Normal NECK: No JVD. CARDIAC:  RRR no gallop, +1 bilateral  edema, mild diastolic murmur VASCULAR:  Normal Pulses. No bruits. RESPIRATORY:  Clear to auscultation without rales, wheezing or rhonchi  ABDOMEN: Soft, non-tender, non-distended, No pulsatile mass, MUSCULOSKELETAL: No deformity  SKIN: Warm and dry NEUROLOGIC:  Alert and oriented x 3 PSYCHIATRIC:  Normal affect   ASSESSMENT:    1. Acute on chronic systolic heart failure (HCC)   2. Aortic valve disease   3. Primary hypertension   4.  Coronary artery disease involving native coronary artery of native heart without angina pectoris   5. S/P CABG x 4   6. ICD (implantable cardioverter-defibrillator) in place      PLAN:    Acute on Chronic Systolic Heart Failure Coronary artery disease and chronic systolic heart failure with a previously reduced ejection fraction, now recovered. On goal-directed medical therapy including ACE inhibitor, SGLT2 inhibitor, carvedilol, and atorvastatin. Reports well-managed symptoms with rare exertional fatigue and no recent nitroglycerin use. Mild peripheral edema likely due to fluid retention. Discussed managing fluid retention, especially during upcoming travel, and potential dietary challenges. - Increase torsemide to 20 mg PO daily as needed for fluid retention - Increase potassium supplementation as needed when increasing torsemide - Monitor for symptoms of dyspnea, swelling, or weight gain and adjust torsemide accordingly - Plan for echocardiogram in one year if symptoms remain well-managed - continue current GDMT - s/p CRT  Peripheral Edema Mild peripheral edema likely related to fluid retention from chronic heart failure. Reports leg swelling consistent with previous levels. Emphasized careful monitoring of symptoms and appropriate diuretic use. - Increase torsemide to 20 mg PO daily as needed for fluid retention - Increase potassium supplementation as needed when increasing torsemide - Monitor for increased swelling and adjust torsemide accordingly  Aortic regurgitation - repeat echo in one year   CAD with prior CABG - (x4 with LIMA to LAD; SV to Diag; SV to OM; Free radial RCA) - LDL < 55)  - anginal equivalent was fatigue; then a positive Lexiscan - continue ASA 81 mg  - continue current statin - no PRN nitroglycerin needed - if profound fatigue, PET MPI   Travel Considerations Planning to attend two out-of-state rallies in May and July. Discussed potential dietary  challenges during travel  and importance of managing fluid retention. Provided additional torsemide and potassium for use as needed during travel. - Plan for follow-up visit in one year if symptoms remain well-managed - Use additional torsemide and potassium as needed during travel to manage fluid retention  Medication Adjustments/Labs and Tests Ordered: Current medicines are reviewed at length with the patient today.  Concerns regarding medicines are outlined above.  Orders Placed This Encounter  Procedures   ECHOCARDIOGRAM COMPLETE   Meds ordered this encounter  Medications   torsemide (DEMADEX) 20 MG tablet    Sig: Take 1 tablet (20 mg total) by mouth daily. May take an extra dose daily as needed for swelling/ shortness of breath    Dispense:  90 tablet    Refill:  3    Patient Instructions  Medication Instructions:  Your physician has recommended you make the following change in your medication:  TAKE:  torsemide 20 mg by mouth once daily as needed for swelling/ shortness of breath  TAKE: Potassium 20 mEq by mouth as needed when you take an extra torsemide  *If you need a refill on your cardiac medications before your next appointment, please call your pharmacy*  Lab Work: NONE If you have labs (blood work) drawn today and your tests are completely normal, you will receive your results only by: MyChart Message (if you have MyChart) OR A paper copy in the mail If you have any lab test that is abnormal or we need to change your treatment, we will call you to review the results.  Testing/Procedures: MARCH- - - 2026   Your physician has requested that you have an echocardiogram. Echocardiography is a painless test that uses sound waves to create images of your heart. It provides your doctor with information about the size and shape of your heart and how well your heart's chambers and valves are working. This procedure takes approximately one hour. There are no restrictions for this  procedure. Please do NOT wear cologne, perfume, aftershave, or lotions (deodorant is allowed). Please arrive 15 minutes prior to your appointment time.  Please note: We ask at that you not bring children with you during ultrasound (echo/ vascular) testing. Due to room size and safety concerns, children are not allowed in the ultrasound rooms during exams. Our front office staff cannot provide observation of children in our lobby area while testing is being conducted. An adult accompanying a patient to their appointment will only be allowed in the ultrasound room at the discretion of the ultrasound technician under special circumstances. We apologize for any inconvenience.   Follow-Up: At [facility], you and your health needs are our priority.  As part of our continuing mission to provide you with exceptional heart care, our providers are all part of one team.  This team includes your primary Cardiologist (physician) and Advanced Practice Providers or APPs (Physician Assistants and Nurse Practitioners) who all work together to provide you with the care you need, when you need it.  Your next appointment:   1 year(s)  Provider:   Christell Constant, MD      Other Instructions       1st Floor: - Lobby - Registration  - Pharmacy  - Lab - Cafe  2nd Floor: - PV Lab - Diagnostic Testing (echo, CT, nuclear med)  3rd Floor: - Vacant  4th Floor: - TCTS (cardiothoracic surgery) - AFib Clinic - Structural Heart Clinic - Vascular Surgery  - Vascular Ultrasound  5th Floor: - HeartCare Cardiology (general  and EP) - Clinical Pharmacy for coumadin, hypertension, lipid, weight-loss medications, and med management appointments    Valet parking services will be available as well.      Signed, Christell Constant, MD  05/11/2023 12:17 PM    [facility]

## 2023-05-21 ENCOUNTER — Ambulatory Visit (INDEPENDENT_AMBULATORY_CARE_PROVIDER_SITE_OTHER): Payer: Medicare Other

## 2023-05-21 DIAGNOSIS — I255 Ischemic cardiomyopathy: Secondary | ICD-10-CM

## 2023-05-21 DIAGNOSIS — I5022 Chronic systolic (congestive) heart failure: Secondary | ICD-10-CM

## 2023-05-22 LAB — CUP PACEART REMOTE DEVICE CHECK
Battery Remaining Longevity: 24 mo
Battery Remaining Percentage: 27 %
Battery Voltage: 2.87 V
Brady Statistic AP VP Percent: 46 %
Brady Statistic AP VS Percent: 1.1 %
Brady Statistic AS VP Percent: 53 %
Brady Statistic AS VS Percent: 1 %
Brady Statistic RA Percent Paced: 47 %
Date Time Interrogation Session: 20250407020016
HighPow Impedance: 75 Ohm
HighPow Impedance: 75 Ohm
Implantable Lead Connection Status: 753985
Implantable Lead Connection Status: 753985
Implantable Lead Connection Status: 753985
Implantable Lead Implant Date: 20181115
Implantable Lead Implant Date: 20181115
Implantable Lead Implant Date: 20181115
Implantable Lead Location: 753858
Implantable Lead Location: 753859
Implantable Lead Location: 753860
Implantable Lead Model: 7122
Implantable Pulse Generator Implant Date: 20181115
Lead Channel Impedance Value: 400 Ohm
Lead Channel Impedance Value: 450 Ohm
Lead Channel Impedance Value: 600 Ohm
Lead Channel Pacing Threshold Amplitude: 0.5 V
Lead Channel Pacing Threshold Amplitude: 0.625 V
Lead Channel Pacing Threshold Amplitude: 0.625 V
Lead Channel Pacing Threshold Pulse Width: 0.5 ms
Lead Channel Pacing Threshold Pulse Width: 0.5 ms
Lead Channel Pacing Threshold Pulse Width: 0.5 ms
Lead Channel Sensing Intrinsic Amplitude: 11.7 mV
Lead Channel Sensing Intrinsic Amplitude: 2.9 mV
Lead Channel Setting Pacing Amplitude: 1.5 V
Lead Channel Setting Pacing Amplitude: 2 V
Lead Channel Setting Pacing Amplitude: 2 V
Lead Channel Setting Pacing Pulse Width: 0.5 ms
Lead Channel Setting Pacing Pulse Width: 0.5 ms
Lead Channel Setting Sensing Sensitivity: 0.5 mV
Pulse Gen Serial Number: 9780956
Zone Setting Status: 755011

## 2023-05-23 ENCOUNTER — Encounter: Payer: Self-pay | Admitting: Internal Medicine

## 2023-05-31 ENCOUNTER — Encounter: Payer: Self-pay | Admitting: Podiatry

## 2023-05-31 ENCOUNTER — Ambulatory Visit (INDEPENDENT_AMBULATORY_CARE_PROVIDER_SITE_OTHER): Payer: Medicare Other | Admitting: Podiatry

## 2023-05-31 DIAGNOSIS — M79674 Pain in right toe(s): Secondary | ICD-10-CM | POA: Diagnosis not present

## 2023-05-31 DIAGNOSIS — M79675 Pain in left toe(s): Secondary | ICD-10-CM

## 2023-05-31 DIAGNOSIS — L600 Ingrowing nail: Secondary | ICD-10-CM

## 2023-05-31 DIAGNOSIS — E1159 Type 2 diabetes mellitus with other circulatory complications: Secondary | ICD-10-CM | POA: Diagnosis not present

## 2023-05-31 DIAGNOSIS — B351 Tinea unguium: Secondary | ICD-10-CM | POA: Diagnosis not present

## 2023-05-31 NOTE — Progress Notes (Signed)
 This patient returns to my office for at risk foot care.  This patient requires this care by a professional since this patient will be at risk due to having diabetes and thrombocytopenia.  This patient is unable to cut nails himself since the patient cannot reach his nails.These nails are painful walking and wearing shoes.  This patient presents for at risk foot care today.  General Appearance  Alert, conversant and in no acute stress.  Vascular  Dorsalis pedis and posterior tibial  pulses are palpable  bilaterally.  Capillary return is within normal limits  bilaterally. Temperature is within normal limits  bilaterally.  Neurologic  Senn-Weinstein monofilament wire test within normal limits  bilaterally. Muscle power within normal limits bilaterally.  Nails Thick disfigured discolored nails with subungual debris  from hallux to fifth toes bilaterally. No evidence of bacterial infection or drainage bilaterally.   Orthopedic  No limitations of motion  feet .  No crepitus or effusions noted.  No bony pathology or digital deformities noted.  Skin  normotropic skin with no porokeratosis noted bilaterally.  No signs of infections or ulcers noted.    Onychomycosis  Pain in right toes  Pain in left toes  Consent was obtained for treatment procedures.   Mechanical debridement of nails 1-5  bilaterally performed with a nail nipper.  Filed  with dremel tool.   Return office visit    10 weeks                 Told patient to return for periodic foot care and evaluation due to potential at risk complications.   Helane Gunther DPM

## 2023-06-11 ENCOUNTER — Other Ambulatory Visit: Payer: Self-pay

## 2023-06-11 DIAGNOSIS — D751 Secondary polycythemia: Secondary | ICD-10-CM

## 2023-06-11 DIAGNOSIS — D72819 Decreased white blood cell count, unspecified: Secondary | ICD-10-CM

## 2023-06-11 DIAGNOSIS — D696 Thrombocytopenia, unspecified: Secondary | ICD-10-CM

## 2023-06-12 ENCOUNTER — Inpatient Hospital Stay (HOSPITAL_BASED_OUTPATIENT_CLINIC_OR_DEPARTMENT_OTHER): Payer: Medicare Other | Admitting: Hematology and Oncology

## 2023-06-12 ENCOUNTER — Inpatient Hospital Stay: Payer: Medicare Other | Attending: Hematology and Oncology

## 2023-06-12 VITALS — BP 112/54 | HR 64 | Temp 98.2°F | Resp 17 | Ht 66.5 in | Wt 210.4 lb

## 2023-06-12 DIAGNOSIS — D696 Thrombocytopenia, unspecified: Secondary | ICD-10-CM | POA: Insufficient documentation

## 2023-06-12 DIAGNOSIS — G8929 Other chronic pain: Secondary | ICD-10-CM | POA: Insufficient documentation

## 2023-06-12 DIAGNOSIS — D72819 Decreased white blood cell count, unspecified: Secondary | ICD-10-CM | POA: Insufficient documentation

## 2023-06-12 DIAGNOSIS — K580 Irritable bowel syndrome with diarrhea: Secondary | ICD-10-CM | POA: Diagnosis not present

## 2023-06-12 DIAGNOSIS — D751 Secondary polycythemia: Secondary | ICD-10-CM

## 2023-06-12 DIAGNOSIS — M25559 Pain in unspecified hip: Secondary | ICD-10-CM | POA: Diagnosis not present

## 2023-06-12 LAB — CBC WITH DIFFERENTIAL (CANCER CENTER ONLY)
Abs Immature Granulocytes: 0.01 10*3/uL (ref 0.00–0.07)
Basophils Absolute: 0 10*3/uL (ref 0.0–0.1)
Basophils Relative: 1 %
Eosinophils Absolute: 0.1 10*3/uL (ref 0.0–0.5)
Eosinophils Relative: 4 %
HCT: 44.1 % (ref 39.0–52.0)
Hemoglobin: 14.6 g/dL (ref 13.0–17.0)
Immature Granulocytes: 0 %
Lymphocytes Relative: 23 %
Lymphs Abs: 0.7 10*3/uL (ref 0.7–4.0)
MCH: 27.4 pg (ref 26.0–34.0)
MCHC: 33.1 g/dL (ref 30.0–36.0)
MCV: 82.7 fL (ref 80.0–100.0)
Monocytes Absolute: 0.3 10*3/uL (ref 0.1–1.0)
Monocytes Relative: 11 %
Neutro Abs: 1.8 10*3/uL (ref 1.7–7.7)
Neutrophils Relative %: 61 %
Platelet Count: 90 10*3/uL — ABNORMAL LOW (ref 150–400)
RBC: 5.33 MIL/uL (ref 4.22–5.81)
RDW: 14.9 % (ref 11.5–15.5)
WBC Count: 2.9 10*3/uL — ABNORMAL LOW (ref 4.0–10.5)
nRBC: 0 % (ref 0.0–0.2)

## 2023-06-12 LAB — CMP (CANCER CENTER ONLY)
ALT: 29 U/L (ref 0–44)
AST: 24 U/L (ref 15–41)
Albumin: 4.2 g/dL (ref 3.5–5.0)
Alkaline Phosphatase: 49 U/L (ref 38–126)
Anion gap: 7 (ref 5–15)
BUN: 25 mg/dL — ABNORMAL HIGH (ref 8–23)
CO2: 26 mmol/L (ref 22–32)
Calcium: 9.1 mg/dL (ref 8.9–10.3)
Chloride: 106 mmol/L (ref 98–111)
Creatinine: 1.47 mg/dL — ABNORMAL HIGH (ref 0.61–1.24)
GFR, Estimated: 51 mL/min — ABNORMAL LOW (ref >60)
Glucose, Bld: 210 mg/dL — ABNORMAL HIGH (ref 70–99)
Potassium: 3.8 mmol/L (ref 3.5–5.1)
Sodium: 139 mmol/L (ref 135–145)
Total Bilirubin: 0.5 mg/dL (ref 0.0–1.2)
Total Protein: 6.8 g/dL (ref 6.5–8.1)

## 2023-06-13 ENCOUNTER — Encounter: Payer: Self-pay | Admitting: Hematology and Oncology

## 2023-06-13 NOTE — Progress Notes (Signed)
 [facility] CONSULT NOTE  Patient Care Team: Benedetta Bradley, MD as PCP - General (Internal Medicine) Jacqueline Matsu, MD as PCP - Sleep Medicine (Cardiology) Tammie Fall, MD as PCP - Electrophysiology (Cardiology) Jann Melody, MD as PCP - Cardiology (Cardiology) Shon Downing, MD as Attending Physician (Cardiothoracic Surgery)  CHIEF COMPLAINTS/PURPOSE OF CONSULTATION:  FU about polycythemia and thrombocytopenia.  ASSESSMENT & PLAN:   This is a very pleasant 73 year old male patient with past medical history significant for coronary artery disease status post CABG, hypertension, dyslipidemia referred to hematology for evaluation of cytopenias.  Assessment & Plan  Leukopenia,thrombocytopenia Overall stable blood counts Continue surveillance, labs and FU in 6 months.  Hip pain Chronic hip pain with variable intensity. Physical therapy initiated. - Encourage adherence to home exercise program.  Irritable bowel symptoms Intermittent bowel irritation with constipation and diarrhea, possibly related to dietary triggers. Previous colonoscopy was normal. - Recommend keeping a food diary to identify potential dietary triggers. - Advise increasing fiber intake and regular physical activity. - Schedule follow-up in six months.    HISTORY OF PRESENTING ILLNESS:   GEARLD LEMERY 73 y.o. male is here because of polycythemia.   Interval history  Discussed the use of AI scribe software for clinical note transcription with the patient, who gave verbal consent to proceed. History of Present Illness BRANFORD ALVIRA "Siegfried Dress" is a 73 year old male who presents for a follow-up visit regarding hip pain and blood count monitoring.  He experiences ongoing hip pain with variable intensity, more bothersome on some days. He attends physical therapy but does not perform the recommended exercises at home frequently.  He experiences occasional bowel  irritation, alternating between constipation and diarrhea, associated with certain foods, particularly spices. He is unsure which specific spices trigger these symptoms and notes inconsistency in reactions. No lactose intolerance is reported, and his last colonoscopy was normal.  His blood counts, including white blood cells and platelet count, have remained stable. He continues on the same medications as before, with no new additions. He has a pacemaker with approximately two years of battery life remaining.  No recent hospital visits or new medications. No trouble urinating but notes occasional bowel irritation.   Rest of the pertinent 10 point ROS reviewed and neg  MEDICAL HISTORY:  Past Medical History:  Diagnosis Date   AICD (automatic cardioverter/defibrillator) present 12/28/2016   St Jude placed by dr g. taylor for ICM/ CHF   Allergic rhinitis, seasonal    Benign localized prostatic hyperplasia with lower urinary tract symptoms (LUTS)    CAD (coronary artery disease)    primary cardiology--- dr h. Felipe Horton;  a. 2014 CABG x 4 (L->LAD, V->Diag, V->OM, Rad->RCA);  b. 03/2013 Cath: LM nl, LAD 80p, LCX 49m, OM1 100, RCA 70-27m, all grafts patent, EF 40%.   Chronic low back pain    Chronic systolic CHF (congestive heart failure) (HCC)    followed by cardiology   CKD (chronic kidney disease), stage III (HCC)    Constipation, chronic    Deviated nasal septum    followed by dr Melissa Spring---  severe   Diverticulosis of colon    Family history of adverse reaction to anesthesia    sister--- ponv   GERD (gastroesophageal reflux disease)    History of colon polyps    History of kidney stones    Hypertension    followed by cardiology and pcp   Infection of great toe 04/12/2021   left ,  per pt due to ingrown toe nail   Ischemic cardiomyopathy    followed by dr h. Felipe Horton;;  a. 03/2013 Echo: EF 25-30%, diff HK, antsep/apical AK, Gr2 DD, Ao sclerosis, mild AI, PASP 36.;    last echo in epic  02-02-2020 , ef 60-65% with mild septal/ inferior hypokinesis G1DD, mild AR no stenosis   Left bundle branch block    Lipoma of back    Malignant neoplasm prostate [facility])    urologist-- dr newsome/  radiation oncology-- dr Lorri Rota;  dx 10/ 2022,  Gleason 4=%, PSA 7.84   Mild aortic valve regurgitation    per last echo in epic 01-2020  w/ mild calcification/ thickening but no sclerosis / stenosis   Mixed hyperlipidemia    takes Lipitor  daily   OA (osteoarthritis)    hands   OSA (obstructive sleep apnea)    "dx'd; having another sleep study later this month; never followed up on getting a mask" (12/28/2016)   OSA on CPAP    followed by dr t. turner--- per study in epic 01-11-2017, Moderate OSA ( AHI of 23.1/hr with O2 desats as low as 81%.   Peripheral edema    Peripheral neuropathy    Polycythemia, secondary    followed by oncologist-- dr Amaryllis Badger,  per last note in epic 02-03-2021 secondary to OSA and noncompliant with cpap   S/P CABG x 4 08/23/2012   LIMA-- LAD,  SVG --Diag,  SVG -- OM,  rad -- RCA   Thrombocytopenia (HCC)    followed by oncology/ hematology--- dr Arlester Ladd. Mandee Pluta ([facility]),  hx phlebotomy   Type II diabetes mellitus (HCC)    followed by pcp    (04-14-2021  pt stated checks blood sugar daily in am,  fasting sugar-- 90-150)   Wears hearing aid in both ears     SURGICAL HISTORY: Past Surgical History:  Procedure Laterality Date   BIV ICD INSERTION CRT-D N/A 12/28/2016   Procedure: BIV ICD INSERTION CRT-D;  Surgeon: Tammie Fall, MD;  Location: [facility] INVASIVE CV LAB;  Service: Cardiovascular;  Laterality: N/A;   COLONOSCOPY     CORONARY ARTERY BYPASS GRAFT N/A 08/23/2012   Procedure: CORONARY ARTERY BYPASS GRAFTING times four using Right Greater Saphenous Vein Graft harvested endoscopically and Left Radail Artery Graft;  Surgeon: Heriberto London, MD;  Location: [facility] OR;  Service: Open Heart Surgery;  Laterality: N/A;   GOLD SEED IMPLANT N/A 04/19/2021   Procedure:  GOLD SEED IMPLANT;  Surgeon: Sherlyn Ditto, MD;  Location: [facility];  Service: Urology;  Laterality: N/A;  30 MINS FOR THIS CASE   INTRAOPERATIVE TRANSESOPHAGEAL ECHOCARDIOGRAM N/A 08/23/2012   Procedure: INTRAOPERATIVE TRANSESOPHAGEAL ECHOCARDIOGRAM;  Surgeon: Heriberto London, MD;  Location: [facility] OR;  Service: Open Heart Surgery;  Laterality: N/A;   KNEE ARTHROSCOPY Right 2013   LEFT HEART CATHETERIZATION WITH CORONARY ANGIOGRAM N/A 08/15/2012   Procedure: LEFT HEART CATHETERIZATION WITH CORONARY ANGIOGRAM;  Surgeon: Mickiel Albany, MD;  Location: [facility] CATH LAB;  Service: Cardiovascular;  Laterality: N/A;   LEFT HEART CATHETERIZATION WITH CORONARY ANGIOGRAM N/A 04/04/2013   Procedure: LEFT HEART CATHETERIZATION WITH CORONARY ANGIOGRAM;  Surgeon: Millicent Ally, MD;  Location: [facility] CATH LAB;  Service: Cardiovascular;  Laterality: N/A;   ORIF TIBIA PLATEAU Left 10/16/2008   @ [facility];   AND ORIF LEFT HUMERUS SHAFT   RADIAL ARTERY HARVEST Left 08/23/2012   Procedure: RADIAL ARTERY HARVEST;  Surgeon: Heriberto London, MD;  Location:  [facility] OR;  Service: Open Heart Surgery;  Laterality: Left;   SPACE OAR INSTILLATION N/A 04/19/2021   Procedure: SPACE OAR INSTILLATION;  Surgeon: Sherlyn Ditto, MD;  Location: [facility];  Service: Urology;  Laterality: N/A;    SOCIAL HISTORY: Social History   Socioeconomic History   Marital status: Widowed    Spouse name: Not on file   Number of children: Not on file   Years of education: Not on file   Highest education level: Not on file  Occupational History   Occupation: Surveyor, 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, fire and burglar alarms    Tobacco Use   Smoking status: Never   Smokeless tobacco: Never  Vaping Use   Vaping status: Never Used  Substance and Sexual Activity   Alcohol use: Yes    Comment: seldom   Drug use: No   Sexual activity: Not on file  Other Topics Concern   Not on file  Social History Narrative    Not on file   Social Drivers of Health   Financial Resource Strain: Not on file  Food Insecurity: Not on file  Transportation Needs: Not on file  Physical Activity: Not on file  Stress: Not on file  Social Connections: Not on file  Intimate Partner Violence: Not on file    FAMILY HISTORY: Family History  Problem Relation Age of Onset   Hypertension Mother    Hyperlipidemia Mother    Cancer Mother        uterine   Anemia Mother    Fibromyalgia Sister    Heart attack Paternal Uncle    Stroke Paternal Aunt     ALLERGIES:  is allergic to metformin hcl, procaine, rosuvastatin, spironolactone , and tylenol  with codeine #3 [acetaminophen -codeine].  MEDICATIONS:  Current Outpatient Medications  Medication Sig Dispense Refill   aspirin  EC 81 MG tablet Take 1 tablet (81 mg total) by mouth daily. 30 tablet 11   atorvastatin  (LIPITOR ) 40 MG tablet Take 40 mg by mouth daily at 6 PM.     B Complex CAPS Take 1 capsule by mouth daily.     carvedilol  (COREG ) 25 MG tablet TAKE 1 TABLET BY MOUTH TWICE A DAY 180 tablet 3   cetirizine (ZYRTEC) 10 MG tablet Take 10 mg by mouth at bedtime.     empagliflozin (JARDIANCE) 25 MG TABS tablet Take 1 tablet by mouth daily.     famotidine  (PEPCID ) 40 MG tablet Take 1 tablet by mouth at bedtime.     fenofibrate  micronized (LOFIBRA) 200 MG capsule Take 200 mg by mouth daily before breakfast.     fluticasone (FLONASE) 50 MCG/ACT nasal spray 2 sprays at bedtime.     glimepiride  (AMARYL ) 1 MG tablet Take 1 mg by mouth at bedtime.     lisinopril  (ZESTRIL ) 40 MG tablet TAKE 1 TABLET BY MOUTH EVERY DAY 90 tablet 3   Misc Natural Products (GLUCOSAMINE-CHONDROITIN PLUS) TABS Take 1 tablet by mouth 2 (two) times daily.     Multiple Vitamins-Minerals (ONE-A-DAY MENS 50+) TABS Take by mouth daily.     naproxen  sodium (ALEVE ) 220 MG tablet Take 220 mg by mouth daily as needed (pain).     nitroGLYCERIN  (NITROSTAT ) 0.4 MG SL tablet Place 1 tablet (0.4 mg total) under  the tongue every 5 (five) minutes as needed for chest pain. 25 tablet 3   OMEGA 3 1000 MG CAPS Take 1 capsule by mouth daily.     potassium chloride  SA (  K-DUR,KLOR-CON ) 20 MEQ tablet Take 20 mEq by mouth 2 (two) times daily. Take an extra dose daily when take an extra dose of torsemide      saccharomyces boulardii (FLORASTOR) 250 MG capsule 2 (two) times daily.     sodium chloride  (OCEAN) 0.65 % SOLN nasal spray Place 2 sprays into both nostrils in the morning and at bedtime.  0   torsemide  (DEMADEX ) 20 MG tablet Take 1 tablet (20 mg total) by mouth daily. May take an extra dose daily as needed for swelling/ shortness of breath 90 tablet 3   vitamin C  (ASCORBIC ACID ) 500 MG tablet Take 500 mg 2 (two) times daily by mouth.      No current facility-administered medications for this visit.   PHYSICAL EXAMINATION:  ECOG PERFORMANCE STATUS: 0 - Asymptomatic  Vitals:   06/12/23 1148  BP: (!) 112/54  Pulse: 64  Resp: 17  Temp: 98.2 F (36.8 C)  SpO2: 98%    Filed Weights   06/12/23 1148  Weight: 210 lb 7 oz (95.5 kg)   Physical Exam Constitutional:      Appearance: Normal appearance.  HENT:     Head: Normocephalic and atraumatic.  Cardiovascular:     Rate and Rhythm: Normal rate and regular rhythm.  Pulmonary:     Effort: Pulmonary effort is normal.     Breath sounds: Normal breath sounds.  Abdominal:     General: Abdomen is flat.  Musculoskeletal:        General: Normal range of motion.     Cervical back: Normal range of motion and neck supple.  Skin:    General: Skin is warm.  Neurological:     General: No focal deficit present.     Mental Status: He is alert.      LABORATORY DATA:  I have reviewed the data as listed Lab Results  Component Value Date   WBC 2.9 (L) 06/12/2023   HGB 14.6 06/12/2023   HCT 44.1 06/12/2023   MCV 82.7 06/12/2023   PLT 90 (L) 06/12/2023     Chemistry      Component Value Date/Time   NA 139 06/12/2023 1119   NA 144 05/24/2022 1310    K 3.8 06/12/2023 1119   CL 106 06/12/2023 1119   CO2 26 06/12/2023 1119   BUN 25 (H) 06/12/2023 1119   BUN 23 05/24/2022 1310   CREATININE 1.47 (H) 06/12/2023 1119      Component Value Date/Time   CALCIUM  9.1 06/12/2023 1119   ALKPHOS 49 06/12/2023 1119   AST 24 06/12/2023 1119   ALT 29 06/12/2023 1119   BILITOT 0.5 06/12/2023 1119       RADIOGRAPHIC STUDIES: I have personally reviewed the radiological images as listed and agreed with the findings in the report. CUP PACEART REMOTE DEVICE CHECK Result Date: 05/22/2023 Scheduled remote reviewed. Normal device function.  Presenting rhythm:  AP-BIV paced.  HF diagnostics have been abnormal in this monitoring period. Next remote 91 days. - CS, CVRS  Total time spent: 20 minutes including history, physical exam, review of records, counseling and coordination of care   Murleen Arms, MD 06/13/2023 7:54 AM

## 2023-06-23 ENCOUNTER — Other Ambulatory Visit: Payer: Self-pay | Admitting: Internal Medicine

## 2023-06-28 ENCOUNTER — Other Ambulatory Visit: Payer: Self-pay | Admitting: Internal Medicine

## 2023-07-04 NOTE — Progress Notes (Signed)
 Remote ICD transmission.

## 2023-07-04 NOTE — Addendum Note (Signed)
 Addended by: Edra Govern D on: 07/04/2023 04:40 PM   Modules accepted: Orders

## 2023-08-09 ENCOUNTER — Ambulatory Visit (INDEPENDENT_AMBULATORY_CARE_PROVIDER_SITE_OTHER): Admitting: Podiatry

## 2023-08-09 ENCOUNTER — Encounter: Payer: Self-pay | Admitting: Podiatry

## 2023-08-09 DIAGNOSIS — M79674 Pain in right toe(s): Secondary | ICD-10-CM

## 2023-08-09 DIAGNOSIS — E1159 Type 2 diabetes mellitus with other circulatory complications: Secondary | ICD-10-CM

## 2023-08-09 DIAGNOSIS — L600 Ingrowing nail: Secondary | ICD-10-CM

## 2023-08-09 DIAGNOSIS — B351 Tinea unguium: Secondary | ICD-10-CM

## 2023-08-09 DIAGNOSIS — M79675 Pain in left toe(s): Secondary | ICD-10-CM

## 2023-08-09 NOTE — Progress Notes (Signed)
 This patient returns to my office for at risk foot care.  This patient requires this care by a professional since this patient will be at risk due to having diabetes and thrombocytopenia.  This patient is unable to cut nails himself since the patient cannot reach his nails.These nails are painful walking and wearing shoes.  This patient presents for at risk foot care today.  General Appearance  Alert, conversant and in no acute stress.  Vascular  Dorsalis pedis and posterior tibial  pulses are palpable  bilaterally.  Capillary return is within normal limits  bilaterally. Temperature is within normal limits  bilaterally.  Neurologic  Senn-Weinstein monofilament wire test within normal limits  bilaterally. Muscle power within normal limits bilaterally.  Nails Thick disfigured discolored nails with subungual debris  from hallux to fifth toes bilaterally. No evidence of bacterial infection or drainage bilaterally.   Orthopedic  No limitations of motion  feet .  No crepitus or effusions noted.  No bony pathology or digital deformities noted.  Skin  normotropic skin with no porokeratosis noted bilaterally.  No signs of infections or ulcers noted.    Onychomycosis  Pain in right toes  Pain in left toes  Consent was obtained for treatment procedures.   Mechanical debridement of nails 1-5  bilaterally performed with a nail nipper.  Filed  with dremel tool.   Return office visit    10 weeks                 Told patient to return for periodic foot care and evaluation due to potential at risk complications.   Helane Gunther DPM

## 2023-08-27 ENCOUNTER — Ambulatory Visit

## 2023-08-27 ENCOUNTER — Ambulatory Visit: Payer: Self-pay | Admitting: Internal Medicine

## 2023-08-27 DIAGNOSIS — I255 Ischemic cardiomyopathy: Secondary | ICD-10-CM | POA: Diagnosis not present

## 2023-08-27 LAB — CUP PACEART REMOTE DEVICE CHECK
Battery Remaining Longevity: 25 mo
Battery Remaining Percentage: 27 %
Battery Voltage: 2.87 V
Brady Statistic AP VP Percent: 42 %
Brady Statistic AP VS Percent: 1 %
Brady Statistic AS VP Percent: 57 %
Brady Statistic AS VS Percent: 1 %
Brady Statistic RA Percent Paced: 43 %
Date Time Interrogation Session: 20250713223059
HighPow Impedance: 70 Ohm
HighPow Impedance: 70 Ohm
Implantable Lead Connection Status: 753985
Implantable Lead Connection Status: 753985
Implantable Lead Connection Status: 753985
Implantable Lead Implant Date: 20181115
Implantable Lead Implant Date: 20181115
Implantable Lead Implant Date: 20181115
Implantable Lead Location: 753858
Implantable Lead Location: 753859
Implantable Lead Location: 753860
Implantable Lead Model: 7122
Implantable Pulse Generator Implant Date: 20181115
Lead Channel Impedance Value: 410 Ohm
Lead Channel Impedance Value: 460 Ohm
Lead Channel Impedance Value: 630 Ohm
Lead Channel Pacing Threshold Amplitude: 0.625 V
Lead Channel Pacing Threshold Amplitude: 0.625 V
Lead Channel Pacing Threshold Amplitude: 1 V
Lead Channel Pacing Threshold Pulse Width: 0.5 ms
Lead Channel Pacing Threshold Pulse Width: 0.5 ms
Lead Channel Pacing Threshold Pulse Width: 0.5 ms
Lead Channel Sensing Intrinsic Amplitude: 11.7 mV
Lead Channel Sensing Intrinsic Amplitude: 3.6 mV
Lead Channel Setting Pacing Amplitude: 1.625
Lead Channel Setting Pacing Amplitude: 2 V
Lead Channel Setting Pacing Amplitude: 2 V
Lead Channel Setting Pacing Pulse Width: 0.5 ms
Lead Channel Setting Pacing Pulse Width: 0.5 ms
Lead Channel Setting Sensing Sensitivity: 0.5 mV
Pulse Gen Serial Number: 9780956
Zone Setting Status: 755011

## 2023-10-16 ENCOUNTER — Encounter: Payer: Self-pay | Admitting: Podiatry

## 2023-10-16 ENCOUNTER — Ambulatory Visit (INDEPENDENT_AMBULATORY_CARE_PROVIDER_SITE_OTHER): Admitting: Podiatry

## 2023-10-16 DIAGNOSIS — M79674 Pain in right toe(s): Secondary | ICD-10-CM | POA: Diagnosis not present

## 2023-10-16 DIAGNOSIS — E1159 Type 2 diabetes mellitus with other circulatory complications: Secondary | ICD-10-CM

## 2023-10-16 DIAGNOSIS — B351 Tinea unguium: Secondary | ICD-10-CM | POA: Diagnosis not present

## 2023-10-16 DIAGNOSIS — M79675 Pain in left toe(s): Secondary | ICD-10-CM | POA: Diagnosis not present

## 2023-10-16 NOTE — Progress Notes (Signed)
 This patient returns to my office for at risk foot care.  This patient requires this care by a professional since this patient will be at risk due to having diabetes and thrombocytopenia.  This patient is unable to cut nails himself since the patient cannot reach his nails.These nails are painful walking and wearing shoes.  This patient presents for at risk foot care today.  General Appearance  Alert, conversant and in no acute stress.  Vascular  Dorsalis pedis and posterior tibial  pulses are palpable  bilaterally.  Capillary return is within normal limits  bilaterally. Temperature is within normal limits  bilaterally.  Neurologic  Senn-Weinstein monofilament wire test within normal limits  bilaterally. Muscle power within normal limits bilaterally.  Nails Thick disfigured discolored nails with subungual debris  from hallux to fifth toes bilaterally. No evidence of bacterial infection or drainage bilaterally.   Orthopedic  No limitations of motion  feet .  No crepitus or effusions noted.  No bony pathology or digital deformities noted.  Skin  normotropic skin with no porokeratosis noted bilaterally.  No signs of infections or ulcers noted.    Onychomycosis  Pain in right toes  Pain in left toes  Consent was obtained for treatment procedures.   Mechanical debridement of nails 1-5  bilaterally performed with a nail nipper.  Filed  with dremel tool.   Return office visit    10 weeks                 Told patient to return for periodic foot care and evaluation due to potential at risk complications.   Helane Gunther DPM

## 2023-10-29 LAB — LAB REPORT - SCANNED
A1c: 7.2
EGFR: 55

## 2023-10-31 ENCOUNTER — Other Ambulatory Visit: Payer: Self-pay | Admitting: Internal Medicine

## 2023-11-20 ENCOUNTER — Telehealth: Payer: Self-pay

## 2023-11-20 NOTE — Telephone Encounter (Signed)
 Unscheduled remote transmission: Scheduled non-billable. Normal device function. 1 AMS episode, 8 sec. HF diagnostics currently abnormal, ongoing for 17 days. Routed to alert group for review.  LM to assess patient symptoms.

## 2023-11-20 NOTE — Telephone Encounter (Signed)
 Patient states that he has prn Torsemide  to take if he needs it.  Was out of town last week, may have been eating out more.  No symptoms or signs of fluid retention.  Trend shows recent improvement back in normal range. He will continue with torsemide  prn and call us  if any signs of fluid retention.  He is due to see Dr. Waddell in December, will send to EP scheduling to call him tomorrow to set up. Also, says he is due for his ECHO, was told he would need a repeat scheduled now in the fall.   Appears there is an order in from gen cards, will see if scheduling team can set this up or route to appropriate team to set up.

## 2023-11-20 NOTE — Telephone Encounter (Signed)
 Pt called back and would like a call back.

## 2023-11-21 NOTE — Telephone Encounter (Signed)
 Spoke w/ patient - he is scheduled for all appts.  Dr. Waddell for his yearly f/u - 12/10 Echo per Dr. Santo - 3/2  Dr. Santo for his yearly f/u - 3/17

## 2023-11-22 NOTE — Progress Notes (Signed)
 Remote ICD Transmission

## 2023-11-26 ENCOUNTER — Ambulatory Visit

## 2023-11-26 DIAGNOSIS — I255 Ischemic cardiomyopathy: Secondary | ICD-10-CM

## 2023-11-26 LAB — CUP PACEART REMOTE DEVICE CHECK
Battery Remaining Longevity: 26 mo
Battery Remaining Percentage: 28 %
Battery Voltage: 2.86 V
Brady Statistic AP VP Percent: 40 %
Brady Statistic AP VS Percent: 1 %
Brady Statistic AS VP Percent: 59 %
Brady Statistic AS VS Percent: 1 %
Brady Statistic RA Percent Paced: 41 %
Date Time Interrogation Session: 20251013020018
HighPow Impedance: 79 Ohm
HighPow Impedance: 79 Ohm
Implantable Lead Connection Status: 753985
Implantable Lead Connection Status: 753985
Implantable Lead Connection Status: 753985
Implantable Lead Implant Date: 20181115
Implantable Lead Implant Date: 20181115
Implantable Lead Implant Date: 20181115
Implantable Lead Location: 753858
Implantable Lead Location: 753859
Implantable Lead Location: 753860
Implantable Lead Model: 7122
Implantable Pulse Generator Implant Date: 20181115
Lead Channel Impedance Value: 450 Ohm
Lead Channel Impedance Value: 460 Ohm
Lead Channel Impedance Value: 650 Ohm
Lead Channel Pacing Threshold Amplitude: 0.5 V
Lead Channel Pacing Threshold Amplitude: 0.625 V
Lead Channel Pacing Threshold Amplitude: 0.75 V
Lead Channel Pacing Threshold Pulse Width: 0.5 ms
Lead Channel Pacing Threshold Pulse Width: 0.5 ms
Lead Channel Pacing Threshold Pulse Width: 0.5 ms
Lead Channel Sensing Intrinsic Amplitude: 11.7 mV
Lead Channel Sensing Intrinsic Amplitude: 3.6 mV
Lead Channel Setting Pacing Amplitude: 1.5 V
Lead Channel Setting Pacing Amplitude: 2 V
Lead Channel Setting Pacing Amplitude: 2 V
Lead Channel Setting Pacing Pulse Width: 0.5 ms
Lead Channel Setting Pacing Pulse Width: 0.5 ms
Lead Channel Setting Sensing Sensitivity: 0.5 mV
Pulse Gen Serial Number: 9780956
Zone Setting Status: 755011

## 2023-11-27 NOTE — Progress Notes (Signed)
 Remote ICD Transmission

## 2023-11-27 NOTE — Progress Notes (Deleted)
 Remote {Blank single:19197::PPM,ICD,Loop Recorder} Transmission

## 2023-11-29 ENCOUNTER — Ambulatory Visit: Payer: Self-pay | Admitting: Internal Medicine

## 2023-12-12 ENCOUNTER — Telehealth: Payer: Self-pay

## 2023-12-12 ENCOUNTER — Other Ambulatory Visit: Payer: Self-pay

## 2023-12-12 DIAGNOSIS — D696 Thrombocytopenia, unspecified: Secondary | ICD-10-CM

## 2023-12-12 DIAGNOSIS — D751 Secondary polycythemia: Secondary | ICD-10-CM

## 2023-12-12 DIAGNOSIS — D72819 Decreased white blood cell count, unspecified: Secondary | ICD-10-CM

## 2023-12-12 NOTE — Telephone Encounter (Signed)
 Spoke with patient and confirmed appointment on 10/30

## 2023-12-13 ENCOUNTER — Inpatient Hospital Stay

## 2023-12-13 ENCOUNTER — Encounter: Payer: Self-pay | Admitting: Hematology and Oncology

## 2023-12-13 ENCOUNTER — Inpatient Hospital Stay: Attending: Hematology and Oncology | Admitting: Hematology and Oncology

## 2023-12-13 VITALS — BP 118/58 | HR 62 | Temp 97.8°F | Resp 17 | Wt 207.9 lb

## 2023-12-13 DIAGNOSIS — D751 Secondary polycythemia: Secondary | ICD-10-CM | POA: Diagnosis present

## 2023-12-13 DIAGNOSIS — K635 Polyp of colon: Secondary | ICD-10-CM | POA: Insufficient documentation

## 2023-12-13 DIAGNOSIS — D696 Thrombocytopenia, unspecified: Secondary | ICD-10-CM | POA: Diagnosis not present

## 2023-12-13 DIAGNOSIS — K921 Melena: Secondary | ICD-10-CM | POA: Diagnosis not present

## 2023-12-13 DIAGNOSIS — D72819 Decreased white blood cell count, unspecified: Secondary | ICD-10-CM

## 2023-12-13 DIAGNOSIS — Z8049 Family history of malignant neoplasm of other genital organs: Secondary | ICD-10-CM | POA: Diagnosis not present

## 2023-12-13 LAB — CBC WITH DIFFERENTIAL (CANCER CENTER ONLY)
Abs Immature Granulocytes: 0.02 K/uL (ref 0.00–0.07)
Basophils Absolute: 0 K/uL (ref 0.0–0.1)
Basophils Relative: 1 %
Eosinophils Absolute: 0.1 K/uL (ref 0.0–0.5)
Eosinophils Relative: 3 %
HCT: 46.5 % (ref 39.0–52.0)
Hemoglobin: 15.7 g/dL (ref 13.0–17.0)
Immature Granulocytes: 1 %
Lymphocytes Relative: 20 %
Lymphs Abs: 0.9 K/uL (ref 0.7–4.0)
MCH: 27.9 pg (ref 26.0–34.0)
MCHC: 33.8 g/dL (ref 30.0–36.0)
MCV: 82.6 fL (ref 80.0–100.0)
Monocytes Absolute: 0.5 K/uL (ref 0.1–1.0)
Monocytes Relative: 11 %
Neutro Abs: 2.9 K/uL (ref 1.7–7.7)
Neutrophils Relative %: 64 %
Platelet Count: 107 K/uL — ABNORMAL LOW (ref 150–400)
RBC: 5.63 MIL/uL (ref 4.22–5.81)
RDW: 15.6 % — ABNORMAL HIGH (ref 11.5–15.5)
WBC Count: 4.4 K/uL (ref 4.0–10.5)
nRBC: 0 % (ref 0.0–0.2)

## 2023-12-13 LAB — CMP (CANCER CENTER ONLY)
ALT: 21 U/L (ref 0–44)
AST: 20 U/L (ref 15–41)
Albumin: 4.2 g/dL (ref 3.5–5.0)
Alkaline Phosphatase: 45 U/L (ref 38–126)
Anion gap: 8 (ref 5–15)
BUN: 23 mg/dL (ref 8–23)
CO2: 27 mmol/L (ref 22–32)
Calcium: 9.6 mg/dL (ref 8.9–10.3)
Chloride: 106 mmol/L (ref 98–111)
Creatinine: 1.31 mg/dL — ABNORMAL HIGH (ref 0.61–1.24)
GFR, Estimated: 58 mL/min — ABNORMAL LOW (ref >60)
Glucose, Bld: 171 mg/dL — ABNORMAL HIGH (ref 70–99)
Potassium: 4 mmol/L (ref 3.5–5.1)
Sodium: 141 mmol/L (ref 135–145)
Total Bilirubin: 0.5 mg/dL (ref 0.0–1.2)
Total Protein: 7.3 g/dL (ref 6.5–8.1)

## 2023-12-13 NOTE — Progress Notes (Unsigned)
 [facility] CONSULT NOTE  Patient Care Team: Charlott Dorn LABOR, MD as PCP - General (Internal Medicine) Shlomo Wilbert SAUNDERS, MD as PCP - Sleep Medicine (Cardiology) Waddell Danelle ORN, MD as PCP - Electrophysiology (Cardiology) Santo Stanly LABOR, MD as PCP - Cardiology (Cardiology) Obadiah Coy, MD as Attending Physician (Cardiothoracic Surgery)  CHIEF COMPLAINTS/PURPOSE OF CONSULTATION:  FU about polycythemia and thrombocytopenia.  ASSESSMENT & PLAN:   This is a very pleasant 73 year old male patient with past medical history significant for coronary artery disease status post CABG, hypertension, dyslipidemia referred to hematology for evaluation of cytopenias.  Assessment and Plan Assessment & Plan Leukopenia and thrombocytopenia White blood cell count normalized, possibly due to recent sinus infection. Hemoglobin stable and normal. Platelet count stable between 80 and 100 for two years. He prefers surveillance. We have in the past discussed about doing a BMB.  Rectal bleeding with colon polyps Intermittent rectal bleeding. Previous polyps removed 3-4 years ago. Differential includes polyp recurrence or other causes. - Complete stool card test for blood in stool. - If positive, follow up with GI for potential colonoscopy.  General Health Maintenance Received flu shot last week.  Follow-up Continued monitoring with regular follow-up appointments. - Schedule follow-up appointment in six months. - Contact provider if significant health changes occur.   HISTORY OF PRESENTING ILLNESS:   Kenneth Giles 73 y.o. male is here because of polycythemia.   Interval history  Discussed the use of AI scribe software for clinical note transcription with the patient, who gave verbal consent to proceed. History of Present Illness  Kenneth Giles is a 73 year old male who presents for a follow-up visit regarding his hematological status and recent  symptoms.  He reports stable health overall with no significant changes since his last visit. He managed two trips over the summer, including travel to [place]  and [place] , and found the travel tiring but manageable.  He notes a fluctuation in his weight, having lost and gained a few pounds, but generally maintaining a stable range. His appetite remains good.  He experiences occasional gastrointestinal symptoms, including food-related upset and intermittent hematochezia. A colonoscopy performed three to four years ago revealed polyps, which were removed. He has not yet completed a follow-up stool test due to scheduling conflicts.  No fevers or drenching night sweats. He mentions a possible sinus infection during a recent visit to [place], which may have affected his white blood cell count.  His current medications remain unchanged.   Rest of the pertinent 10 point ROS reviewed and neg  MEDICAL HISTORY:  Past Medical History:  Diagnosis Date   AICD (automatic cardioverter/defibrillator) present 12/28/2016   St Jude placed by dr g. taylor for ICM/ CHF   Allergic rhinitis, seasonal    Benign localized prostatic hyperplasia with lower urinary tract symptoms (LUTS)    CAD (coronary artery disease)    primary cardiology--- dr h. claudene;  a. 2014 CABG x 4 (L->LAD, V->Diag, V->OM, Rad->RCA);  b. 03/2013 Cath: LM nl, LAD 80p, LCX 66m, OM1 100, RCA 70-53m, all grafts patent, EF 40%.   Chronic low back pain    Chronic systolic CHF (congestive heart failure) (HCC)    followed by cardiology   CKD (chronic kidney disease), stage III (HCC)    Constipation, chronic    Deviated nasal septum    followed by dr mable---  severe   Diverticulosis of colon    Family history of adverse reaction to anesthesia  sister--- ponv   GERD (gastroesophageal reflux disease)    History of colon polyps    History of kidney stones    Hypertension    followed by cardiology and pcp   Infection of  great toe 04/12/2021   left ,  per pt due to ingrown toe nail   Ischemic cardiomyopathy    followed by dr h. claudene;;  a. 03/2013 Echo: EF 25-30%, diff HK, antsep/apical AK, Gr2 DD, Ao sclerosis, mild AI, PASP 36.;    last echo in epic 02-02-2020 , ef 60-65% with mild septal/ inferior hypokinesis G1DD, mild AR no stenosis   Left bundle branch block    Lipoma of back    Malignant neoplasm prostate [facility])    urologist-- dr newsome/  radiation oncology-- dr patrcia;  dx 10/ 2022,  Gleason 4=%, PSA 7.84   Mild aortic valve regurgitation    per last echo in epic 01-2020  w/ mild calcification/ thickening but no sclerosis / stenosis   Mixed hyperlipidemia    takes Lipitor  daily   OA (osteoarthritis)    hands   OSA (obstructive sleep apnea)    dx'd; having another sleep study later this month; never followed up on getting a mask (12/28/2016)   OSA on CPAP    followed by dr t. turner--- per study in epic 01-11-2017, Moderate OSA ( AHI of 23.1/hr with O2 desats as low as 81%.   Peripheral edema    Peripheral neuropathy    Polycythemia, secondary    followed by oncologist-- dr myrtis stalls,  per last note in epic 02-03-2021 secondary to OSA and noncompliant with cpap   S/P CABG x 4 08/23/2012   LIMA-- LAD,  SVG --Diag,  SVG -- OM,  rad -- RCA   Thrombocytopenia    followed by oncology/ hematology--- dr shaunna. Torrie Namba ([facility]),  hx phlebotomy   Type II diabetes mellitus (HCC)    followed by pcp    (04-14-2021  pt stated checks blood sugar daily in am,  fasting sugar-- 90-150)   Wears hearing aid in both ears     SURGICAL HISTORY: Past Surgical History:  Procedure Laterality Date   BIV ICD INSERTION CRT-D N/A 12/28/2016   Procedure: BIV ICD INSERTION CRT-D;  Surgeon: Waddell Danelle ORN, MD;  Location: [facility] INVASIVE CV LAB;  Service: Cardiovascular;  Laterality: N/A;   COLONOSCOPY     CORONARY ARTERY BYPASS GRAFT N/A 08/23/2012   Procedure: CORONARY ARTERY BYPASS GRAFTING times four using Right  Greater Saphenous Vein Graft harvested endoscopically and Left Radail Artery Graft;  Surgeon: Maude Fleeta Ochoa, MD;  Location: [facility] OR;  Service: Open Heart Surgery;  Laterality: N/A;   GOLD SEED IMPLANT N/A 04/19/2021   Procedure: GOLD SEED IMPLANT;  Surgeon: Rosalind Zachary NOVAK, MD;  Location: [facility];  Service: Urology;  Laterality: N/A;  30 MINS FOR THIS CASE   INTRAOPERATIVE TRANSESOPHAGEAL ECHOCARDIOGRAM N/A 08/23/2012   Procedure: INTRAOPERATIVE TRANSESOPHAGEAL ECHOCARDIOGRAM;  Surgeon: Maude Fleeta Ochoa, MD;  Location: [facility] OR;  Service: Open Heart Surgery;  Laterality: N/A;   KNEE ARTHROSCOPY Right 2013   LEFT HEART CATHETERIZATION WITH CORONARY ANGIOGRAM N/A 08/15/2012   Procedure: LEFT HEART CATHETERIZATION WITH CORONARY ANGIOGRAM;  Surgeon: Victory ORN Claudene DOUGLAS, MD;  Location: [facility] CATH LAB;  Service: Cardiovascular;  Laterality: N/A;   LEFT HEART CATHETERIZATION WITH CORONARY ANGIOGRAM N/A 04/04/2013   Procedure: LEFT HEART CATHETERIZATION WITH CORONARY ANGIOGRAM;  Surgeon: Debby DELENA Sor, MD;  Location: [facility] CATH LAB;  Service: Cardiovascular;  Laterality: N/A;   ORIF TIBIA PLATEAU Left 10/16/2008   @ [facility];   AND ORIF LEFT HUMERUS SHAFT   RADIAL ARTERY HARVEST Left 08/23/2012   Procedure: RADIAL ARTERY HARVEST;  Surgeon: Maude Fleeta Ochoa, MD;  Location[facility] OR;  Service: Open Heart Surgery;  Laterality: Left;   SPACE OAR INSTILLATION N/A 04/19/2021   Procedure: SPACE OAR INSTILLATION;  Surgeon: Rosalind Zachary NOVAK, MD;  Location: [facility];  Service: Urology;  Laterality: N/A;    SOCIAL HISTORY: Social History   Socioeconomic History   Marital status: Widowed    Spouse name: Not on file   Number of children: Not on file   Years of education: Not on file   Highest education level: Not on file  Occupational History   Occupation: surveyor, 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, fire and burglar alarms    Tobacco Use   Smoking status: Never   Smokeless tobacco: Never   Vaping Use   Vaping status: Never Used  Substance and Sexual Activity   Alcohol use: Yes    Comment: seldom   Drug use: No   Sexual activity: Not on file  Other Topics Concern   Not on file  Social History Narrative   Not on file   Social Drivers of Health   Financial Resource Strain: Not on file  Food Insecurity: Not on file  Transportation Needs: Not on file  Physical Activity: Not on file  Stress: Not on file  Social Connections: Not on file  Intimate Partner Violence: Not on file    FAMILY HISTORY: Family History  Problem Relation Age of Onset   Hypertension Mother    Hyperlipidemia Mother    Cancer Mother        uterine   Anemia Mother    Fibromyalgia Sister    Heart attack Paternal Uncle    Stroke Paternal Aunt     ALLERGIES:  is allergic to metformin hcl, procaine, rosuvastatin, spironolactone , and tylenol  with codeine #3 [acetaminophen -codeine].  MEDICATIONS:  Current Outpatient Medications  Medication Sig Dispense Refill   aspirin  EC 81 MG tablet Take 1 tablet (81 mg total) by mouth daily. 30 tablet 11   atorvastatin  (LIPITOR ) 40 MG tablet Take 40 mg by mouth daily at 6 PM.     B Complex CAPS Take 1 capsule by mouth daily.     carvedilol  (COREG ) 25 MG tablet TAKE 1 TABLET BY MOUTH TWICE A DAY 180 tablet 3   cetirizine (ZYRTEC) 10 MG tablet Take 10 mg by mouth at bedtime.     empagliflozin (JARDIANCE) 25 MG TABS tablet Take 1 tablet by mouth daily.     famotidine  (PEPCID ) 40 MG tablet Take 1 tablet by mouth at bedtime.     fenofibrate  micronized (LOFIBRA) 200 MG capsule Take 200 mg by mouth daily before breakfast.     fluticasone (FLONASE) 50 MCG/ACT nasal spray 2 sprays at bedtime.     glimepiride  (AMARYL ) 1 MG tablet Take 1 mg by mouth at bedtime.     lisinopril  (ZESTRIL ) 40 MG tablet TAKE 1 TABLET BY MOUTH EVERY DAY 90 tablet 3   Misc Natural Products (GLUCOSAMINE-CHONDROITIN PLUS) TABS Take 1 tablet by mouth 2 (two) times daily.     Multiple  Vitamins-Minerals (ONE-A-DAY MENS 50+) TABS Take by mouth daily.     naproxen  sodium (ALEVE ) 220 MG tablet Take 220 mg by mouth daily as needed (pain).     nitroGLYCERIN  (NITROSTAT ) 0.4  MG SL tablet Place 1 tablet (0.4 mg total) under the tongue every 5 (five) minutes as needed for chest pain. 25 tablet 3   OMEGA 3 1000 MG CAPS Take 1 capsule by mouth daily.     potassium chloride  SA (K-DUR,KLOR-CON ) 20 MEQ tablet Take 20 mEq by mouth 2 (two) times daily. Take an extra dose daily when take an extra dose of torsemide      saccharomyces boulardii (FLORASTOR) 250 MG capsule 2 (two) times daily.     sodium chloride  (OCEAN) 0.65 % SOLN nasal spray Place 2 sprays into both nostrils in the morning and at bedtime.  0   torsemide  (DEMADEX ) 20 MG tablet TAKE 1 TAB BY MOUTH DAILY MAY TAKE AN EXTRA DOSE DAILY AS NEEDED FOR SWELLING/ SHORTNESS OF BREATH 90 tablet 3   vitamin C  (ASCORBIC ACID ) 500 MG tablet Take 500 mg 2 (two) times daily by mouth.      No current facility-administered medications for this visit.   PHYSICAL EXAMINATION:  ECOG PERFORMANCE STATUS: 0 - Asymptomatic  Vitals:   12/13/23 1329  BP: (!) 118/58  Pulse: 62  Resp: 17  Temp: 97.8 F (36.6 C)  SpO2: 98%    Filed Weights   12/13/23 1329  Weight: 207 lb 14.4 oz (94.3 kg)   Physical Exam Constitutional:      Appearance: Normal appearance.  HENT:     Head: Normocephalic and atraumatic.  Cardiovascular:     Rate and Rhythm: Normal rate and regular rhythm.  Pulmonary:     Effort: Pulmonary effort is normal.     Breath sounds: Normal breath sounds.  Abdominal:     General: Abdomen is flat.  Musculoskeletal:        General: Normal range of motion.     Cervical back: Normal range of motion and neck supple.  Skin:    General: Skin is warm.  Neurological:     General: No focal deficit present.     Mental Status: He is alert.      LABORATORY DATA:  I have reviewed the data as listed Lab Results  Component Value Date    WBC 4.4 12/13/2023   HGB 15.7 12/13/2023   HCT 46.5 12/13/2023   MCV 82.6 12/13/2023   PLT 107 (L) 12/13/2023     Chemistry      Component Value Date/Time   NA 139 06/12/2023 1119   NA 144 05/24/2022 1310   K 3.8 06/12/2023 1119   CL 106 06/12/2023 1119   CO2 26 06/12/2023 1119   BUN 25 (H) 06/12/2023 1119   BUN 23 05/24/2022 1310   CREATININE 1.47 (H) 06/12/2023 1119      Component Value Date/Time   CALCIUM  9.1 06/12/2023 1119   ALKPHOS 49 06/12/2023 1119   AST 24 06/12/2023 1119   ALT 29 06/12/2023 1119   BILITOT 0.5 06/12/2023 1119       RADIOGRAPHIC STUDIES: I have personally reviewed the radiological images as listed and agreed with the findings in the report. CUP PACEART REMOTE DEVICE CHECK Result Date: 11/26/2023 ICD Scheduled remote reviewed. Normal device function.  Presenting rhythm:  AS/AP-BIV paced.  1 AHR detection lasting 8 seconds. HF diagnostics have been abnormal in this monitoring period. Next remote transmission per protocol. - CS, CVRS  Total time spent: 20 minutes including history, physical exam, review of records, counseling and coordination of care   Amber Stalls, MD 12/13/2023 1:33 PM

## 2023-12-14 ENCOUNTER — Encounter: Payer: Self-pay | Admitting: Hematology and Oncology

## 2024-01-15 ENCOUNTER — Ambulatory Visit: Admitting: Podiatry

## 2024-01-15 ENCOUNTER — Encounter: Payer: Self-pay | Admitting: Podiatry

## 2024-01-15 DIAGNOSIS — M79675 Pain in left toe(s): Secondary | ICD-10-CM

## 2024-01-15 DIAGNOSIS — B351 Tinea unguium: Secondary | ICD-10-CM

## 2024-01-15 DIAGNOSIS — M79674 Pain in right toe(s): Secondary | ICD-10-CM

## 2024-01-15 DIAGNOSIS — E1159 Type 2 diabetes mellitus with other circulatory complications: Secondary | ICD-10-CM

## 2024-01-15 NOTE — Progress Notes (Signed)
 This patient returns to my office for at risk foot care.  This patient requires this care by a professional since this patient will be at risk due to having diabetes and thrombocytopenia.  This patient is unable to cut nails himself since the patient cannot reach his nails.These nails are painful walking and wearing shoes.  This patient presents for at risk foot care today.  General Appearance  Alert, conversant and in no acute stress.  Vascular  Dorsalis pedis and posterior tibial  pulses are palpable  bilaterally.  Capillary return is within normal limits  bilaterally. Temperature is within normal limits  bilaterally.  Neurologic  Senn-Weinstein monofilament wire test within normal limits  bilaterally. Muscle power within normal limits bilaterally.  Nails Thick disfigured discolored nails with subungual debris  from hallux to fifth toes bilaterally. No evidence of bacterial infection or drainage bilaterally.   Orthopedic  No limitations of motion  feet .  No crepitus or effusions noted.  No bony pathology or digital deformities noted.  Skin  normotropic skin with no porokeratosis noted bilaterally.  No signs of infections or ulcers noted.    Onychomycosis  Pain in right toes  Pain in left toes  Consent was obtained for treatment procedures.   Mechanical debridement of nails 1-5  bilaterally performed with a nail nipper.  Filed  with dremel tool.   Return office visit    10 weeks                 Told patient to return for periodic foot care and evaluation due to potential at risk complications.   Helane Gunther DPM

## 2024-01-23 ENCOUNTER — Ambulatory Visit: Attending: Cardiology | Admitting: Internal Medicine

## 2024-01-23 ENCOUNTER — Encounter: Payer: Self-pay | Admitting: Internal Medicine

## 2024-01-23 VITALS — BP 118/72 | HR 62 | Ht 66.0 in | Wt 216.6 lb

## 2024-01-23 DIAGNOSIS — I2510 Atherosclerotic heart disease of native coronary artery without angina pectoris: Secondary | ICD-10-CM | POA: Insufficient documentation

## 2024-01-23 DIAGNOSIS — I359 Nonrheumatic aortic valve disorder, unspecified: Secondary | ICD-10-CM | POA: Diagnosis present

## 2024-01-23 DIAGNOSIS — I5023 Acute on chronic systolic (congestive) heart failure: Secondary | ICD-10-CM | POA: Diagnosis present

## 2024-01-23 DIAGNOSIS — I255 Ischemic cardiomyopathy: Secondary | ICD-10-CM | POA: Diagnosis present

## 2024-01-23 DIAGNOSIS — Z95810 Presence of automatic (implantable) cardiac defibrillator: Secondary | ICD-10-CM | POA: Diagnosis present

## 2024-01-23 DIAGNOSIS — I5022 Chronic systolic (congestive) heart failure: Secondary | ICD-10-CM | POA: Diagnosis present

## 2024-01-23 DIAGNOSIS — I10 Essential (primary) hypertension: Secondary | ICD-10-CM | POA: Insufficient documentation

## 2024-01-23 LAB — CUP PACEART INCLINIC DEVICE CHECK
Battery Remaining Longevity: 21 mo
Brady Statistic RA Percent Paced: 40 %
Brady Statistic RV Percent Paced: 99.26 %
Date Time Interrogation Session: 20251210111830
HighPow Impedance: 78.75 Ohm
Implantable Lead Connection Status: 753985
Implantable Lead Connection Status: 753985
Implantable Lead Connection Status: 753985
Implantable Lead Implant Date: 20181115
Implantable Lead Implant Date: 20181115
Implantable Lead Implant Date: 20181115
Implantable Lead Location: 753858
Implantable Lead Location: 753859
Implantable Lead Location: 753860
Implantable Lead Model: 7122
Implantable Pulse Generator Implant Date: 20181115
Lead Channel Impedance Value: 450 Ohm
Lead Channel Impedance Value: 475 Ohm
Lead Channel Impedance Value: 650 Ohm
Lead Channel Pacing Threshold Amplitude: 0.5 V
Lead Channel Pacing Threshold Amplitude: 0.625 V
Lead Channel Pacing Threshold Amplitude: 0.75 V
Lead Channel Pacing Threshold Pulse Width: 0.5 ms
Lead Channel Pacing Threshold Pulse Width: 0.5 ms
Lead Channel Pacing Threshold Pulse Width: 0.5 ms
Lead Channel Sensing Intrinsic Amplitude: 11.7 mV
Lead Channel Sensing Intrinsic Amplitude: 4.3 mV
Lead Channel Setting Pacing Amplitude: 1.5 V
Lead Channel Setting Pacing Amplitude: 2 V
Lead Channel Setting Pacing Amplitude: 2 V
Lead Channel Setting Pacing Pulse Width: 0.5 ms
Lead Channel Setting Pacing Pulse Width: 0.5 ms
Lead Channel Setting Sensing Sensitivity: 0.5 mV
Pulse Gen Serial Number: 9780956
Zone Setting Status: 755011

## 2024-01-23 NOTE — Patient Instructions (Signed)

## 2024-01-23 NOTE — Progress Notes (Signed)
 HPI [name] returns today for ongoing followup. He has chronic systolic heart failure, s/p MI, LBBB, s/p Biv ICD insertion. He denies chest pain or sob. He has class 2 symptoms. He admits to being sedentary. He admits to dietary indiscretion. He has not been in the hospital. He rides his 3 wheel motorcycle frequently. No accidents.  Allergies  Allergen Reactions   Metformin Hcl Diarrhea   Procaine Other (See Comments)    Other reaction(s): not effective   Rosuvastatin     Foot pain and hip pain   Spironolactone  Nausea Only   Tylenol  With Codeine #3 [Acetaminophen -Codeine] Nausea Only    Per pt can take tylenol  no issues     Current Outpatient Medications  Medication Sig Dispense Refill   aspirin  EC 81 MG tablet Take 1 tablet (81 mg total) by mouth daily. 30 tablet 11   atorvastatin  (LIPITOR ) 40 MG tablet Take 40 mg by mouth daily at 6 PM.     B Complex CAPS Take 1 capsule by mouth daily.     carvedilol  (COREG ) 25 MG tablet TAKE 1 TABLET BY MOUTH TWICE A DAY 180 tablet 3   cetirizine (ZYRTEC) 10 MG tablet Take 10 mg by mouth at bedtime.     empagliflozin (JARDIANCE) 25 MG TABS tablet Take 1 tablet by mouth daily.     famotidine  (PEPCID ) 40 MG tablet Take 1 tablet by mouth at bedtime.     fenofibrate  micronized (LOFIBRA) 200 MG capsule Take 200 mg by mouth daily before breakfast.     fluticasone (FLONASE) 50 MCG/ACT nasal spray 2 sprays at bedtime.     glimepiride  (AMARYL ) 1 MG tablet Take 1 mg by mouth at bedtime.     lisinopril  (ZESTRIL ) 40 MG tablet TAKE 1 TABLET BY MOUTH EVERY DAY 90 tablet 3   Misc Natural Products (GLUCOSAMINE-CHONDROITIN PLUS) TABS Take 1 tablet by mouth 2 (two) times daily.     Multiple Vitamins-Minerals (ONE-A-DAY MENS 50+) TABS Take by mouth daily.     naproxen  sodium (ALEVE ) 220 MG tablet Take 220 mg by mouth daily as needed (pain).     nitroGLYCERIN  (NITROSTAT ) 0.4 MG SL tablet Place 1 tablet (0.4 mg total) under the tongue every 5 (five) minutes  as needed for chest pain. 25 tablet 3   OMEGA 3 1000 MG CAPS Take 1 capsule by mouth daily.     potassium chloride  SA (K-DUR,KLOR-CON ) 20 MEQ tablet Take 20 mEq by mouth 2 (two) times daily. Take an extra dose daily when take an extra dose of torsemide      saccharomyces boulardii (FLORASTOR) 250 MG capsule 2 (two) times daily.     sodium chloride  (OCEAN) 0.65 % SOLN nasal spray Place 2 sprays into both nostrils in the morning and at bedtime.  0   torsemide  (DEMADEX ) 20 MG tablet TAKE 1 TAB BY MOUTH DAILY MAY TAKE AN EXTRA DOSE DAILY AS NEEDED FOR SWELLING/ SHORTNESS OF BREATH 90 tablet 3   vitamin C  (ASCORBIC ACID ) 500 MG tablet Take 500 mg 2 (two) times daily by mouth.      No current facility-administered medications for this visit.     Past Medical History:  Diagnosis Date   AICD (automatic cardioverter/defibrillator) present 12/28/2016   St Jude placed by Kenneth g. Kenneth Giles for ICM/ CHF   Allergic rhinitis, seasonal    Benign localized prostatic hyperplasia with lower urinary tract symptoms (LUTS)    CAD (coronary artery disease)    primary cardiology--- Kenneth  Kenneth Giles;  a. 2014 CABG x 4 (L->LAD, V->Diag, V->OM, Rad->RCA);  b. 03/2013 Cath: LM nl, LAD 80p, LCX 9m, OM1 100, RCA 70-71m, all grafts patent, EF 40%.   Chronic low back pain    Chronic systolic CHF (congestive heart failure) (HCC)    followed by cardiology   CKD (chronic kidney disease), stage III (HCC)    Constipation, chronic    Deviated nasal septum    followed by Kenneth Kenneth Giles---  severe   Diverticulosis of colon    Family history of adverse reaction to anesthesia    Kenneth Giles--- Kenneth Giles   GERD (gastroesophageal reflux disease)    History of colon polyps    History of kidney stones    Hypertension    followed by cardiology and pcp   Infection of great toe 04/12/2021   left ,  per pt due to ingrown toe nail   Ischemic cardiomyopathy    followed by Kenneth Kenneth Giles;;  a. 03/2013 Echo: EF 25-30%, diff HK, antsep/apical AK, Gr2 DD, Ao  sclerosis, mild AI, PASP 36.;    last echo in epic 02-02-2020 , ef 60-65% with mild septal/ inferior hypokinesis G1DD, mild AR no stenosis   Left bundle branch block    Lipoma of back    Malignant neoplasm prostate [facility])    urologist-- Kenneth Giles/  radiation oncology-- Kenneth Giles;  dx 10/ 2022,  Gleason 4=%, PSA 7.84   Mild aortic valve regurgitation    per last echo in epic 01-2020  w/ mild calcification/ thickening but no sclerosis / stenosis   Mixed hyperlipidemia    takes Lipitor  daily   OA (osteoarthritis)    hands   OSA (obstructive sleep apnea)    dx'd; having another sleep study later this month; never followed up on getting a mask (12/28/2016)   OSA on CPAP    followed by Kenneth Giles--- per study in epic 01-11-2017, Moderate OSA ( AHI of 23.1/hr with O2 desats as low as 81%.   Peripheral edema    Peripheral neuropathy    Polycythemia, secondary    followed by oncologist-- Kenneth Giles,  per last note in epic 02-03-2021 secondary to OSA and noncompliant with cpap   S/P CABG x 4 08/23/2012   LIMA-- LAD,  SVG --Diag,  SVG -- OM,  rad -- RCA   Thrombocytopenia    followed by oncology/ hematology--- Kenneth Giles ([facility]),  hx phlebotomy   Type II diabetes mellitus (HCC)    followed by pcp    (04-14-2021  pt stated checks blood sugar daily in am,  fasting sugar-- 90-150)   Wears hearing aid in both ears     ROS:   All systems reviewed and negative except as noted in the HPI.   Past Surgical History:  Procedure Laterality Date   BIV ICD INSERTION CRT-D N/A 12/28/2016   Procedure: BIV ICD INSERTION CRT-D;  Surgeon: Kenneth Giles ORN, Giles;  Location: [facility] INVASIVE CV LAB;  Service: Cardiovascular;  Laterality: N/A;   COLONOSCOPY     CORONARY ARTERY BYPASS GRAFT N/A 08/23/2012   Procedure: CORONARY ARTERY BYPASS GRAFTING times four using Right Greater Saphenous Vein Graft harvested endoscopically and Left Radail Artery Graft;  Surgeon: Kenneth Fleeta Ochoa, Giles;  Location: [facility]  OR;  Service: Open Heart Surgery;  Laterality: N/A;   GOLD SEED IMPLANT N/A 04/19/2021   Procedure: GOLD SEED IMPLANT;  Surgeon: Rosalind Zachary NOVAK, Giles;  Location: [facility];  Service:  Urology;  Laterality: N/A;  30 MINS FOR THIS CASE   INTRAOPERATIVE TRANSESOPHAGEAL ECHOCARDIOGRAM N/A 08/23/2012   Procedure: INTRAOPERATIVE TRANSESOPHAGEAL ECHOCARDIOGRAM;  Surgeon: Kenneth Fleeta Ochoa, Giles;  Location: [facility] OR;  Service: Open Heart Surgery;  Laterality: N/A;   KNEE ARTHROSCOPY Right 2013   LEFT HEART CATHETERIZATION WITH CORONARY ANGIOGRAM N/A 08/15/2012   Procedure: LEFT HEART CATHETERIZATION WITH CORONARY ANGIOGRAM;  Surgeon: Victory LELON Giles DOUGLAS, Giles;  Location: [facility] CATH LAB;  Service: Cardiovascular;  Laterality: N/A;   LEFT HEART CATHETERIZATION WITH CORONARY ANGIOGRAM N/A 04/04/2013   Procedure: LEFT HEART CATHETERIZATION WITH CORONARY ANGIOGRAM;  Surgeon: Debby DELENA Sor, Giles;  Location: [facility] CATH LAB;  Service: Cardiovascular;  Laterality: N/A;   ORIF TIBIA PLATEAU Left 10/16/2008   @ [facility];   AND ORIF LEFT HUMERUS SHAFT   RADIAL ARTERY HARVEST Left 08/23/2012   Procedure: RADIAL ARTERY HARVEST;  Surgeon: Kenneth Fleeta Ochoa, Giles;  Location: [facility] OR;  Service: Open Heart Surgery;  Laterality: Left;   SPACE OAR INSTILLATION N/A 04/19/2021   Procedure: SPACE OAR INSTILLATION;  Surgeon: Rosalind Zachary NOVAK, Giles;  Location: [facility];  Service: Urology;  Laterality: N/A;     Family History  Problem Relation Age of Onset   Hypertension Mother    Hyperlipidemia Mother    Cancer Mother        uterine   Anemia Mother    Fibromyalgia Kenneth Giles    Heart attack Paternal Uncle    Stroke Paternal Aunt      Social History   Socioeconomic History   Marital status: Widowed    Spouse name: Not on file   Number of children: Not on file   Years of education: Not on file   Highest education level: Not on file  Occupational History   Occupation: surveyor, 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,  fire and burglar alarms    Tobacco Use   Smoking status: Never   Smokeless tobacco: Never  Vaping Use   Vaping status: Never Used  Substance and Sexual Activity   Alcohol use: Yes    Comment: seldom   Drug use: No   Sexual activity: Not on file  Other Topics Concern   Not on file  Social History Narrative   Not on file   Social Drivers of Health   Financial Resource Strain: Not on file  Food Insecurity: Not on file  Transportation Needs: Not on file  Physical Activity: Not on file  Stress: Not on file  Social Connections: Not on file  Intimate Partner Violence: Not on file     BP 118/72   Pulse 62   Ht 5' 6 (1.676 m)   Wt 216 lb 9.6 oz (98.2 kg)   SpO2 95%   BMI 34.96 kg/m   Physical Exam:  Well appearing 73 yo NAD HEENT: Unremarkable Neck:  No JVD, no thyromegally Lymphatics:  No adenopathy Back:  No CVA tenderness Lungs:  Clear with no wheezes HEART:  Regular rate rhythm, no murmurs, no rubs, no clicks Abd:  soft, positive bowel sounds, no organomegally, no rebound, no guarding Ext:  2 plus pulses, no edema, no cyanosis, no clubbing Skin:  No rashes no nodules Neuro:  CN II through XII intact, motor grossly intact  EKG - nsr with biv pacing  DEVICE  Normal device function.  See PaceArt for details.   Assess/Plan:  ICD - his St. Jude biv ICD is working normally. We will follow. Chronic systolic heart  failure - his symptoms are class 2. He will continue his current meds. CAD - he denies anginal symptoms. He will continue his current meds.    Giles Damaria Stofko,Giles

## 2024-02-25 ENCOUNTER — Ambulatory Visit

## 2024-02-25 DIAGNOSIS — I255 Ischemic cardiomyopathy: Secondary | ICD-10-CM

## 2024-02-26 LAB — CUP PACEART REMOTE DEVICE CHECK
Battery Remaining Longevity: 25 mo
Battery Remaining Percentage: 28 %
Battery Voltage: 2.84 V
Brady Statistic AP VP Percent: 30 %
Brady Statistic AP VS Percent: 1 %
Brady Statistic AS VP Percent: 69 %
Brady Statistic AS VS Percent: 1 %
Brady Statistic RA Percent Paced: 30 %
Date Time Interrogation Session: 20260112020026
HighPow Impedance: 78 Ohm
HighPow Impedance: 78 Ohm
Implantable Lead Connection Status: 753985
Implantable Lead Connection Status: 753985
Implantable Lead Connection Status: 753985
Implantable Lead Implant Date: 20181115
Implantable Lead Implant Date: 20181115
Implantable Lead Implant Date: 20181115
Implantable Lead Location: 753858
Implantable Lead Location: 753859
Implantable Lead Location: 753860
Implantable Lead Model: 7122
Implantable Pulse Generator Implant Date: 20181115
Lead Channel Impedance Value: 460 Ohm
Lead Channel Impedance Value: 490 Ohm
Lead Channel Impedance Value: 650 Ohm
Lead Channel Pacing Threshold Amplitude: 0.5 V
Lead Channel Pacing Threshold Amplitude: 0.5 V
Lead Channel Pacing Threshold Amplitude: 0.75 V
Lead Channel Pacing Threshold Pulse Width: 0.5 ms
Lead Channel Pacing Threshold Pulse Width: 0.5 ms
Lead Channel Pacing Threshold Pulse Width: 0.5 ms
Lead Channel Sensing Intrinsic Amplitude: 11.7 mV
Lead Channel Sensing Intrinsic Amplitude: 3.9 mV
Lead Channel Setting Pacing Amplitude: 1.5 V
Lead Channel Setting Pacing Amplitude: 2 V
Lead Channel Setting Pacing Amplitude: 2 V
Lead Channel Setting Pacing Pulse Width: 0.5 ms
Lead Channel Setting Pacing Pulse Width: 0.5 ms
Lead Channel Setting Sensing Sensitivity: 0.5 mV
Pulse Gen Serial Number: 9780956
Zone Setting Status: 755011

## 2024-02-28 NOTE — Progress Notes (Signed)
 Remote ICD Transmission

## 2024-02-29 ENCOUNTER — Ambulatory Visit: Payer: Self-pay | Admitting: Cardiovascular Disease

## 2024-04-14 ENCOUNTER — Other Ambulatory Visit (HOSPITAL_COMMUNITY)

## 2024-04-15 ENCOUNTER — Ambulatory Visit: Admitting: Podiatry

## 2024-04-29 ENCOUNTER — Ambulatory Visit: Admitting: Internal Medicine

## 2024-05-26 ENCOUNTER — Encounter

## 2024-06-11 ENCOUNTER — Inpatient Hospital Stay: Admitting: Hematology and Oncology

## 2024-06-11 ENCOUNTER — Inpatient Hospital Stay
